# Patient Record
Sex: Female | Born: 1960 | Race: White | Hispanic: No | Marital: Married | Smoking: Never smoker
Health system: Southern US, Community
[De-identification: ages and names within clinical notes are randomized; demographics above are authoritative.]

## PROBLEM LIST (undated history)

## (undated) DIAGNOSIS — G2581 Restless legs syndrome: Secondary | ICD-10-CM

## (undated) DIAGNOSIS — Z87442 Personal history of urinary calculi: Secondary | ICD-10-CM

## (undated) DIAGNOSIS — B019 Varicella without complication: Secondary | ICD-10-CM

## (undated) DIAGNOSIS — N189 Chronic kidney disease, unspecified: Secondary | ICD-10-CM

## (undated) DIAGNOSIS — M549 Dorsalgia, unspecified: Secondary | ICD-10-CM

## (undated) DIAGNOSIS — K219 Gastro-esophageal reflux disease without esophagitis: Secondary | ICD-10-CM

## (undated) DIAGNOSIS — F419 Anxiety disorder, unspecified: Secondary | ICD-10-CM

## (undated) DIAGNOSIS — I10 Essential (primary) hypertension: Secondary | ICD-10-CM

## (undated) DIAGNOSIS — G8929 Other chronic pain: Secondary | ICD-10-CM

## (undated) DIAGNOSIS — E669 Obesity, unspecified: Secondary | ICD-10-CM

## (undated) DIAGNOSIS — F32A Depression, unspecified: Secondary | ICD-10-CM

## (undated) DIAGNOSIS — E785 Hyperlipidemia, unspecified: Secondary | ICD-10-CM

## (undated) DIAGNOSIS — F329 Major depressive disorder, single episode, unspecified: Secondary | ICD-10-CM

## (undated) HISTORY — DX: Chronic kidney disease, unspecified: N18.9

## (undated) HISTORY — DX: Hyperlipidemia, unspecified: E78.5

## (undated) HISTORY — PX: BACK SURGERY: SHX140

## (undated) HISTORY — PX: ABDOMINAL HYSTERECTOMY: SHX81

## (undated) HISTORY — PX: CHOLECYSTECTOMY: SHX55

---

## 2005-01-26 ENCOUNTER — Ambulatory Visit: Payer: Self-pay | Admitting: Unknown Physician Specialty

## 2005-04-20 ENCOUNTER — Ambulatory Visit: Payer: Self-pay | Admitting: Chiropractic Medicine

## 2005-10-17 ENCOUNTER — Inpatient Hospital Stay (HOSPITAL_COMMUNITY): Admission: RE | Admit: 2005-10-17 | Discharge: 2005-10-20 | Payer: Self-pay | Admitting: Neurosurgery

## 2007-06-11 ENCOUNTER — Ambulatory Visit: Payer: Self-pay | Admitting: Internal Medicine

## 2008-01-30 ENCOUNTER — Ambulatory Visit: Payer: Self-pay

## 2008-05-17 ENCOUNTER — Ambulatory Visit: Payer: Self-pay | Admitting: Gastroenterology

## 2008-05-28 ENCOUNTER — Emergency Department: Payer: Self-pay | Admitting: Emergency Medicine

## 2008-05-28 ENCOUNTER — Other Ambulatory Visit: Payer: Self-pay

## 2008-06-10 ENCOUNTER — Ambulatory Visit: Payer: Self-pay | Admitting: Urology

## 2008-06-16 ENCOUNTER — Ambulatory Visit: Payer: Self-pay | Admitting: Urology

## 2009-01-11 ENCOUNTER — Ambulatory Visit: Payer: Self-pay | Admitting: Internal Medicine

## 2009-01-26 ENCOUNTER — Emergency Department: Payer: Self-pay | Admitting: Emergency Medicine

## 2009-08-15 ENCOUNTER — Ambulatory Visit: Payer: Self-pay | Admitting: Chiropractic Medicine

## 2010-03-14 ENCOUNTER — Encounter: Admission: RE | Admit: 2010-03-14 | Discharge: 2010-03-14 | Payer: Self-pay | Admitting: Neurosurgery

## 2010-09-07 ENCOUNTER — Ambulatory Visit: Payer: Self-pay | Admitting: Orthopedic Surgery

## 2011-01-07 ENCOUNTER — Encounter: Payer: Self-pay | Admitting: Neurosurgery

## 2011-05-04 NOTE — Op Note (Signed)
NAMEMARIDEL, PIXLER              ACCOUNT NO.:  [id number]   MEDICAL RECORD NO.:  [id number]          PATIENT TYPE:  INP   LOCATION:  2899                         FACILITY:  [facility]   PHYSICIAN:  Kathaleen Maser. Pool, M.D.    DATE OF BIRTH:  [birth year]   DATE OF PROCEDURE:  10/17/2005  DATE OF DISCHARGE:                                 OPERATIVE REPORT   PREOPERATIVE DIAGNOSES:  L4-5 and L5-S1 degenerative disk disease with  associated stenosis.   POSTOPERATIVE DIAGNOSES:  L4-5 and L5-S1 degenerative disk disease with  associated stenosis.   OPERATION PERFORMED:  L4-5 and L5-S1 decompressive laminectomies with  foraminotomies, more than would be required for simple interbody fusion  alone.  L4-5 and L5-S1 posterior lumbar interbody fusion utilizing Telemon  cages and local autografting.  L4 through S1 posterolateral arthrodesis  utilizing segmental pedicle screw fixation and local autografting.   SURGEON:  Kathaleen Maser. Pool, M.D.   ASSISTANT:  Donalee Citrin, M.D.   ANESTHESIA:  General endotracheal.   INDICATIONS FOR PROCEDURE:  The patient is a 50 year old female with a  history of back and bilateral lower extremity pain, left greater than right  failing conservative management.  Work-up demonstrates evidence of marked  disk degeneration and stenosis at L4-5 and L5-S1.  The patient has failed  conservative management.  She decided to proceed with L4-5 decompression and  fusion and instrumentation in hopes of improving her symptoms.   DESCRIPTION OF PROCEDURE:  The patient was taken to the operating room and  placed on the table in the supine position.  After adequate level of  anesthesia was achieved, the patient was positioned prone onto a Wilson  frame and appropriately padded.  The patient's lumbar region was prepped and  draped sterilely.  A 10 blade was used to make a linear skin incision  overlying the L3, 4, 5, and S1 levels.  This was carried down sharply in the  midline.  A  subperiosteal dissection was then performed exposing the lamina  and facet joints of L3, L4, L5 and S1 as well as the transverse processes of  L4, L5 and the sacral ala bilaterally.  Deep self-retaining retractor was  placed.  Intraoperative fluoroscopy was used and the levels were confirmed.  Decompressive laminectomy was then performed at L4, L5 and S1 using Leksell  rongeurs, Kerrison rongeurs and a high speed drill to completely remove the  lamina of L4, completely remove the lamina of L5 and remove the superior  aspect of the lamina at S1.  All bone was cleaned and used in layer  autografting, complete inferior facetectomies at L4 and L5 were performed  bilaterally.  Complete superior facetectomies of L5 and S1 were performed  bilaterally.  Once again, all bone was cleaned and used in layer autograft.  The ligamentum flavum was then elevated and resected in piecemeal fashion  using Kerrison rongeurs.  The underlying thecal sac and exiting L4, L5 and  S1 nerve roots were identified.  Wide decompressive foraminotomy was then  performed along the course of the exiting nerve roots bilaterally.  The  epidural venous  plexus was coagulated and cut.  Starting first at the  patient's right side, the thecal sac and nerve roots were mobilized and  retracted towards the midline.  Disk space was then incised with a 15 blade  in rectangular fashion.  A wide disk space cleanout was then achieved using  pituitary rongeurs, upward angled pituitary rongeurs and Epstein curettes.  The procedure was then repeated on the contralateral side and then repeated  bilaterally at L5-S1.  Returning back to L4-5, the disk space was distracted  to 8 mm and an 8 mm distractor was left in the patient's right side.  The  thecal sac and nerve roots were protected on the left side.  The disk space  was then reamed with an 8 mm tangent box cutter and then cut with an 8 mm  tangent chisel.  Soft tissues were removed from  the interspace.  An 8 x 26  mm Telemon cage packed with morselized autograft mixed with demineralized  bone cortex was then packed into the interspace and recessed approximately 1  mm from the posterior cortical margin.  Distractor was removed from the  patient's right side.  Thecal sac and nerve roots were protected on the  right side.  Disk space once again reamed and then cut with 8 mm  instruments.  Soft tissue was removed from the interspace.  The disk space  was further curettaged.  Morcellized autograft was packed into the  interspace.  A second 8 x 26 mm Telemon cage was then impacted into place,  recessed approximately 1 mm from the posterior cortical margin.  The  procedure was then repeated bilaterally at L5-S1 once again using interbody  autografting and bilateral Telemon cages 8 x 26 mm.  The pedicles at L4, L5  and S1 were then identified using superficial landmarks and intraoperative  fluoroscopy.  Superficial bone overlying the pedicles then remove using high  speed drill.  Each pedicle was then probed using pedicle awl.  Each pedicle  awl tract was then probed and found to be solidly within bone.  Each pedicle  awl tract was then tapped with a 5.35 mm screw tap.  Each screw tap hole was  probed and found to be solidly within bone.  5.75 x 40 mm Spiral 90D screws  placed bilaterally at L4.  5.75 x 35 mm screws placed bilaterally at L5 and  6.75 x 35 mm screws placed bilaterally at S1.  The transverse processes and  sacral ala were then decorticated using a high speed drill.  Morcellized  autograft was packed posterolaterally for later fusion.  Segments of  titanium rod were then contoured and placed through the screw heads at L4,  L5 and S1.  Locking caps then placed through the screw heads.  Locking caps  were then engaged with the construct under compression.  A transverse  connector was placed.  Final images were taken revealing good position of the bone grafts and  hardware and with proper alignment of the spine.  A  medium Hemovac drain was left in the epidural space.  Gelfoam was also  placed for hemostasis.  The wound was then closed in layers with Vicryl  sutures.  Steri-Strips and sterile dressing were applied.  There were no  apparent complications.  The patient tolerated the procedure well and  returned to the recovery room postoperatively.           ______________________________  Kathaleen Maser Pool, M.D.     HAP/MEDQ  D:  10/17/2005  T:  10/18/2005  Job:  [id number]

## 2011-05-04 NOTE — Discharge Summary (Signed)
NAMESERAYA, JOBST              ACCOUNT NO.:  [id number]   MEDICAL RECORD NO.:  [id number]          PATIENT TYPE:  INP   LOCATION:  3008                         FACILITY:  [facility]   PHYSICIAN:  Kathaleen Maser. Pool, M.D.    DATE OF BIRTH:  1961/10/28   DATE OF ADMISSION:  10/17/2005  DATE OF DISCHARGE:  10/20/2005                                 DISCHARGE SUMMARY   FINAL DIAGNOSIS:  Lumbar vertebrae-4/5, lumbar verterbrae-5/sacral vertebrae-  1 degenerative disease with stenosis.   HISTORY OF PRESENT ILLNESS:  Ms. Peltzer is a 50 year old female with  history of severe back and bilateral lower extremity pain. Workup has  demonstrated evidence of marked disc degeneration with stenosis at L4-5 and  L5-S1. She presents now for two-level decompression and fusion  instrumentation.   HOSPITAL COURSE:  The patient was taken to the operating room were  uncomplicated L4-5, L5-S1 decompression and fusion with instrumentation was  performed. Postoperatively, the patient did relatively well. She had some  residual paresthesias and numbness involving her distal lower extremities.  She had some residual left lower extremity weakness involving her anterior  tibialis which essentially was stable from preoperative state. This  gradually improved. At the time of discharge, the patient is ambulating  without difficulty. She is tolerating regular diet. Her would was healing  well. Pain level was improved from preoperative state. Overall she seems to  be doing well.   CONDITION ON DISCHARGE:  Improved.   DISPOSITION:  The patient will follow-up in my office in one week.           ______________________________  Kathaleen Maser. Pool, M.D.     HAP/MEDQ  D:  12/04/2005  T:  12/05/2005  Job:  [id number]

## 2011-09-10 ENCOUNTER — Emergency Department: Payer: Self-pay | Admitting: *Deleted

## 2012-03-04 ENCOUNTER — Ambulatory Visit: Payer: Self-pay | Admitting: Gastroenterology

## 2013-06-26 ENCOUNTER — Emergency Department (HOSPITAL_COMMUNITY)
Admission: EM | Admit: 2013-06-26 | Discharge: 2013-06-27 | Disposition: A | Discharge status: Home / Self Care | Payer: Federal, State, Local not specified - PPO | Attending: Emergency Medicine | Admitting: Emergency Medicine

## 2013-06-26 ENCOUNTER — Encounter (HOSPITAL_COMMUNITY): Payer: Self-pay | Admitting: Family Medicine

## 2013-06-26 ENCOUNTER — Emergency Department (HOSPITAL_COMMUNITY): Payer: Federal, State, Local not specified - PPO

## 2013-06-26 DIAGNOSIS — M549 Dorsalgia, unspecified: Secondary | ICD-10-CM | POA: Insufficient documentation

## 2013-06-26 DIAGNOSIS — R110 Nausea: Secondary | ICD-10-CM | POA: Insufficient documentation

## 2013-06-26 DIAGNOSIS — R300 Dysuria: Secondary | ICD-10-CM | POA: Insufficient documentation

## 2013-06-26 DIAGNOSIS — N949 Unspecified condition associated with female genital organs and menstrual cycle: Secondary | ICD-10-CM | POA: Insufficient documentation

## 2013-06-26 DIAGNOSIS — Z79899 Other long term (current) drug therapy: Secondary | ICD-10-CM | POA: Insufficient documentation

## 2013-06-26 DIAGNOSIS — N200 Calculus of kidney: Secondary | ICD-10-CM

## 2013-06-26 DIAGNOSIS — G8929 Other chronic pain: Secondary | ICD-10-CM | POA: Insufficient documentation

## 2013-06-26 DIAGNOSIS — I10 Essential (primary) hypertension: Secondary | ICD-10-CM | POA: Insufficient documentation

## 2013-06-26 DIAGNOSIS — R319 Hematuria, unspecified: Secondary | ICD-10-CM | POA: Insufficient documentation

## 2013-06-26 DIAGNOSIS — R6883 Chills (without fever): Secondary | ICD-10-CM | POA: Insufficient documentation

## 2013-06-26 DIAGNOSIS — R197 Diarrhea, unspecified: Secondary | ICD-10-CM | POA: Insufficient documentation

## 2013-06-26 HISTORY — DX: Essential (primary) hypertension: I10

## 2013-06-26 LAB — URINALYSIS, ROUTINE W REFLEX MICROSCOPIC
Bilirubin Urine: NEGATIVE
Glucose, UA: NEGATIVE mg/dL
Leukocytes, UA: NEGATIVE
Nitrite: NEGATIVE
Protein, ur: NEGATIVE mg/dL
Specific Gravity, Urine: 1.027 (ref 1.005–1.030)
pH: 6 (ref 5.0–8.0)

## 2013-06-26 LAB — CBC WITH DIFFERENTIAL/PLATELET
Basophils Absolute: 0 10*3/uL (ref 0.0–0.1)
Basophils Relative: 0 % (ref 0–1)
Eosinophils Absolute: 0.6 10*3/uL (ref 0.0–0.7)
HCT: 37 % (ref 36.0–46.0)
Hemoglobin: 12.5 g/dL (ref 12.0–15.0)
Lymphocytes Relative: 32 % (ref 12–46)
MCH: 29.6 pg (ref 26.0–34.0)
MCHC: 33.8 g/dL (ref 30.0–36.0)
MCV: 87.5 fL (ref 78.0–100.0)
Monocytes Absolute: 0.6 10*3/uL (ref 0.1–1.0)
Monocytes Relative: 6 % (ref 3–12)
Neutro Abs: 5.3 10*3/uL (ref 1.7–7.7)
Neutrophils Relative %: 56 % (ref 43–77)
Platelets: 307 10*3/uL (ref 150–400)
RBC: 4.23 MIL/uL (ref 3.87–5.11)
RDW: 13.5 % (ref 11.5–15.5)
WBC: 9.5 10*3/uL (ref 4.0–10.5)

## 2013-06-26 LAB — COMPREHENSIVE METABOLIC PANEL
ALT: 29 U/L (ref 0–35)
AST: 20 U/L (ref 0–37)
Albumin: 3.6 g/dL (ref 3.5–5.2)
Alkaline Phosphatase: 65 U/L (ref 39–117)
BUN: 19 mg/dL (ref 6–23)
Chloride: 102 mEq/L (ref 96–112)
Creatinine, Ser: 0.82 mg/dL (ref 0.50–1.10)
Glucose, Bld: 104 mg/dL — ABNORMAL HIGH (ref 70–99)
Potassium: 3.5 mEq/L (ref 3.5–5.1)
Sodium: 139 mEq/L (ref 135–145)
Total Bilirubin: 0.8 mg/dL (ref 0.3–1.2)
Total Protein: 6.8 g/dL (ref 6.0–8.3)

## 2013-06-26 LAB — URINE MICROSCOPIC-ADD ON

## 2013-06-26 MED ORDER — HYDROMORPHONE HCL PF 1 MG/ML IJ SOLN
1.0000 mg | Freq: Once | INTRAMUSCULAR | Status: AC
  Administered 2013-06-26: 1 mg via INTRAMUSCULAR
  Filled 2013-06-26: qty 1

## 2013-06-26 MED ORDER — TAMSULOSIN HCL 0.4 MG PO CAPS: 0.4000 mg | ORAL_CAPSULE | Freq: Every day | ORAL | Status: DC

## 2013-06-26 MED ORDER — HYDROCODONE-ACETAMINOPHEN 5-325 MG PO TABS: 1.0000 | ORAL_TABLET | ORAL | Status: DC | PRN

## 2013-06-26 MED ORDER — ONDANSETRON HCL 4 MG PO TABS: 4.0000 mg | ORAL_TABLET | Freq: Four times a day (QID) | ORAL | Status: DC

## 2013-06-26 NOTE — ED Notes (Signed)
Patient states that she has had abdominal pain x 1 week; pain worse the past 3 days. States pain radiates into pelvic area. Report nausea and pain with urination. Denies vaginal discharge.

## 2013-06-26 NOTE — ED Provider Notes (Signed)
History    CSN: [id number] Arrival date & time 06/26/13  Brittany Cain  First MD Initiated Contact with Patient 06/26/13 2209     Chief Complaint  Patient presents with  . Abdominal Pain   (Consider location/radiation/quality/duration/timing/severity/associated sxs/prior Treatment) HPI  Patient is a 52 year old female past medical history significant for hypertension, chronic back pain presenting to the emergency department for worsening right-sided flank pain with radiation into right pelvic region. Patient describes her pain as waxing and waning dull pain. She rates her pain 6/10. She is having associated nausea, diarrhea, hematuria, and dysuria but denies any fevers, chills, chest pain, shortness of breath. Patient has history of kidney stones.   Past Medical History  Diagnosis Date  . Hypertension    Past Surgical History  Procedure Laterality Date  . Abdominal hysterectomy    . Cholecystectomy    . Back surgery     No family history on file. History  Substance Use Topics  . Smoking status: Never Smoker   . Smokeless tobacco: Not on file  . Alcohol Use: No   OB History   Grav Para Term Preterm Abortions TAB SAB Ect Mult Living                 Review of Systems  Constitutional: Positive for chills.  HENT: Negative.   Eyes: Negative.   Respiratory: Negative for shortness of breath.   Cardiovascular: Negative for chest pain.  Gastrointestinal: Positive for nausea, abdominal pain and diarrhea. Negative for vomiting.  Genitourinary: Positive for dysuria, hematuria, flank pain and pelvic pain. Negative for decreased urine volume, vaginal bleeding, vaginal discharge and vaginal pain.  Musculoskeletal: Negative.   Skin: Negative.   Neurological: Negative.     Allergies  Review of patient's allergies indicates no known allergies.  Home Medications   Current Outpatient Rx  Name  Route  Sig  Dispense  Refill  . aspirin-acetaminophen-caffeine (EXCEDRIN MIGRAINE) 250-250-65  MG per tablet   Oral   Take 1 tablet by mouth every 6 (six) hours as needed for pain. Pamprin         . HYDROcodone-acetaminophen (NORCO/VICODIN) 5-325 MG per tablet   Oral   Take 0.5 tablets by mouth 2 (two) times daily as needed for pain.         Marland Kitchen ibuprofen (ADVIL,MOTRIN) 200 MG tablet   Oral   Take 400 mg by mouth every 6 (six) hours as needed for pain.         Marland Kitchen lisinopril (PRINIVIL,ZESTRIL) 20 MG tablet   Oral   Take 20 mg by mouth every morning.         . naproxen sodium (ANAPROX) 220 MG tablet   Oral   Take 220 mg by mouth 2 (two) times daily as needed (pain).         Marland Kitchen PARoxetine (PAXIL) 20 MG tablet   Oral   Take 30 mg by mouth every evening.         . RABEprazole (ACIPHEX) 20 MG tablet   Oral   Take 20 mg by mouth every morning.         Marland Kitchen HYDROcodone-acetaminophen (NORCO/VICODIN) 5-325 MG per tablet   Oral   Take 1 tablet by mouth every 4 (four) hours as needed for pain.   12 tablet   0   . ondansetron (ZOFRAN) 4 MG tablet   Oral   Take 1 tablet (4 mg total) by mouth every 6 (six) hours.   12 tablet   0   .  tamsulosin (FLOMAX) 0.4 MG CAPS   Oral   Take 1 capsule (0.4 mg total) by mouth daily.   10 capsule   0    BP 116/64  Pulse 60  Temp(Src) 98.7 F (37.1 C) (Oral)  Resp 18  Ht 5\' 2"  (1.575 m)  Wt 225 lb (102.059 kg)  BMI 41.14 kg/m2  SpO2 99% Physical Exam  Constitutional: She is oriented to person, place, and time. She appears well-developed and well-nourished. No distress.  HENT:  Head: Normocephalic and atraumatic.  Mouth/Throat: Oropharynx is clear and moist.  Eyes: Conjunctivae and EOM are normal.  Neck: Normal range of motion. Neck supple.  Cardiovascular: Normal rate, regular rhythm and normal heart sounds.   Pulmonary/Chest: Effort normal and breath sounds normal. No respiratory distress.  Abdominal: Soft. Bowel sounds are normal. There is no tenderness. There is no rigidity, no rebound, no guarding and no CVA  tenderness.  Neurological: She is alert and oriented to person, place, and time.  Skin: Skin is warm and dry. She is not diaphoretic.  Psychiatric: She has a normal mood and affect.    ED Course  Procedures (including critical care time) Labs Reviewed  URINALYSIS, ROUTINE W REFLEX MICROSCOPIC - Abnormal; Notable for the following:    Hgb urine dipstick LARGE (*)    All other components within normal limits  URINE MICROSCOPIC-ADD ON - Abnormal; Notable for the following:    Squamous Epithelial / LPF FEW (*)    All other components within normal limits  CBC WITH DIFFERENTIAL - Abnormal; Notable for the following:    Eosinophils Relative 6 (*)    All other components within normal limits  COMPREHENSIVE METABOLIC PANEL - Abnormal; Notable for the following:    Glucose, Bld 104 (*)    GFR calc non Af Amer 81 (*)    All other components within normal limits   Ct Abdomen Pelvis Wo Contrast  06/26/2013   *RADIOLOGY REPORT*  Clinical Data: Right sided flank pain.  History of stones.  CT ABDOMEN AND PELVIS WITHOUT CONTRAST  Technique:  Multidetector CT imaging of the abdomen and pelvis was performed following the standard protocol without intravenous contrast.  Comparison: None.  Findings: Minimal dependent changes in the lung bases.  There are punctate sized intrarenal stones in the lower poles of both kidneys, too on the left and one on the right.  There is no pyelocaliectasis or ureterectasis.  There is a 5 mm stone in the distal right ureter just above the ureterovesicle junction.  No left ureteral or bladder stones demonstrated.  No bladder wall thickening.  The surgical absence of the gallbladder.  The unenhanced appearance of the liver, spleen, pancreas, adrenal glands, abdominal aorta, inferior vena cava, and retroperitoneal lymph nodes is unremarkable.  The stomach and small bowel are decompressed.  Stool filled colon without distension.  Minimal umbilical hernia containing fat.  Prominent  visceral adipose tissues.  No free air or free fluid in the abdomen.  Pelvis:  A the uterus appears to be surgically absent.  No abnormal adnexal masses.  Surgical clips in the low pelvis consistent with postoperative change, likely hernia repair.  The appendix is normal.  There are scattered diverticula in the sigmoid colon without evidence of diverticulitis.  No free or loculated pelvic fluid collections.  No significant pelvic lymphadenopathy. Postoperative changes with posterior and intervertebral fusion from L4 to the sacrum.  Mild degenerative changes in the lumbar spine.  IMPRESSION: 5 mm stone in the distal right ureter  without significant obstruction proximally.  Tiny nonobstructing intrarenal stones bilaterally.   Original Report Authenticated By: Burman Nieves, M.D.   1. Kidney stone     MDM  Abdomen S/NT/ND w/ good bowel sounds. Pt has been diagnosed with a Kidney Stone via CT. There is no evidence of significant hydronephrosis, serum creatine WNL, vitals sign stable and the pt does not have irratractable vomiting. Pt will be dc home with pain medications & has been advised to follow up with PCP. Patient d/w with Dr. Jeraldine Loots, agrees with plan. Patient is stable at time of discharge     Jeannetta Ellis, PA-C 06/27/13 0116

## 2013-06-27 NOTE — ED Provider Notes (Signed)
  Medical screening examination/treatment/procedure(s) were performed by non-physician practitioner and as supervising physician I was immediately available for consultation/collaboration.  I interpreted the CT scan with the mid-level provider.  We discussed the patient's case at length.  Gerhard Munch, MD 06/27/13 [phone]

## 2013-07-14 ENCOUNTER — Emergency Department: Payer: Self-pay | Admitting: Emergency Medicine

## 2013-07-14 LAB — CBC
HCT: 35.7 % (ref 35.0–47.0)
MCV: 89 fL (ref 80–100)
RBC: 4.02 10*6/uL (ref 3.80–5.20)
RDW: 12.8 % (ref 11.5–14.5)
WBC: 10.3 10*3/uL (ref 3.6–11.0)

## 2013-07-14 LAB — BASIC METABOLIC PANEL
Anion Gap: 4 — ABNORMAL LOW (ref 7–16)
BUN: 17 mg/dL (ref 7–18)
Calcium, Total: 8.7 mg/dL (ref 8.5–10.1)
Chloride: 106 mmol/L (ref 98–107)
Co2: 29 mmol/L (ref 21–32)
Creatinine: 1.11 mg/dL (ref 0.60–1.30)
EGFR (African American): >60
EGFR (Non-African Amer.): 57 — ABNORMAL LOW
Osmolality: 279 (ref 275–301)
Potassium: 4.1 mmol/L (ref 3.5–5.1)
Sodium: 139 mmol/L (ref 136–145)

## 2013-07-14 LAB — URINALYSIS, COMPLETE
Ketone: NEGATIVE
Leukocyte Esterase: NEGATIVE
Nitrite: NEGATIVE
Ph: 6 (ref 4.5–8.0)
Protein: NEGATIVE
RBC,UR: 5 /HPF (ref 0–5)
WBC UR: NONE SEEN /HPF (ref 0–5)

## 2013-07-16 ENCOUNTER — Ambulatory Visit: Payer: Self-pay | Admitting: Urology

## 2013-10-19 ENCOUNTER — Other Ambulatory Visit: Payer: Self-pay | Admitting: Orthopedic Surgery

## 2013-10-19 DIAGNOSIS — M25512 Pain in left shoulder: Secondary | ICD-10-CM

## 2013-10-29 ENCOUNTER — Other Ambulatory Visit: Payer: Self-pay | Admitting: Urology

## 2013-10-29 ENCOUNTER — Observation Stay: Payer: Self-pay | Admitting: Family Medicine

## 2013-10-29 LAB — BASIC METABOLIC PANEL
Anion Gap: 2 — ABNORMAL LOW (ref 7–16)
Co2: 30 mmol/L (ref 21–32)
Creatinine: 1 mg/dL (ref 0.60–1.30)
EGFR (African American): >60
Glucose: 90 mg/dL (ref 65–99)

## 2013-10-29 LAB — CBC
HGB: 12.4 g/dL (ref 12.0–16.0)
MCH: 30.1 pg (ref 26.0–34.0)
MCV: 89 fL (ref 80–100)
Platelet: 331 10*3/uL (ref 150–440)
RBC: 4.11 10*6/uL (ref 3.80–5.20)
WBC: 8.1 10*3/uL (ref 3.6–11.0)

## 2013-10-29 LAB — TROPONIN I
Troponin-I: <0.02 ng/mL
Troponin-I: <0.02 ng/mL

## 2013-10-29 LAB — CK TOTAL AND CKMB (NOT AT ARMC)
CK, Total: 52 U/L (ref 21–215)
CK-MB: 0.9 ng/mL (ref 0.5–3.6)

## 2013-11-02 ENCOUNTER — Ambulatory Visit
Admission: RE | Admit: 2013-11-02 | Discharge: 2013-11-02 | Disposition: A | Discharge status: Home / Self Care | Payer: Federal, State, Local not specified - PPO | Source: Ambulatory Visit | Attending: Orthopedic Surgery | Admitting: Orthopedic Surgery

## 2013-11-02 DIAGNOSIS — M25512 Pain in left shoulder: Secondary | ICD-10-CM

## 2013-11-02 MED ORDER — IOHEXOL 180 MG/ML  SOLN
13.0000 mL | Freq: Once | INTRAMUSCULAR | Status: AC | PRN
  Administered 2013-11-02: 13 mL via INTRA_ARTICULAR

## 2013-11-05 ENCOUNTER — Ambulatory Visit: Payer: Self-pay | Admitting: Urology

## 2014-03-24 ENCOUNTER — Encounter (HOSPITAL_COMMUNITY): Payer: Self-pay | Admitting: Emergency Medicine

## 2014-03-24 DIAGNOSIS — Z79899 Other long term (current) drug therapy: Secondary | ICD-10-CM | POA: Insufficient documentation

## 2014-03-24 DIAGNOSIS — F3289 Other specified depressive episodes: Secondary | ICD-10-CM | POA: Insufficient documentation

## 2014-03-24 DIAGNOSIS — F329 Major depressive disorder, single episode, unspecified: Secondary | ICD-10-CM | POA: Insufficient documentation

## 2014-03-24 DIAGNOSIS — I10 Essential (primary) hypertension: Secondary | ICD-10-CM | POA: Insufficient documentation

## 2014-03-24 DIAGNOSIS — Z791 Long term (current) use of non-steroidal anti-inflammatories (NSAID): Secondary | ICD-10-CM | POA: Insufficient documentation

## 2014-03-24 DIAGNOSIS — M549 Dorsalgia, unspecified: Secondary | ICD-10-CM | POA: Insufficient documentation

## 2014-03-24 DIAGNOSIS — G2581 Restless legs syndrome: Secondary | ICD-10-CM | POA: Insufficient documentation

## 2014-03-24 NOTE — ED Notes (Signed)
Pt states she has not been feeling well for last year, and only getting 2-21/2 hours of sleep. States she has been falling asleep while driving and at work. Suffers from depression. Has no current plan to hurt herself.

## 2014-03-25 ENCOUNTER — Encounter (HOSPITAL_COMMUNITY): Payer: Self-pay | Admitting: General Practice

## 2014-03-25 ENCOUNTER — Inpatient Hospital Stay (HOSPITAL_COMMUNITY)
Admission: AD | Admit: 2014-03-25 | Discharge: 2014-03-31 | DRG: 885 | Disposition: A | Discharge status: Home / Self Care | Payer: Federal, State, Local not specified - PPO | Source: Intra-hospital | Attending: Psychiatry | Admitting: Psychiatry

## 2014-03-25 ENCOUNTER — Emergency Department (HOSPITAL_COMMUNITY)
Admission: EM | Admit: 2014-03-25 | Discharge: 2014-03-25 | Disposition: A | Discharge status: Other Acute Inpatient Hospital | Payer: Federal, State, Local not specified - PPO | Attending: Emergency Medicine | Admitting: Emergency Medicine

## 2014-03-25 DIAGNOSIS — F32A Depression, unspecified: Secondary | ICD-10-CM

## 2014-03-25 DIAGNOSIS — F411 Generalized anxiety disorder: Secondary | ICD-10-CM | POA: Diagnosis present

## 2014-03-25 DIAGNOSIS — G2581 Restless legs syndrome: Secondary | ICD-10-CM | POA: Diagnosis present

## 2014-03-25 DIAGNOSIS — F329 Major depressive disorder, single episode, unspecified: Secondary | ICD-10-CM

## 2014-03-25 DIAGNOSIS — F332 Major depressive disorder, recurrent severe without psychotic features: Secondary | ICD-10-CM

## 2014-03-25 DIAGNOSIS — R45851 Suicidal ideations: Secondary | ICD-10-CM

## 2014-03-25 DIAGNOSIS — F4310 Post-traumatic stress disorder, unspecified: Secondary | ICD-10-CM | POA: Diagnosis present

## 2014-03-25 DIAGNOSIS — G4700 Insomnia, unspecified: Secondary | ICD-10-CM | POA: Diagnosis present

## 2014-03-25 DIAGNOSIS — F410 Panic disorder [episodic paroxysmal anxiety] without agoraphobia: Secondary | ICD-10-CM | POA: Diagnosis present

## 2014-03-25 DIAGNOSIS — I10 Essential (primary) hypertension: Secondary | ICD-10-CM | POA: Diagnosis present

## 2014-03-25 LAB — CBC WITH DIFFERENTIAL/PLATELET
Basophils Absolute: 0.1 10*3/uL (ref 0.0–0.1)
Basophils Relative: 1 % (ref 0–1)
Eosinophils Absolute: 0.6 10*3/uL (ref 0.0–0.7)
Eosinophils Relative: 8 % — ABNORMAL HIGH (ref 0–5)
HCT: 36.9 % (ref 36.0–46.0)
Hemoglobin: 12.5 g/dL (ref 12.0–15.0)
Lymphocytes Relative: 44 % (ref 12–46)
Lymphs Abs: 3.4 10*3/uL (ref 0.7–4.0)
MCH: 29.6 pg (ref 26.0–34.0)
MCHC: 33.9 g/dL (ref 30.0–36.0)
MCV: 87.4 fL (ref 78.0–100.0)
Monocytes Absolute: 0.5 10*3/uL (ref 0.1–1.0)
Monocytes Relative: 6 % (ref 3–12)
Neutro Abs: 3.3 10*3/uL (ref 1.7–7.7)
Neutrophils Relative %: 41 % — ABNORMAL LOW (ref 43–77)
Platelets: 309 10*3/uL (ref 150–400)
RBC: 4.22 MIL/uL (ref 3.87–5.11)
RDW: 13 % (ref 11.5–15.5)
WBC: 7.9 10*3/uL (ref 4.0–10.5)

## 2014-03-25 LAB — I-STAT CHEM 8, ED
BUN: 16 mg/dL (ref 6–23)
Calcium, Ion: 1.21 mmol/L (ref 1.12–1.23)
Chloride: 101 mEq/L (ref 96–112)
Creatinine, Ser: 0.9 mg/dL (ref 0.50–1.10)
Glucose, Bld: 98 mg/dL (ref 70–99)
HCT: 38 % (ref 36.0–46.0)
Hemoglobin: 12.9 g/dL (ref 12.0–15.0)
Potassium: 3.7 mEq/L (ref 3.7–5.3)
Sodium: 140 mEq/L (ref 137–147)
TCO2: 29 mmol/L (ref 0–100)

## 2014-03-25 MED ORDER — MAGNESIUM HYDROXIDE 400 MG/5ML PO SUSP: 30.0000 mL | Freq: Every day | ORAL | Status: DC | PRN

## 2014-03-25 MED ORDER — HYDROXYZINE HCL 25 MG PO TABS
25.0000 mg | ORAL_TABLET | Freq: Four times a day (QID) | ORAL | Status: DC | PRN
Start: 2014-03-25 — End: 2014-03-31
  Administered 2014-03-25 – 2014-03-28 (×2): 25 mg via ORAL
  Filled 2014-03-25 (×2): qty 1

## 2014-03-25 MED ORDER — CITALOPRAM HYDROBROMIDE 20 MG PO TABS
20.0000 mg | ORAL_TABLET | Freq: Every day | ORAL | Status: DC
  Administered 2014-03-25 – 2014-03-30 (×6): 20 mg via ORAL
  Filled 2014-03-25 (×8): qty 1

## 2014-03-25 MED ORDER — TRAZODONE HCL 100 MG PO TABS
100.0000 mg | ORAL_TABLET | Freq: Every evening | ORAL | Status: DC | PRN
  Administered 2014-03-25 – 2014-03-30 (×6): 100 mg via ORAL
  Filled 2014-03-25: qty 3
  Filled 2014-03-25 (×6): qty 1

## 2014-03-25 MED ORDER — LORAZEPAM 1 MG PO TABS
1.0000 mg | ORAL_TABLET | Freq: Once | ORAL | Status: AC
  Administered 2014-03-25: 1 mg via ORAL
  Filled 2014-03-25: qty 1

## 2014-03-25 MED ORDER — METHOCARBAMOL 750 MG PO TABS
750.0000 mg | ORAL_TABLET | Freq: Three times a day (TID) | ORAL | Status: DC | PRN
  Administered 2014-03-26 – 2014-03-31 (×7): 750 mg via ORAL
  Filled 2014-03-25 (×7): qty 1

## 2014-03-25 MED ORDER — ROPINIROLE HCL 0.25 MG PO TABS
0.2500 mg | ORAL_TABLET | Freq: Every day | ORAL | Status: DC
  Administered 2014-03-25 – 2014-03-30 (×6): 0.25 mg via ORAL
  Filled 2014-03-25 (×7): qty 1
  Filled 2014-03-25: qty 3
  Filled 2014-03-25: qty 1

## 2014-03-25 MED ORDER — IBUPROFEN 600 MG PO TABS
600.0000 mg | ORAL_TABLET | Freq: Four times a day (QID) | ORAL | Status: DC | PRN
  Administered 2014-03-25 – 2014-03-30 (×11): 600 mg via ORAL
  Filled 2014-03-25 (×11): qty 1

## 2014-03-25 MED ORDER — ACETAMINOPHEN 325 MG PO TABS
650.0000 mg | ORAL_TABLET | Freq: Four times a day (QID) | ORAL | Status: DC | PRN
  Administered 2014-03-28: 650 mg via ORAL
  Filled 2014-03-25: qty 2

## 2014-03-25 MED ORDER — ALUM & MAG HYDROXIDE-SIMETH 200-200-20 MG/5ML PO SUSP: 30.0000 mL | ORAL | Status: DC | PRN

## 2014-03-25 MED ORDER — PANTOPRAZOLE SODIUM 40 MG PO TBEC
40.0000 mg | DELAYED_RELEASE_TABLET | Freq: Every day | ORAL | Status: DC
  Administered 2014-03-26 – 2014-03-31 (×6): 40 mg via ORAL
  Filled 2014-03-25 (×8): qty 1

## 2014-03-25 MED ORDER — LISINOPRIL 20 MG PO TABS
20.0000 mg | ORAL_TABLET | Freq: Every morning | ORAL | Status: DC
Start: 2014-03-26 — End: 2014-03-31
  Administered 2014-03-26 – 2014-03-31 (×5): 20 mg via ORAL
  Filled 2014-03-25 (×8): qty 1

## 2014-03-25 NOTE — Tx Team (Signed)
Initial Interdisciplinary Treatment Plan  PATIENT STRENGTHS: (choose at least two) Active sense of humor General fund of knowledge  PATIENT STRESSORS: Health problems Marital or family conflict   PROBLEM LIST: Problem List/Patient Goals Date to be addressed Date deferred Reason deferred Estimated date of resolution  Depression 03/25/2014           Risk for suicide 03/25/2014           Insomnia 03/25/2014                              DISCHARGE CRITERIA:  Ability to meet basic life and health needs Improved stabilization in mood, thinking, and/or behavior  PRELIMINARY DISCHARGE PLAN: Attend PHP/IOP Outpatient therapy  PATIENT/FAMIILY INVOLVEMENT: This treatment plan has been presented to and reviewed with the patient, Brittany Cain.  The patient and family have been given the opportunity to ask questions and make suggestions.  Terin Dierolf E Ennio Houp 03/25/2014, 4:03 PM
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
## 2014-03-25 NOTE — ED Provider Notes (Signed)
CSN: [id number]     Arrival date & time 03/24/14  1852 History   First MD Initiated Contact with Patient 03/25/14 0133     Chief Complaint  Patient presents with  . Insomnia     (Consider location/radiation/quality/duration/timing/severity/associated sxs/prior Treatment) HPI Comments: Since changing medications from Wellbutrin to Paxil has had insomnia and restless leg syndrome, has only been getting 2-3 hours of interupted sleep nightly and it is now affecting her work abilities.  Has tried melatonin,    The history is provided by the patient.    Past Medical History  Diagnosis Date  . Hypertension    Past Surgical History  Procedure Laterality Date  . Abdominal hysterectomy    . Cholecystectomy    . Back surgery     History reviewed. No pertinent family history. History  Substance Use Topics  . Smoking status: Never Smoker   . Smokeless tobacco: Never Used  . Alcohol Use: No   OB History   Grav Para Term Preterm Abortions TAB SAB Ect Mult Living                 Review of Systems  Constitutional: Negative for fever.  Respiratory: Negative for cough.   Genitourinary: Negative for dysuria.  Musculoskeletal: Positive for back pain.  Skin: Negative for rash.  Neurological: Negative for dizziness, weakness, numbness and headaches.  Psychiatric/Behavioral: Negative for suicidal ideas.  All other systems reviewed and are negative.     Allergies  Ceftin  Home Medications   Current Outpatient Rx  Name  Route  Sig  Dispense  Refill  . celecoxib (CELEBREX) 200 MG capsule   Oral   Take 1 capsule (200 mg total) by mouth 2 (two) times daily.   60 capsule   0   . citalopram (CELEXA) 10 MG tablet   Oral   Take 3 tablets (30 mg total) by mouth daily.   90 tablet   0   . lisinopril (PRINIVIL,ZESTRIL) 20 MG tablet   Oral   Take 1 tablet (20 mg total) by mouth every morning.         . RABEprazole (ACIPHEX) 20 MG tablet   Oral   Take 1 tablet (20 mg total) by  mouth every morning.         Marland Kitchen. rOPINIRole (REQUIP) 0.25 MG tablet   Oral   Take 1 tablet (0.25 mg total) by mouth at bedtime.   30 tablet   0   . traZODone (DESYREL) 100 MG tablet   Oral   Take 1 tablet (100 mg total) by mouth at bedtime as needed for sleep.   30 tablet   0    BP 121/56  Pulse 76  Temp(Src) 97.6 F (36.4 C) (Oral)  Resp 16  Ht 5\' 1"  (1.549 m)  Wt 227 lb 8 oz (103.193 kg)  BMI 43.01 kg/m2  SpO2 99% Physical Exam  Nursing note and vitals reviewed. Constitutional: She is oriented to person, place, and time. She appears well-developed and well-nourished.  HENT:  Head: Normocephalic.  Eyes: Pupils are equal, round, and reactive to light.  Neck: Normal range of motion.  Cardiovascular: Normal rate.   Musculoskeletal: She exhibits no edema and no tenderness.  Neurological: She is alert and oriented to person, place, and time.  Skin: Skin is warm.  Psychiatric: Her behavior is normal. Judgment and thought content normal. Cognition and memory are normal. She exhibits a depressed mood. She expresses no suicidal ideation. She expresses no suicidal plans.  ED Course  Procedures (including critical care time) Labs Review Labs Reviewed  CBC WITH DIFFERENTIAL - Abnormal; Notable for the following:    Neutrophils Relative % 41 (*)    Eosinophils Relative 8 (*)    All other components within normal limits  I-STAT CHEM 8, ED   Imaging Review No results found.   EKG Interpretation None      MDM   Final diagnoses:  Depression        Arman Filter, NP 03/31/14 2009

## 2014-03-25 NOTE — ED Notes (Signed)
TTS set up at bedside. 

## 2014-03-25 NOTE — ED Notes (Signed)
Communication with [facility] for TTS eval. To see patient shortly.

## 2014-03-25 NOTE — Progress Notes (Signed)
The patient attended karaoke group this evening and displayed appropriate behavior. She sang one song and had a snack.

## 2014-03-25 NOTE — Progress Notes (Signed)
Recreation Therapy Notes  Animal-Assisted Activity/Therapy (AAA/T) Program Checklist/Progress Notes Patient Eligibility Criteria Checklist & Daily Group note for Rec Tx Intervention  Date: 04.09.2015 Time: 2:45pm Location: [address] Hall Dayroom    AAA/T Program Assumption of Risk Form signed by Patient/ or Parent Legal Guardian yes  Patient is free of allergies or sever asthma yes  Patient reports no fear of animals yes  Patient reports no history of cruelty to animals yes   Patient understands his/her participation is voluntary yes  Patient washes hands before animal contact yes  Patient washes hands after animal contact yes  Behavioral Response: Appropriate   Education: Hand Washing, Appropriate Animal Interaction   Education Outcome: Acknowledges understanding   Clinical Observations/Feedback: Patient interacted appropriately with peers and dog team during session.    Jaynie Hitch L Rembert Browe, LRT/CTRS  Kaelan Amble L Devontay Celaya 03/25/2014 4:05 PM 

## 2014-03-25 NOTE — ED Notes (Signed)
Patient explains that she is here for persistent insomnia, fatigue, and restless legs lasting approximately a year. Had anti-depressant medication changed at that time from Wellbutrin to Paxil. Goes on to explain that PCP would not prescribe medications to help her with sleep and recommended that she get some exercise to solve the problems she is experiencing. Reported symptoms are common side effects of Paxil.

## 2014-03-25 NOTE — Progress Notes (Signed)
Patient ID: Brittany PeerDonna Cain, female   DOB: 07-May-1961, 53 y.o.   MRN: [id number]  Brittany PeerDonna Oh is a 53 year old [facility]. Patient has been experiencing several days of insomnia patient states is related to her restless leg syndrome. Patient also states her chronic lower back pain is a contributor. Patient denies SI/HI and A/V hallucinations at the moment but reports that if she does have SI she can contract for safety. Patient oriented to the unit and verbalized understanding of the admission process. Q15 minute safety checks were initiated and are being maintained. No distress noted upon admission.

## 2014-03-25 NOTE — ED Notes (Signed)
Case management Doris at bedside

## 2014-03-25 NOTE — ED Provider Notes (Signed)
10:15- she continues to await a PTS consultation. She is here for depressive symptoms, and insomnia that is exacerbated when she was placed on Paxil, by her PCP. He, states that she was able to sleep, some "this morning." There has been no reported suicidal or homicidal ideation. The patient appears stable for discharge with outpatient management for her depressive symptoms. We'll seek discharged following her consultation with TTS.   12:40- TTS, [facility]  Flint MelterElliott L Sion Reinders, MD 03/25/14 1243

## 2014-03-26 DIAGNOSIS — F411 Generalized anxiety disorder: Secondary | ICD-10-CM

## 2014-03-26 DIAGNOSIS — R45851 Suicidal ideations: Secondary | ICD-10-CM

## 2014-03-26 DIAGNOSIS — F332 Major depressive disorder, recurrent severe without psychotic features: Principal | ICD-10-CM

## 2014-03-26 MED ORDER — PAROXETINE HCL 10 MG PO TABS
10.0000 mg | ORAL_TABLET | Freq: Every day | ORAL | Status: DC
  Administered 2014-03-26 – 2014-03-30 (×5): 10 mg via ORAL
  Filled 2014-03-26 (×6): qty 1

## 2014-03-26 MED ORDER — TRAZODONE HCL 50 MG PO TABS
50.0000 mg | ORAL_TABLET | Freq: Once | ORAL | Status: AC
  Administered 2014-03-26: 50 mg via ORAL
  Filled 2014-03-26 (×2): qty 1
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
## 2014-03-26 NOTE — BHH Suicide Risk Assessment (Signed)
Suicide Risk Assessment  Admission Assessment     Nursing information obtained from:  Patient Demographic factors:  Caucasian Current Mental Status:  NA Loss Factors:  Decline in physical health Historical Factors:  Family history of mental illness or substance abuse Risk Reduction Factors:  Living with another person, especially a relative Total Time spent with patient: 45 minutes  CLINICAL FACTORS:   Severe Anxiety and/or Agitation Panic Attacks Depression:   Anhedonia Hopelessness Impulsivity Insomnia Recent sense of peace/wellbeing Severe Chronic Pain Previous Psychiatric Diagnoses and Treatments Medical Diagnoses and Treatments/Surgeries  COGNITIVE FEATURES THAT CONTRIBUTE TO RISK:  Closed-mindedness Loss of executive function Polarized thinking    SUICIDE RISK:   Moderate:  Frequent suicidal ideation with limited intensity, and duration, some specificity in terms of plans, no associated intent, good self-control, limited dysphoria/symptomatology, some risk factors present, and identifiable protective factors, including available and accessible social support.  PLAN OF CARE: Admit for depression, anxiety and suicidal thoughts. She needs crisis stabilization, safety monitoring and medication management.   I certify that inpatient services furnished can reasonably be expected to improve the patient's condition.  Randal BubaJanardhaha R Melayah Skorupski 03/26/2014, 10:57 AM

## 2014-03-26 NOTE — Progress Notes (Signed)
D) Pt has been attending the groups and interacting with her peers. Rates her depression and hopelessness both at a 5 and denies SI and HI. States taht she is really having trouble with sleeping at home and feels totally overwhelmed by this. Affect is flat and mood depressed. A) Given support and reassurance. Encouraged to let us know how she is sleep so that we can work on her sleep cycle while in the hospital. R) Denies SI and HI.

## 2014-03-26 NOTE — [facility] Group Notes (Signed)
[facility]   03/26/2014 12:50 PM    Participation Quality:  Appropraite  Mood/Affect:  Appropriate  Depression Rating:  5  Anxiety Rating:  9  Thoughts of Suicide:  No  Will you contract for safety?   NA  Current AVH:  No  Plan for Discharge/Comments:  Patient attended discharge planning group and actively participated in group.  She advised of not having outpatient services and will need to be assited with a referral.  CSW provided all participants with daily workbook.   Transportation Means: Patient has transportation.   Supports:  Patient has a support system.   Brittany Cain, Brittany Cain
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
## 2014-03-26 NOTE — [facility] Group Notes (Signed)
[facility] LCSW Group Therapy  Feelings Around Relapse 1:15 -2:30        03/26/2014  3:53 PM   Type of Therapy:  Group Therapy  Participation Level:  Appropriate  Participation Quality:  Appropriate  Affect:  Appropriate  Cognitive:  Attentive Appropriate  Insight:  Developing/Improving  Engagement in Therapy: Developing/Improving  Modes of Intervention:  Discussion Exploration Problem-Solving Supportive  Summary of Progress/Problems:  The topic for today was feelings around relapse.    Patient processed feelings toward relapse and was able to relate to peers. She share relapse for her is thinking she does not matter to anyone which leads to her becoming very depressed.  Patient identified coping skills that can be used to prevent a relapse.   [name], Marli Diego Hairston 03/26/2014 3:53 PM

## 2014-03-26 NOTE — Progress Notes (Signed)
Pt asking for additional sleep meds at around 0100. States additional trazadone might help. Also complaining of back pain of a 6/10 and states she normally takes flexeril. Consulted with Annie MainF Hobson NP who ordered a one time trazadone 50mg  which was given. Pt also given robaxin for the back pain per admit orders. Encouraged pt to speak with MD regarding sleep meds when she is seen tomorrow. On reassess, pt was asleep. She remains safe resting in bed. Levell JulyMarian Eakes Zachery ConchFriedman

## 2014-03-27 MED ORDER — LOPERAMIDE HCL 2 MG PO CAPS: 2.0000 mg | ORAL_CAPSULE | ORAL | Status: DC | PRN

## 2014-03-27 MED ORDER — ONDANSETRON 4 MG PO TBDP
4.0000 mg | ORAL_TABLET | Freq: Four times a day (QID) | ORAL | Status: DC | PRN
  Administered 2014-03-27: 4 mg via ORAL
  Filled 2014-03-27: qty 1

## 2014-03-27 MED ORDER — CLONIDINE HCL 0.1 MG PO TABS
0.1000 mg | ORAL_TABLET | Freq: Four times a day (QID) | ORAL | Status: AC
  Administered 2014-03-27 – 2014-03-29 (×7): 0.1 mg via ORAL
  Filled 2014-03-27 (×11): qty 1

## 2014-03-27 MED ORDER — CLONIDINE HCL 0.1 MG PO TABS
0.1000 mg | ORAL_TABLET | Freq: Every day | ORAL | Status: DC
  Filled 2014-03-27: qty 1

## 2014-03-27 MED ORDER — CLONIDINE HCL 0.1 MG PO TABS
0.1000 mg | ORAL_TABLET | ORAL | Status: DC
  Administered 2014-03-30: 0.1 mg via ORAL
  Filled 2014-03-27 (×4): qty 1

## 2014-03-27 MED ORDER — NAPROXEN 500 MG PO TABS: 500.0000 mg | ORAL_TABLET | Freq: Two times a day (BID) | ORAL | Status: DC | PRN

## 2014-03-27 MED ORDER — METHOCARBAMOL 500 MG PO TABS: 500.0000 mg | ORAL_TABLET | Freq: Three times a day (TID) | ORAL | Status: DC | PRN

## 2014-03-27 MED ORDER — DICYCLOMINE HCL 20 MG PO TABS: 20.0000 mg | ORAL_TABLET | Freq: Four times a day (QID) | ORAL | Status: DC | PRN

## 2014-03-27 MED ORDER — HYDROXYZINE HCL 25 MG PO TABS: 25.0000 mg | ORAL_TABLET | Freq: Four times a day (QID) | ORAL | Status: DC | PRN

## 2014-03-27 NOTE — Progress Notes (Signed)
Patient ID: Brittany Cain, female   DOB: 1961/07/22, 53 y.o.   MRN: [id number] D)  Has been pleasant this evening, and has been out and about on the hall, interacting appropriately with staff and select peers.  Attended group and participated, supportive of others.  Stated was tired and hasn't been sleeping, was given hs meds, came back later for robaxin as her legs continued to spasm. Denied thoughts of self harm. A)  Wlll continue to monitor q 15 minutes for safety, continue POC R)  Safety maintained.

## 2014-03-27 NOTE — Progress Notes (Signed)
.  Psychoeducational Group Note    Date: 03/27/2014 Time:  0930   Goal Setting Purpose of Group: To be able to set a goal that is measurable and that can be accomplished in one day Participation Level:  Active  Participation Quality:  Appropriate  Affect:  Appropriate  Cognitive:  Appropriate  Insight:  Engaged and Improving  Engagement in Group:  Engaged  Additional Comments:  Pt attended the group and partisipated  Naseem Varden A  

## 2014-03-27 NOTE — Progress Notes (Signed)
Adult Psychoeducational Group Note  Date:  03/27/2014 Time:  1:23 AM  Group Topic/Focus:  Wrap-Up Group:   The focus of this group is to help patients review their daily goal of treatment and discuss progress on daily workbooks.  Participation Level:  Active  Participation Quality:  Appropriate  Affect:  Appropriate  Cognitive:  Alert  Insight: Appropriate  Engagement in Group:  Engaged  Modes of Intervention:  Discussion  Additional Comments:  Pt stated that her day has been up and down. Her goal for tomorrow is to continue learning not to live in the past.  Brittany Cain 03/27/2014, 1:23 AM

## 2014-03-27 NOTE — Progress Notes (Signed)
Patient ID: Leslye PeerDonna Arenz, female   DOB: Apr 16, 1961, 53 y.o.   MRN: [id number] D)   Has been supportive and pleasant to others on the hall this evening, interacting appropriately with staff and peers.  Did c/o headache was pale, diaphoretic,feeling anxious, nausea, was given ginger ale and motrin, and NP was made aware.  Order was given to start clonidine protocol d/t pt having been on hydrocodone prior to admission.  Pt stated had been taking 5/325 usually twice or so a day for her chronic back pain.  Headache and anxiety improving. A)  Will continue to monitor q 15 minutes, continue POC R)  Safety maintained.
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
## 2014-03-27 NOTE — [facility] Group Notes (Signed)
[facility] LCSW Group Therapy  03/27/2014 4:06 PM  Type of Therapy:  Group Therapy  Participation Level:  Active  Participation Quality:  Appropriate, Sharing and Supportive  Affect:  Appropriate  Cognitive:  Alert and Oriented  Insight:  Developing/Improving, Engaged and Supportive  Engagement in Therapy:  Developing/Improving, Engaged and Supportive  Modes of Intervention:  Discussion, Education, Exploration, Rapport Building and Support  Summary of Progress/Problems: Pt was engaged and was able to identify a positive social support and coping skill.  Pt was respectful of other group members and was engaged in discussion.  Brittany SpatesRachel Anne Jiles Cain 03/27/2014, 4:06 PM
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
## 2014-03-27 NOTE — Progress Notes (Signed)
Psychoeducational Group Note  Date: 03/27/2014 Time:  1015  Group Topic/Focus:  Identifying Needs:   The focus of this group is to help patients identify their personal needs that have been historically problematic and identify healthy behaviors to address their needs.  Participation Level:  Active  Participation Quality:  Appropriate  Affect:  Appropriate  Cognitive:  Oriented  Insight:  Improving  Engagement in Group:  Engaged  Additional Comments:  Pt attended the group and participated in the discussion  Vickie Ponds A  

## 2014-03-28 DIAGNOSIS — F4310 Post-traumatic stress disorder, unspecified: Secondary | ICD-10-CM

## 2014-03-28 NOTE — Progress Notes (Signed)
Psychoeducational Group Note  Date:  03/28/2014 Time:  1015  Group Topic/Focus:  Making Healthy Choices:   The focus of this group is to help patients identify negative/unhealthy choices they were using prior to admission and identify positive/healthier coping strategies to replace them upon discharge.  Participation Level:  Active  Participation Quality:  Appropriate  Affect:  Flat  Cognitive:  Oriented  Insight:  Improving  Engagement in Group:  Engaged  Additional Comments:  Added a lot to the conversation and listened to others when they shared  Lillyonna Armstead A 03/28/2014  

## 2014-03-28 NOTE — Progress Notes (Signed)
Psychoeducational Group Note  Date: 03/28/2014 Time: 0930  Group Topic/Focus:  Making Healthy Choices:   The focus of this group is to help patients identify negative/unhealthy choices they were using prior to admission and identify positive/healthier coping strategies to replace them upon discharge.  Participation Level:  Active  Participation Quality:  Appropriate  Affect:  Appropriate  Cognitive:  Appropriate  Insight:  Engaged  Engagement in Group:  Engaged  Additional Comments:    03/28/2014,7:51 PM Joie BimlerPatricia Lynn Amiere Cawley

## 2014-03-28 NOTE — [facility] Group Notes (Signed)
[facility] LCSW Group Therapy No    03/28/2014 1:15PM  Type of Therapy and Topic:  Group Therapy: Stages of Change and Self-Sabotaging, Enabling Behaviors   Participation Level:  Active   Mood: appropriate  Description of Group:     Learn how to identify obstacles, self-sabotaging and enabling behaviors, what are they, why do we do them and what needs do these behaviors meet? Discuss unhealthy relationships and how to have positive healthy boundaries with those that sabotage and enable. Explore aspects of self-sabotage and enabling in yourself and how to limit these self-destructive behaviors in everyday life.  Therapeutic Goals: 1. Patient will identify one obstacle that relates to self-sabotage and enabling behaviors 2. Patient will identify one personal self-sabotaging or enabling behavior they did prior to admission 3. Patient will demonstrate ability to communicate their needs through discussion and/or role plays. 4. Patient will identify where they are in Stages of Change Diagram   Summary of Patient Progress: The main focus of today's process group was to have patient identify with what "self-sabotage" means to them and use Motivational Interviewing to discuss what benefits, negative or positive, were involved in a self-identified self-sabotaging behavior. We then talked about reasons the patient may want to change the behavior and any current desire to change. Patients then had opportunity to identify where they are on the Stages of Change Cycle diagram discussed in group. Brittany Cain shared her hope of "finding a meaningful productive life" yet shared she is in the comtemplation stage and not yet ready for change. This may be why her goal is vague.    Therapeutic Modalities:   Cognitive Behavioral Therapy Person-Centered Therapy Motivational Interviewing   Brittany Bernatherine C Merrie Epler, LCSW

## 2014-03-28 NOTE — Progress Notes (Signed)
D) Pt has attended the groups and interacts with her peers. Mood and affect are appropriate. Pt rates her depression at a 5 and her hopelessness at a 4. Denies SI and HI. Participated in group and shared with others that she had talked with her husband and shared with him her thoughts and feelings. Pt then stated that prior to talking with him she really didn't have hope but now feels that she will be able to work things out with her husband. A) Given support reassurance and praise along with encouragement. Provided Pt with a 1:1 and will provide Pt with some material on communication. R) Denies SI and HI.

## 2014-03-28 NOTE — Progress Notes (Signed)
[facility] Progress Note  03/28/2014 2:12 PM Brittany Cain  MRN:  [id number] Subjective:  Brittany Cain is an 53 y.o. Married white female, [employer] rural [place] and lives there with her husband and older daughter (28) admitted [facility] and [facility] with increased symptoms of depression, anxiety and suicidal ideations. Today she is feeling a little better, but she gets anxious and depressed during group sessions.She has learned that she has very very low self esteem, and my mom is controlling me even at [age]. Patient  has been compliant with her medication and inpatient psychiatric program including milieu therapy and group therapy. Patient has a disturbance of sleep, due to restless leg syndrome but she notes improved sleep with medication. Patient has rates depression 6/10 and anxiety 4/10. Denies SI/HI/AVH at this time. Upon discharge she is interested in a communication course to help her communicate and express herself.  Diagnosis:   DSM5: Schizophrenia Disorders:   Obsessive-Compulsive Disorders:   Trauma-Stressor Disorders:  Posttraumatic Stress Disorder (309.81) Substance/Addictive Disorders:   Depressive Disorders:  Major Depressive Disorder - Severe (296.23) Total Time spent with patient: 30 minutes  Axis I: Generalized Anxiety Disorder, Major Depression, Recurrent severe and Post Traumatic Stress Disorder Axis II: Deferred Axis IV: housing problems, other psychosocial or environmental problems, problems with access to health care services and problems with primary support group Axis V: 51-60 moderate symptoms  ADL's:  Intact  Sleep: Fair  Appetite:  Good  Suicidal Ideation:  Plan:  Denies Intent:  Denies Means:  Denies Homicidal Ideation:  Plan:  Denies Intent:  Denies Means:  Denies AEB (as evidenced by):  Psychiatric Specialty Exam: Physical Exam  Constitutional: She is oriented to person, place, and time. She appears well-developed.  Eyes:  Pupils are equal, round, and reactive to light.  Neck: Normal range of motion.  GI: Soft. Bowel sounds are normal.  Musculoskeletal: Normal range of motion.  Neurological: She is alert and oriented to person, place, and time.  Skin: Skin is warm and dry.  Psychiatric: She has a normal mood and affect. Her behavior is normal.    Review of Systems  Psychiatric/Behavioral: Positive for depression and suicidal ideas. The patient is nervous/anxious.     Blood pressure 101/71, pulse 96, temperature 97.7 F (36.5 C), temperature source Oral, resp. rate 16, height 5' 4.25" (1.632 m), weight 102.967 kg (227 lb).Body mass index is 38.66 kg/(m^2).  General Appearance: Casual, Fairly Groomed and Neat  Eye Contact::  Fair  Speech:  Clear and Coherent and Normal Rate  Volume:  Decreased  Mood:  Anxious, Depressed, Hopeless and Worthless  Affect:  Depressed and Flat  Thought Process:  Circumstantial and Intact  Orientation:  Full (Time, Place, and Person)  Thought Content:  WDL  Suicidal Thoughts:  No  Homicidal Thoughts:  No  Memory:  Immediate;   Fair Recent;   Fair Remote;   Fair  Judgement:  Intact  Insight:  Fair and Lacking  Psychomotor Activity:  Restlessness  Concentration:  Fair  Recall:  Good  Fund of Knowledge:Fair  Language: Good  Akathisia:  No  Handed:  Right  AIMS (if indicated):     Assets:  Communication Skills Desire for Improvement Financial Resources/Insurance Housing Talents/Skills  Sleep:  Number of Hours: 4.5   Musculoskeletal: Strength & Muscle Tone: within normal limits Gait & Station: normal Patient leans: N/A  Current Medications: Current Facility-Administered Medications  Medication Dose Route Frequency Provider Last Rate Last Dose  . acetaminophen (  TYLENOL) tablet 650 mg  650 mg Oral Q6H PRN Beau Fanny, FNP   650 mg at 03/28/14 0011  . alum & mag hydroxide-simeth (MAALOX/MYLANTA) 200-200-20 MG/5ML suspension 30 mL  30 mL Oral Q4H PRN Beau Fanny, FNP      . citalopram (CELEXA) tablet 20 mg  20 mg Oral Daily Beau Fanny, FNP   20 mg at 03/28/14 0900  . cloNIDine (CATAPRES) tablet 0.1 mg  0.1 mg Oral QID Court Joy, PA-C   0.1 mg at 03/27/14 2143   Followed by  . [START ON 03/30/2014] cloNIDine (CATAPRES) tablet 0.1 mg  0.1 mg Oral BH-qamhs Court Joy, PA-C       Followed by  . [START ON 04/02/2014] cloNIDine (CATAPRES) tablet 0.1 mg  0.1 mg Oral QAC breakfast Court Joy, PA-C      . dicyclomine (BENTYL) tablet 20 mg  20 mg Oral Q6H PRN Court Joy, PA-C      . hydrOXYzine (ATARAX/VISTARIL) tablet 25 mg  25 mg Oral Q6H PRN Beau Fanny, FNP   25 mg at 03/28/14 0010  . ibuprofen (ADVIL,MOTRIN) tablet 600 mg  600 mg Oral Q6H PRN Beau Fanny, FNP   600 mg at 03/28/14 0906  . lisinopril (PRINIVIL,ZESTRIL) tablet 20 mg  20 mg Oral q morning - 10a Beau Fanny, FNP   20 mg at 03/27/14 0942  . loperamide (IMODIUM) capsule 2-4 mg  2-4 mg Oral PRN Court Joy, PA-C      . magnesium hydroxide (MILK OF MAGNESIA) suspension 30 mL  30 mL Oral Daily PRN Beau Fanny, FNP      . methocarbamol (ROBAXIN) tablet 750 mg  750 mg Oral Q8H PRN Beau Fanny, FNP   750 mg at 03/28/14 0010  . ondansetron (ZOFRAN-ODT) disintegrating tablet 4 mg  4 mg Oral Q6H PRN Court Joy, PA-C   4 mg at 03/27/14 2129  . pantoprazole (PROTONIX) EC tablet 40 mg  40 mg Oral Daily Beau Fanny, FNP   40 mg at 03/28/14 0901  . PARoxetine (PAXIL) tablet 10 mg  10 mg Oral Daily Nehemiah Settle, MD   10 mg at 03/28/14 0901  . rOPINIRole (REQUIP) tablet 0.25 mg  0.25 mg Oral QHS Beau Fanny, FNP   0.25 mg at 03/27/14 2106  . traZODone (DESYREL) tablet 100 mg  100 mg Oral QHS PRN Beau Fanny, FNP   100 mg at 03/27/14 2106    Lab Results: No results found for this or any previous visit (from the past 48 hour(s)).  Physical Findings: AIMS: Facial and Oral Movements Muscles of Facial Expression: None, normal Lips and  Perioral Area: None, normal Jaw: None, normal Tongue: None, normal,Extremity Movements Upper (arms, wrists, hands, fingers): None, normal Lower (legs, knees, ankles, toes): None, normal, Trunk Movements Neck, shoulders, hips: None, normal, Overall Severity Severity of abnormal movements (highest score from questions above): None, normal Incapacitation due to abnormal movements: None, normal Patient's awareness of abnormal movements (rate only patient's report): No Awareness, Dental Status Current problems with teeth and/or dentures?: No Does patient usually wear dentures?: No  CIWA:  CIWA-Ar Total: 3 COWS:     Treatment Plan Summary: Daily contact with patient to assess and evaluate symptoms and progress in treatment Medication management  Plan:Plan: Review of chart, vital signs, medications, and notes.  1-Admit for crisis management and stabilization. Estimated length of stay 5-7 days past his current  stay of 1  2-Individual and group therapy encouraged  3-Medication management for depression and anxiety to reduce current symptoms to base line and improve the patient's overall level of functioning: Medications reviewed with the patient.  4-Coping skills for depression, substance abuse, anger issues, and anxiety developing--  5-Continue crisis stabilization and management  6-Address health issues--monitoring vital signs, stable  7-Treatment plan in progress to prevent relapse of depression, angry outbursts, and anxiety  8-Psychosocial education regarding relapse prevention and self-care  9-Health care follow up as needed for any health concerns  10-Call for consult with hospitalist for additional specialty patient services as needed.   Medical Decision Making Problem Points:  Established problem, worsening (2), Review of last therapy session (1) and Review of psycho-social stressors (1) Data Points:  Review or order clinical lab tests (1) Review or order medicine tests (1) Review  of medication regiment & side effects (2) Review of new medications or change in dosage (2)  I certify that inpatient services furnished can reasonably be expected to improve the patient's condition.   Juel Burrow Starkes FNP-BC 03/28/2014, 2:12 PM

## 2014-03-29 MED ORDER — CELECOXIB 100 MG PO CAPS
200.0000 mg | ORAL_CAPSULE | Freq: Two times a day (BID) | ORAL | Status: DC
  Administered 2014-03-29 – 2014-03-31 (×4): 200 mg via ORAL
  Filled 2014-03-29: qty 2
  Filled 2014-03-29 (×2): qty 12
  Filled 2014-03-29 (×6): qty 2

## 2014-03-29 NOTE — Progress Notes (Signed)
Patient ID: Brittany PeerDonna Cain, female   DOB: [birth year], 53 y.o.   MRN: [id number] D)  Has been out on the hall this evening, attended group, stated feeling a little better today, headaches aren't as bad, anxiety also a little better.   States still having some pain and stiffness that isn't covered by the motrin, especially in the mornings.  Attended group this evening,  Working on communication issues with her husband.  Has been pleasant, and interacting appropriately with staff and peers, especially supportive of her roommate.  Denies thoughts of self harm. A)  Will continue to monitor q 15 mnutes for  Safety, continue POC R)  Safety maintained.

## 2014-03-29 NOTE — [facility] Group Notes (Signed)
[facility]   03/29/2014 4:18 PM    Participation Quality:  Appropraite  Mood/Affect:  Appropriate  Depression Rating:  5  Anxiety Rating:  4  Thoughts of Suicide:  No  Will you contract for safety?   NA  Current AVH:  No  Plan for Discharge/Comments:  Patient attended discharge planning group and actively participated in group.  She is interested in follow up with [facility] in [place]. CSW provided all participants with daily workbook.   Transportation Means: Patient has transportation.   Supports:  Patient has a support system.   Monicia Tse, Joesph JulyQuylle Hairston

## 2014-03-29 NOTE — Progress Notes (Signed)
D) Pt attending the groups and interacting with her peers. Affect is flat and mood depressed. Continues to work on her issues at home concerning her relationship with her family. "Still not sleep as well as I could be". A) Given support, reassurance and praise. Encouragement given. R) Denies SI and HI. Depression remains.

## 2014-03-29 NOTE — [facility] Group Notes (Signed)
[facility] LCSW Group Therapy          Overcoming Obstacles       1:15 -2:30        03/29/2014       Type of Therapy:  Group Therapy  Participation Level:  Appropriate  Participation Quality:  Appropriate  Affect:  Appropriate, Alert  Cognitive:  Attentive Appropriate  Insight: Developing/Improving Engaged  Engagement in Therapy: Developing/Imprvoing Engaged  Modes of Intervention:  Discussion Exploration  Education Rapport BuildingProblem-Solving Support  Summary of Progress/Problems:  The main focus of today's group was overcoming obstacles. Patient listened attentively but did not participate in discussion.   [name], Everard Interrante Hairston 03/29/2014

## 2014-03-29 NOTE — Progress Notes (Signed)
Patient ID: Brittany Cain, female   DOB: 1961-08-21, 53 y.o.   MRN: [id number] [facility] MD Progress Note  03/29/2014 2:15 PM Brittany Cain  MRN:  [id number] Subjective:  Met with patient to discuss her progress in treatment. She notes that she is better in that now there is "hope" and that she has "learned new coping skills in the groups here." She feels that she can use her new skills to help her manage the stress at work and at home when she is ready to discharge from [facility]. She notes poor sleep continues to be an issue and her back pain also continues to be an issue.  She denies SI/HI or AVH, but rates her depression as a 6/10 and her anxiety as a 6/10. She does endorse feeling hopeless and helpless. Her back pain is at 3/10. Diagnosis:   DSM5: Schizophrenia Disorders:   Obsessive-Compulsive Disorders:   Trauma-Stressor Disorders:  Posttraumatic Stress Disorder (309.81) Substance/Addictive Disorders:   Depressive Disorders:  Major Depressive Disorder - Severe (296.23) Total Time spent with patient: 30 minutes  Axis I: Generalized Anxiety Disorder, Major Depression, Recurrent severe and Post Traumatic Stress Disorder Axis II: Deferred Axis IV: housing problems, other psychosocial or environmental problems, problems with access to health care services and problems with primary support group Axis V: 51-60 moderate symptoms  ADL's:  Intact  Sleep: Fair  Appetite:  Good  Suicidal Ideation:  Plan:  Denies Intent:  Denies Means:  Denies Homicidal Ideation:  Plan:  Denies Intent:  Denies Means:  Denies AEB (as evidenced by):  Psychiatric Specialty Exam: Physical Exam  Constitutional: She is oriented to person, place, and time. She appears well-developed.  Eyes: Pupils are equal, round, and reactive to light.  Neck: Normal range of motion.  GI: Soft. Bowel sounds are normal.  Musculoskeletal: Normal range of motion.  Neurological: She is alert and oriented to person, place, and time.   Skin: Skin is warm and dry.  Psychiatric: She has a normal mood and affect. Her behavior is normal.    Review of Systems  Psychiatric/Behavioral: Positive for depression and suicidal ideas. The patient is nervous/anxious.     Blood pressure 111/65, pulse 91, temperature 97.6 F (36.4 C), temperature source Oral, resp. rate 18, height 5' 4.25" (1.632 m), weight 102.967 kg (227 lb).Body mass index is 38.66 kg/(m^2).  General Appearance: Casual, Fairly Groomed and Neat  Eye Contact::  Fair  Speech:  Clear and Coherent and Normal Rate  Volume:  Decreased  Mood:  Anxious, Depressed, Hopeless and Worthless  Affect:  Depressed and Flat  Thought Process:  Circumstantial and Intact  Orientation:  Full (Time, Place, and Person)  Thought Content:  WDL  Suicidal Thoughts:  No  Homicidal Thoughts:  No  Memory:  Immediate;   Fair Recent;   Fair Remote;   Fair  Judgement:  Intact  Insight:  Fair and Lacking  Psychomotor Activity:  Restlessness  Concentration:  Fair  Recall:  Good  Fund of Knowledge:Fair  Language: Good  Akathisia:  No  Handed:  Right  AIMS (if indicated):     Assets:  Communication Skills Desire for Improvement Financial Resources/Insurance Housing Talents/Skills  Sleep:  Number of Hours: 4.5   Musculoskeletal: Strength & Muscle Tone: within normal limits Gait & Station: normal Patient leans: N/A  Current Medications: Current Facility-Administered Medications  Medication Dose Route Frequency Provider Last Rate Last Dose  . acetaminophen (TYLENOL) tablet 650 mg  650 mg Oral Q6H PRN Benjamine Mola, FNP  650 mg at 03/28/14 0011  . alum & mag hydroxide-simeth (MAALOX/MYLANTA) 200-200-20 MG/5ML suspension 30 mL  30 mL Oral Q4H PRN Benjamine Mola, FNP      . celecoxib (CELEBREX) capsule 200 mg  200 mg Oral BID Nena Polio, PA-C      . citalopram (CELEXA) tablet 20 mg  20 mg Oral Daily Benjamine Mola, FNP   20 mg at 03/29/14 0804  . cloNIDine (CATAPRES) tablet 0.1  mg  0.1 mg Oral QID Dara Hoyer, PA-C   0.1 mg at 03/29/14 1322   Followed by  . [START ON 03/30/2014] cloNIDine (CATAPRES) tablet 0.1 mg  0.1 mg Oral BH-qamhs Dara Hoyer, PA-C       Followed by  . [START ON 04/02/2014] cloNIDine (CATAPRES) tablet 0.1 mg  0.1 mg Oral QAC breakfast Dara Hoyer, PA-C      . dicyclomine (BENTYL) tablet 20 mg  20 mg Oral Q6H PRN Dara Hoyer, PA-C      . hydrOXYzine (ATARAX/VISTARIL) tablet 25 mg  25 mg Oral Q6H PRN Benjamine Mola, FNP   25 mg at 03/28/14 0010  . ibuprofen (ADVIL,MOTRIN) tablet 600 mg  600 mg Oral Q6H PRN Benjamine Mola, FNP   600 mg at 03/28/14 2130  . lisinopril (PRINIVIL,ZESTRIL) tablet 20 mg  20 mg Oral q morning - 10a Benjamine Mola, FNP   20 mg at 03/29/14 0805  . loperamide (IMODIUM) capsule 2-4 mg  2-4 mg Oral PRN Dara Hoyer, PA-C      . magnesium hydroxide (MILK OF MAGNESIA) suspension 30 mL  30 mL Oral Daily PRN Benjamine Mola, FNP      . methocarbamol (ROBAXIN) tablet 750 mg  750 mg Oral Q8H PRN Benjamine Mola, FNP   750 mg at 03/28/14 2128  . ondansetron (ZOFRAN-ODT) disintegrating tablet 4 mg  4 mg Oral Q6H PRN Dara Hoyer, PA-C   4 mg at 03/27/14 2129  . pantoprazole (PROTONIX) EC tablet 40 mg  40 mg Oral Daily Benjamine Mola, FNP   40 mg at 03/29/14 0804  . PARoxetine (PAXIL) tablet 10 mg  10 mg Oral Daily Durward Parcel, MD   10 mg at 03/29/14 0804  . rOPINIRole (REQUIP) tablet 0.25 mg  0.25 mg Oral QHS Benjamine Mola, FNP   0.25 mg at 03/28/14 2127  . traZODone (DESYREL) tablet 100 mg  100 mg Oral QHS PRN Benjamine Mola, FNP   100 mg at 03/28/14 2131    Lab Results: No results found for this or any previous visit (from the past 48 hour(s)).  Physical Findings: AIMS: Facial and Oral Movements Muscles of Facial Expression: None, normal Lips and Perioral Area: None, normal Jaw: None, normal Tongue: None, normal,Extremity Movements Upper (arms, wrists, hands, fingers): None, normal Lower (legs,  knees, ankles, toes): None, normal, Trunk Movements Neck, shoulders, hips: None, normal, Overall Severity Severity of abnormal movements (highest score from questions above): None, normal Incapacitation due to abnormal movements: None, normal Patient's awareness of abnormal movements (rate only patient's report): No Awareness, Dental Status Current problems with teeth and/or dentures?: No Does patient usually wear dentures?: No  CIWA:  CIWA-Ar Total: 0 COWS:     Treatment Plan Summary: Daily contact with patient to assess and evaluate symptoms and progress in treatment Medication management  Plan:Plan: Review of chart, vital signs, medications, and notes.  1. Patient is making progress toward goals and d/c. 2. Will add  Celebrex 270m po BID for joint pain. 3. Will continue current plan of care as written with the above change. 4. Dispo is in progress with plans to d/c home in 2 days.  Medical Decision Making Problem Points:  Established problem, worsening (2), Review of last therapy session (1) and Review of psycho-social stressors (1) Data Points:  Review or order clinical lab tests (1) Review or order medicine tests (1) Review of medication regiment & side effects (2) Review of new medications or change in dosage (2)  I certify that inpatient services furnished can reasonably be expected to improve the patient's condition.   NMarlane Hatcher Mashburn RPAC 2:19 PM 03/29/2014  Reviewed the information documented and agree with the treatment plan.  JParke SimmersJonnalagadda 03/30/2014 12:43 PM

## 2014-03-29 NOTE — Progress Notes (Signed)
Adult Psychoeducational Group Note  Date:  03/29/2014 Time:  10:26 PM  Group Topic/Focus:  Goals Group:   The focus of this group is to help patients establish daily goals to achieve during treatment and discuss how the patient can incorporate goal setting into their daily lives to aide in recovery.  Participation Level:  Active  Participation Quality:  Appropriate  Affect:  Appropriate  Cognitive:  Appropriate  Insight: Appropriate  Engagement in Group:  Engaged  Modes of Intervention:  Discussion  Additional Comments:  Pt stated that the day was sad at first because the other patient left but was excited because she gets to leave later on this week.  Aldona LentoMalcolm R Latayvia Mandujano 03/29/2014, 10:26 PM

## 2014-03-29 NOTE — Progress Notes (Signed)
D: pt stated she feels blah, feels numb inside, referred to self as being a wet noodle, no feelings at all. Pt stated she has insomnia and hasn't been able to sleep for awhile due to stress from work. denies si/hi/avh. denies pain. A: support and encouragement given. q 15 min safety checks. scheduled medications given.  R: pt remains safe on unit. No further signs of distress or complaints at this time

## 2014-03-30 MED ORDER — CITALOPRAM HYDROBROMIDE 10 MG PO TABS
30.0000 mg | ORAL_TABLET | Freq: Every day | ORAL | Status: DC
  Administered 2014-03-31: 30 mg via ORAL
  Filled 2014-03-30 (×2): qty 3
  Filled 2014-03-30: qty 9

## 2014-03-30 NOTE — Progress Notes (Signed)
Recreation Therapy Notes  Animal-Assisted Activity/Therapy (AAA/T) Program Checklist/Progress Notes Patient Eligibility Criteria Checklist & Daily Group note for Rec Tx Intervention  Date: 04.14.2015 Time: 2:45am Location: [address] Dayroom    AAA/T Program Assumption of Risk Form signed by Patient/ or Parent Legal Guardian yes  Patient is free of allergies or sever asthma yes  Patient reports no fear of animals yes  Patient reports no history of cruelty to animals yes   Patient understands his/her participation is voluntary yes  Patient washes hands before animal contact yes  Patient washes hands after animal contact yes  Behavioral Response: Engaged, Appropriate   Education: Hand Washing, Appropriate Animal Interaction   Education Outcome: Acknowledges understanding  Clinical Observations/Feedback: Patient interacted appropriately with therapy dog team.    Kordelia Severin L Nakaila Freeze, LRT/CTRS  Micholas Drumwright L Davette Nugent 03/30/2014 4:52 PM 

## 2014-03-30 NOTE — [facility] Suicide Risk Assessment (Signed)
[facility] INPATIENT:  Family/Significant Other Suicide Prevention Education  Suicide Prevention Education:  Education Completed; Brittany Cain, Husband [phone];  has been identified by the patient as the family member/significant other with whom the patient will be residing, and identified as the person(s) who will aid the patient in the event of a mental health crisis (suicidal ideations/suicide attempt).  With written consent from the patient, the family member/significant other has been provided the following suicide prevention education, prior to the and/or following the discharge of the patient.  The suicide prevention education provided includes the following:  Suicide risk factors  Suicide prevention and interventions  National Suicide Hotline telephone number  [facility] assessment telephone number  [facility] Emergency Assistance 911  [facility] and/or Residential Mobile Crisis Unit telephone number  Request made of family/significant other to:  Remove weapons (e.g., guns, rifles, knives), all items previously/currently identified as safety concern.  Husband advised patient does not have access to weapons.    Remove drugs/medications (over-the-counter, prescriptions, illicit drugs), all items previously/currently identified as a safety concern.  The family member/significant other verbalizes understanding of the suicide prevention education information provided.  The family member/significant other agrees to remove the items of safety concern listed above.  Margree Gimbel Hairston Paislie Tessler 03/30/2014, 9:18 AM

## 2014-03-30 NOTE — [facility] Group Notes (Signed)
[facility] LCSW Group Therapy      Feelings About Diagnosis 1:15 - 2:30 PM         03/30/2014  3:56 PM    Type of Therapy:  Group Therapy  Participation Level:  Active  Participation Quality:  Appropriate  Affect:  Appropriate  Cognitive:  Alert and Appropriate  Insight:  Developing/Improving and Engaged  Engagement in Therapy:  Developing/Improving and Engaged  Modes of Intervention:  Discussion, Education, Exploration, Problem-Solving, Rapport Building, Support  Summary of Progress/Problems:  Patient actively participated in group. Patient discussed past and present diagnosis and the effects it has had on  life.  Patient talked about family and society being judgmental and the stigma associated with having a mental health diagnosis.She shared she is embarrassed about her diagnosis and does not know how to get her mother and husband to accept her problems.  Patient encouraged to accept she can not change other people's thoughts but she has to be able to accept herself.  [name], Shaunee Mulkern Hairston 03/30/2014  3:56 PM
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
## 2014-03-31 MED ORDER — TRAZODONE HCL 100 MG PO TABS: 100.0000 mg | ORAL_TABLET | Freq: Every evening | ORAL | Status: DC | PRN

## 2014-03-31 MED ORDER — CELECOXIB 200 MG PO CAPS: 200.0000 mg | ORAL_CAPSULE | Freq: Two times a day (BID) | ORAL | Status: DC

## 2014-03-31 MED ORDER — LISINOPRIL 20 MG PO TABS: 20.0000 mg | ORAL_TABLET | Freq: Every morning | ORAL | Status: DC

## 2014-03-31 MED ORDER — ROPINIROLE HCL 0.25 MG PO TABS
0.2500 mg | ORAL_TABLET | Freq: Every day | ORAL | Status: DC
Start: 2014-03-31 — End: 2020-10-13

## 2014-03-31 MED ORDER — CITALOPRAM HYDROBROMIDE 10 MG PO TABS: 30.0000 mg | ORAL_TABLET | Freq: Every day | ORAL | Status: DC

## 2014-03-31 MED ORDER — RABEPRAZOLE SODIUM 20 MG PO TBEC
20.0000 mg | DELAYED_RELEASE_TABLET | Freq: Every morning | ORAL | Status: DC
Start: 2014-03-31 — End: 2019-05-26

## 2014-03-31 NOTE — Progress Notes (Signed)
D: Patient denies SI/HI or AVH.  Pt. Is flat and blunted.  She is calm and cooperative but forwards little.  Pt. States she will be going home today.     A: Patient given emotional support from RN. Patient encouraged to come to staff with concerns and/or questions. Patient's medication routine continued. Patient's orders and plan of care reviewed.   R: Patient remains appropriate and cooperative. Will continue to monitor patient q15 minutes for safety.

## 2014-03-31 NOTE — Progress Notes (Signed)
Adult Psychoeducational Group Note  Date:  03/30/2014 Time:  11:40 PM  Group Topic/Focus:  Wrap-Up Group:   The focus of this group is to help patients review their daily goal of treatment and discuss progress on daily workbooks.  Participation Level:  Active  Participation Quality:  Appropriate  Affect:  Appropriate  Cognitive:  Appropriate  Insight: Appropriate  Engagement in Group:  Engaged  Modes of Intervention:  Discussion  Additional Comments:  Pt was present for wrap up group. She sated that she learned that you can't change other people. She said that she got to see her husband and sister, both of whom are learning to be supportive in the ways that she needs. She was open and cooperative in group.   [name] 03/30/2014, 11:40 PM
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
## 2014-03-31 NOTE — Progress Notes (Signed)
[facility] :  Will you be returning to the same living situation after discharge: Yes,  Patient is returning to her home. At discharge, do you have transportation home?:Yes,  Patient to arrange transportation home. Do you have the ability to pay for your medications:Yes,  Patient is able to obtain medications.  Release of information consent forms completed and in the chart;  Patient's signature needed at discharge.  Patient to Follow up at: Follow-up Information   [facility] On 04/01/2014. (Please go to [facility]'s walk in clinic on Thursday, April 01, 2014 or any weekday between [place]8AM -5PM)    Contact information:   [address] [place], [place]  [id number]  [phone]      Patient denies SI/HI:   Patient no longer endorsing SI/HI or other thoughts of self harm.     Safety Planning and Suicide Prevention discussed:.Reviewed with all patients during discharge planning group'  Brittany Cain Brittany Cain Brittany Cain 03/31/2014, 4:11 PM

## 2014-03-31 NOTE — Progress Notes (Signed)
Patient ID: Brittany PeerDonna Korte, female   DOB: [birth year], 53 y.o.   MRN: [id number]  D: Writer asked the circumstances surrounding pt's adm. Pt stated, "my sister wanted me to come. I didn't know if people would think I was weak or ashamed." However, pt wasn't sure if she should believe her sister because according to pt her sister also has mental illness. Pt stated she wasn't sure if she actually needed the help or not, but is now glad that she came.   A:  Support and encouragement was offered. 15 min checks continued for safety.  R: Pt remains safe.

## 2014-03-31 NOTE — ED Provider Notes (Signed)
Medical screening examination/treatment/procedure(s) were performed by non-physician practitioner and as supervising physician I was immediately available for consultation/collaboration.    Sunnie NielsenBrian Kajuana Shareef, MD 03/31/14 2217

## 2014-03-31 NOTE — Progress Notes (Signed)
Adult Psychoeducational Group Note  Date:  03/31/2014 Time:  10:00am  Group Topic/Focus:  Personal Choices and Values:   The focus of this group is to help patients assess and explore the importance of values in their lives, how their values affect their decisions, how they express their values and what opposes their expression.  Participation Level:  Active  Participation Quality:  Appropriate and Attentive  Affect:  Appropriate  Cognitive:  Alert and Appropriate  Insight: Appropriate  Engagement in Group:  Engaged  Modes of Intervention:  Discussion and Education  Additional Comments: Pt attended and participated in group. Discussion was on personal development. Pt was asked what did personal development to you? Pt stated it means  Taking better care of self,shower everyday, eating right and no negative behavior.   Pryor Curiaanya D Garner 03/31/2014, 2:00 PM

## 2014-03-31 NOTE — Discharge Summary (Signed)
Physician Discharge Summary Note  Patient:  Brittany Cain is an 53 y.o., female MRN:  [id number] DOB:  04-21-1961 Patient phone:  [phone] (home)  Patient address:   [address] [place] [address],  Total Time spent with patient: 30 minutes  Date of Admission:  03/25/2014 Date of Discharge: 03/31/2014  Reason for Admission:  MDD with suicidal ideation  Discharge Diagnoses: Active Problems:   GAD (generalized anxiety disorder)   Psychiatric Specialty Exam:  See D/C SRA Physical Exam  ROS  Blood pressure 123/81, pulse 82, temperature 98 F (36.7 C), temperature source Oral, resp. rate 20, height 5' 4.25" (1.632 m), weight 102.967 kg (227 lb).Body mass index is 38.66 kg/(m^2).  General Appearance:   Eye Contact::    Speech:    Volume:    Mood:    Affect:    Thought Process:    Orientation:    Thought Content:    Suicidal Thoughts:    Homicidal Thoughts:    Memory:    Judgement:    Insight:    Psychomotor Activity:    Concentration:    Recall:    Fund of Knowledge:  Language:   Akathisia:    Handed:    AIMS (if indicated):     Assets:    Sleep:  Number of Hours: 5.75    Past Psychiatric History: Diagnosis:  Hospitalizations:  Outpatient Care:  Substance Abuse Care:  Self-Mutilation:  Suicidal Attempts:  Violent Behaviors:   Musculoskeletal: Strength & Muscle Tone:  Gait & Station:  Patient leans:   DSM5:  Schizophrenia Disorders:   Obsessive-Compulsive Disorders:   Trauma-Stressor Disorders:   Substance/Addictive Disorders:   Depressive Disorders:    Axis Diagnosis:  Discharge Diagnoses:  AXIS I: Generalized Anxiety Disorder and Major Depression, Recurrent severe  AXIS II: Deferred  AXIS III:  Past Medical History   Diagnosis  Date   .  Hypertension     AXIS IV: other psychosocial or environmental problems, problems related to social environment and problems with primary support group  AXIS V: 61-70 mild symptoms  Level of  Care:  OP  Hospital Course:        Brittany PeerDonna Cain is an 53 y.o. 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          Upon admission to the adult unit [name] was evaluated by a psychiatrist and her symptoms were documented. She endorsed depression, anhedonia, insomnia, psychomotor agitation and retardation, fatigue,intrusive thoughts, and difficulty concentrating.         Medication management was initiated with Celexa, and her home medications she was also given Clonidine to help with the withdrawal from Norco and flexeril.           [name] was encouraged to participate in unit programming and did so. She got along well with her peers and the staff. Her behavior was appropriate and she did not require a 1:1, seclusion or restraint.         By the day of discharge [name] was in much improved condition and ready to return home.  She denied any SI/HI or AVH. She was motivated to follow up as noted below and agreed to take her medications as written. Consults:  None  Significant Diagnostic Studies:  None  Discharge Vitals:   Blood pressure 123/81, pulse 82, temperature 98 F (36.7  C), temperature source Oral, resp. rate 20, height 5' 4.25" (1.632 m), weight 102.967 kg (227 lb). Body mass index is 38.66 kg/(m^2). Lab Results:   No results found for this or any previous visit (from the past 72 hour(s)).  Physical Findings: AIMS: Facial and Oral Movements Muscles of Facial Expression: None, normal Lips and Perioral Area: None, normal Jaw: None, normal Tongue: None, normal,Extremity Movements Upper (arms, wrists, hands, fingers): None, normal Lower (legs, knees, ankles, toes): None, normal, Trunk Movements Neck, shoulders, hips: None, normal, Overall Severity Severity of abnormal movements (highest score from  questions above): None, normal Incapacitation due to abnormal movements: None, normal Patient's awareness of abnormal movements (rate only patient's report): No Awareness, Dental Status Current problems with teeth and/or dentures?: No Does patient usually wear dentures?: No  CIWA:  CIWA-Ar Total: 0 COWS:     Psychiatric Specialty Exam: See Psychiatric Specialty Exam and Suicide Risk Assessment completed by Attending Physician prior to discharge.  Discharge destination:  Home  Is patient on multiple antipsychotic therapies at discharge:  No   Has Patient had three or more failed trials of antipsychotic monotherapy by history:  No  Recommended Plan for Multiple Antipsychotic Therapies: NA  Discharge Orders   Future Orders Complete By Expires   Diet - low sodium heart healthy  As directed    Discharge instructions  As directed    Increase activity slowly  As directed        Medication List    STOP taking these medications       cyclobenzaprine 10 MG tablet  Commonly known as:  FLEXERIL     HYDROcodone-acetaminophen 5-325 MG per tablet  Commonly known as:  NORCO/VICODIN     PARoxetine 40 MG tablet  Commonly known as:  PAXIL      TAKE these medications     Indication   celecoxib 200 MG capsule  Commonly known as:  CELEBREX  Take 1 capsule (200 mg total) by mouth 2 (two) times daily.   Indication:  Joint Damage causing Pain and Loss of Function     citalopram 10 MG tablet  Commonly known as:  CELEXA  Take 3 tablets (30 mg total) by mouth daily.   Indication:  Depression, Social Anxiety Disorder     lisinopril 20 MG tablet  Commonly known as:  PRINIVIL,ZESTRIL  Take 1 tablet (20 mg total) by mouth every morning.   Indication:  High Blood Pressure     RABEprazole 20 MG tablet  Commonly known as:  ACIPHEX  Take 1 tablet (20 mg total) by mouth every morning.   Indication:  Gastroesophageal Reflux Disease     rOPINIRole 0.25 MG tablet  Commonly known as:  REQUIP   Take 1 tablet (0.25 mg total) by mouth at bedtime.   Indication:  Restless Leg Syndrome     traZODone 100 MG tablet  Commonly known as:  DESYREL  Take 1 tablet (100 mg total) by mouth at bedtime as needed for sleep.   Indication:  Trouble Sleeping         Follow-up recommendations:   Activities: Resume activity as tolerated. Diet: Heart healthy low sodium diet Tests: Follow up testing will be determined by your out patient provider. Comments:   Total Discharge Time:  Greater than 30 minutes.  Signed: Verne Spurreil Mashburn 03/31/2014, 12:30 PM  Patient was seen face-to-face for psychiatric evaluation, suicide risk assessment, case discussed with the treatment team and a physician extender. Made appropriate disposition plan. Reviewed the information  documented and agree with the treatment plan.  Randal Buba Empress Newmann 04/16/2014 8:07 PM

## 2014-03-31 NOTE — Progress Notes (Signed)
Pt. Discharged per MD orders;  PT. Currently denies any HI/SI or AVH.  Pt. Was given education regarding follow up appointments and medications by RN.  Pt. Denies any questions or concerns about the medications.  Pt. Was escorted to the search room to retrieve her belongings by RN before being discharged to the hospital lobby.  
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
## 2014-03-31 NOTE — [facility] Group Notes (Signed)
[facility] LCS[facility] Discharge Planning Group Note   03/31/2014 9:53 AM    Participation Quality:  Appropraite  Mood/Affect:  Appropriate  Depression Rating:  6  Anxiety Rating:  9  Thoughts of Suicide:  No  Will you contract for safety?   NA  Current AVH:  No  Plan for Discharge/Comments:  Patient attended discharge planning group and actively participated in group.  Follow up with [facility] in [place].  CSW provided all participants with daily workbook.   Transportation Means: Patient has transportation.   Supports:  Patient has a support system.   Laneya Gasaway, Joesph JulyQuylle Hairston

## 2014-04-05 NOTE — Progress Notes (Signed)
Patient Discharge Instructions:  After Visit Summary (AVS):   Faxed to:  04/05/14 Discharge Summary Note:   Faxed to:  04/05/14 Psychiatric Admission Assessment Note:   Faxed to:  04/05/14 Suicide Risk Assessment - Discharge Assessment:   Faxed to:  04/05/14 Faxed/Sent to the Next Level Care provider:  04/05/14 Faxed to [facility] @ [phone]  Jerelene ReddenSheena E [place], 04/05/2014, 3:36 PM

## 2014-04-08 ENCOUNTER — Ambulatory Visit (HOSPITAL_COMMUNITY)
Admission: AD | Admit: 2014-04-08 | Discharge: 2014-04-08 | Disposition: A | Discharge status: Home / Self Care | Payer: Federal, State, Local not specified - PPO | Source: Home / Self Care | Attending: Psychiatry | Admitting: Psychiatry

## 2014-04-08 ENCOUNTER — Emergency Department (HOSPITAL_COMMUNITY)
Admission: EM | Admit: 2014-04-08 | Discharge: 2014-04-08 | Disposition: A | Discharge status: Other Acute Inpatient Hospital | Payer: Federal, State, Local not specified - PPO | Attending: Emergency Medicine | Admitting: Emergency Medicine

## 2014-04-08 ENCOUNTER — Encounter (HOSPITAL_COMMUNITY): Payer: Self-pay | Admitting: *Deleted

## 2014-04-08 ENCOUNTER — Encounter (HOSPITAL_COMMUNITY): Payer: Self-pay | Admitting: Emergency Medicine

## 2014-04-08 ENCOUNTER — Inpatient Hospital Stay (HOSPITAL_COMMUNITY)
Admission: EM | Admit: 2014-04-08 | Discharge: 2014-04-16 | DRG: 885 | Disposition: A | Discharge status: Home / Self Care | Payer: Federal, State, Local not specified - PPO | Source: Intra-hospital | Attending: Psychiatry | Admitting: Psychiatry

## 2014-04-08 DIAGNOSIS — K219 Gastro-esophageal reflux disease without esophagitis: Secondary | ICD-10-CM | POA: Diagnosis present

## 2014-04-08 DIAGNOSIS — Z79899 Other long term (current) drug therapy: Secondary | ICD-10-CM | POA: Insufficient documentation

## 2014-04-08 DIAGNOSIS — F32A Depression, unspecified: Secondary | ICD-10-CM

## 2014-04-08 DIAGNOSIS — K029 Dental caries, unspecified: Secondary | ICD-10-CM | POA: Diagnosis present

## 2014-04-08 DIAGNOSIS — Z6838 Body mass index (BMI) 38.0-38.9, adult: Secondary | ICD-10-CM

## 2014-04-08 DIAGNOSIS — F410 Panic disorder [episodic paroxysmal anxiety] without agoraphobia: Secondary | ICD-10-CM | POA: Diagnosis present

## 2014-04-08 DIAGNOSIS — F332 Major depressive disorder, recurrent severe without psychotic features: Principal | ICD-10-CM | POA: Diagnosis present

## 2014-04-08 DIAGNOSIS — R45851 Suicidal ideations: Secondary | ICD-10-CM

## 2014-04-08 DIAGNOSIS — F4310 Post-traumatic stress disorder, unspecified: Secondary | ICD-10-CM | POA: Diagnosis present

## 2014-04-08 DIAGNOSIS — E669 Obesity, unspecified: Secondary | ICD-10-CM | POA: Diagnosis present

## 2014-04-08 DIAGNOSIS — G589 Mononeuropathy, unspecified: Secondary | ICD-10-CM | POA: Diagnosis present

## 2014-04-08 DIAGNOSIS — F329 Major depressive disorder, single episode, unspecified: Secondary | ICD-10-CM | POA: Insufficient documentation

## 2014-04-08 DIAGNOSIS — F419 Anxiety disorder, unspecified: Secondary | ICD-10-CM

## 2014-04-08 DIAGNOSIS — F411 Generalized anxiety disorder: Secondary | ICD-10-CM | POA: Insufficient documentation

## 2014-04-08 DIAGNOSIS — G4700 Insomnia, unspecified: Secondary | ICD-10-CM | POA: Diagnosis present

## 2014-04-08 DIAGNOSIS — I10 Essential (primary) hypertension: Secondary | ICD-10-CM | POA: Diagnosis present

## 2014-04-08 DIAGNOSIS — F3289 Other specified depressive episodes: Secondary | ICD-10-CM | POA: Insufficient documentation

## 2014-04-08 LAB — COMPREHENSIVE METABOLIC PANEL
ALBUMIN: 3.6 g/dL (ref 3.5–5.2)
ALT: 23 U/L (ref 0–35)
AST: 16 U/L (ref 0–37)
Alkaline Phosphatase: 81 U/L (ref 39–117)
BUN: 13 mg/dL (ref 6–23)
CALCIUM: 9.4 mg/dL (ref 8.4–10.5)
CO2: 29 meq/L (ref 19–32)
CREATININE: 0.83 mg/dL (ref 0.50–1.10)
Chloride: 103 mEq/L (ref 96–112)
GFR calc Af Amer: >90 mL/min (ref >90)
GFR, EST NON AFRICAN AMERICAN: 80 mL/min — AB (ref >90)
Glucose, Bld: 101 mg/dL — ABNORMAL HIGH (ref 70–99)
Potassium: 3.9 mEq/L (ref 3.7–5.3)
SODIUM: 142 meq/L (ref 137–147)
Total Bilirubin: 0.3 mg/dL (ref 0.3–1.2)
Total Protein: 6.7 g/dL (ref 6.0–8.3)

## 2014-04-08 LAB — RAPID URINE DRUG SCREEN, HOSP PERFORMED
Amphetamines: NOT DETECTED
BARBITURATES: NOT DETECTED
BENZODIAZEPINES: NOT DETECTED
Cocaine: NOT DETECTED
Opiates: NOT DETECTED
Tetrahydrocannabinol: NOT DETECTED

## 2014-04-08 LAB — CBC
HCT: 34.8 % — ABNORMAL LOW (ref 36.0–46.0)
Hemoglobin: 11.8 g/dL — ABNORMAL LOW (ref 12.0–15.0)
MCH: 29.3 pg (ref 26.0–34.0)
MCHC: 33.9 g/dL (ref 30.0–36.0)
MCV: 86.4 fL (ref 78.0–100.0)
PLATELETS: 310 10*3/uL (ref 150–400)
RBC: 4.03 MIL/uL (ref 3.87–5.11)
RDW: 12.9 % (ref 11.5–15.5)
WBC: 8.9 10*3/uL (ref 4.0–10.5)

## 2014-04-08 LAB — ETHANOL: Alcohol, Ethyl (B): <11 mg/dL (ref 0–11)

## 2014-04-08 MED ORDER — LISINOPRIL 20 MG PO TABS: 20.0000 mg | ORAL_TABLET | Freq: Every morning | ORAL | Status: DC

## 2014-04-08 MED ORDER — ROPINIROLE HCL 0.25 MG PO TABS
0.2500 mg | ORAL_TABLET | Freq: Every day | ORAL | Status: DC
  Administered 2014-04-08: 0.25 mg via ORAL
  Filled 2014-04-08: qty 1

## 2014-04-08 MED ORDER — TRAZODONE HCL 100 MG PO TABS
100.0000 mg | ORAL_TABLET | Freq: Every evening | ORAL | Status: DC | PRN
  Administered 2014-04-09 (×2): 100 mg via ORAL
  Filled 2014-04-08 (×2): qty 1

## 2014-04-08 MED ORDER — ROPINIROLE HCL 0.25 MG PO TABS
0.2500 mg | ORAL_TABLET | Freq: Every day | ORAL | Status: DC
  Administered 2014-04-09 – 2014-04-15 (×7): 0.25 mg via ORAL
  Filled 2014-04-08 (×12): qty 1

## 2014-04-08 MED ORDER — PANTOPRAZOLE SODIUM 40 MG PO TBEC
40.0000 mg | DELAYED_RELEASE_TABLET | Freq: Every day | ORAL | Status: DC
  Administered 2014-04-09 – 2014-04-16 (×8): 40 mg via ORAL
  Filled 2014-04-08 (×10): qty 1

## 2014-04-08 MED ORDER — CITALOPRAM HYDROBROMIDE 20 MG PO TABS
30.0000 mg | ORAL_TABLET | Freq: Every day | ORAL | Status: DC
  Administered 2014-04-09 – 2014-04-16 (×8): 30 mg via ORAL
  Filled 2014-04-08 (×10): qty 1

## 2014-04-08 MED ORDER — LISINOPRIL 20 MG PO TABS
20.0000 mg | ORAL_TABLET | Freq: Every morning | ORAL | Status: DC
  Administered 2014-04-09 – 2014-04-16 (×7): 20 mg via ORAL
  Filled 2014-04-08 (×11): qty 1

## 2014-04-08 MED ORDER — IBUPROFEN 200 MG PO TABS
400.0000 mg | ORAL_TABLET | Freq: Once | ORAL | Status: AC
  Administered 2014-04-08: 400 mg via ORAL
  Filled 2014-04-08: qty 2

## 2014-04-08 MED ORDER — CITALOPRAM HYDROBROMIDE 20 MG PO TABS
30.0000 mg | ORAL_TABLET | Freq: Every day | ORAL | Status: DC
  Administered 2014-04-08: 30 mg via ORAL
  Filled 2014-04-08: qty 1

## 2014-04-08 MED ORDER — PANTOPRAZOLE SODIUM 40 MG PO TBEC
40.0000 mg | DELAYED_RELEASE_TABLET | Freq: Every day | ORAL | Status: DC
  Administered 2014-04-08: 40 mg via ORAL
  Filled 2014-04-08: qty 1

## 2014-04-08 MED ORDER — TRAZODONE HCL 100 MG PO TABS: 100.0000 mg | ORAL_TABLET | Freq: Every evening | ORAL | Status: DC | PRN
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
## 2014-04-08 NOTE — ED Provider Notes (Signed)
CSN: [id number]     Arrival date & time 04/08/14  1914 History   First MD Initiated Contact with Patient 04/08/14 1937     Chief Complaint  Patient presents with  . Medical Clearance     (Consider location/radiation/quality/duration/timing/severity/associated sxs/prior Treatment) The history is provided by the patient.  pt with hx anxiety, panic attacks, depression, c/o worsening depression in the past week since being discharged from behavioral health. States was feeling a bit better at d/c, but now feeling much worse. Feels anxious, panicky, and very depressed. Denies thoughts of harm to self or others. States compliant w her normal meds. Is eating and drining normally, normal appetite. States up at night w racing thoughts. States physical health at baseline, no new c/o or recent illness.       Past Medical History  Diagnosis Date  . Hypertension    Past Surgical History  Procedure Laterality Date  . Abdominal hysterectomy    . Cholecystectomy    . Back surgery     No family history on file. History  Substance Use Topics  . Smoking status: Never Smoker   . Smokeless tobacco: Never Used  . Alcohol Use: No   OB History   Grav Para Term Preterm Abortions TAB SAB Ect Mult Living                 Review of Systems  Constitutional: Negative for fever and chills.  HENT: Negative for sore throat.   Eyes: Negative for redness.  Respiratory: Negative for shortness of breath.   Cardiovascular: Negative for chest pain.  Gastrointestinal: Negative for vomiting and abdominal pain.  Genitourinary: Negative for flank pain.  Musculoskeletal: Negative for back pain and neck pain.  Skin: Negative for rash.  Neurological: Negative for headaches.  Hematological: Does not bruise/bleed easily.  Psychiatric/Behavioral: Positive for dysphoric mood. The patient is nervous/anxious.       Allergies  Ceftin  Home Medications   Prior to Admission medications   Medication Sig Start  Date End Date Taking? Authorizing Provider  celecoxib (CELEBREX) 200 MG capsule Take 1 capsule (200 mg total) by mouth 2 (two) times daily. 03/31/14   Verne SpurrNeil Mashburn, PA-C  citalopram (CELEXA) 10 MG tablet Take 3 tablets (30 mg total) by mouth daily. 03/31/14   Verne SpurrNeil Mashburn, PA-C  lisinopril (PRINIVIL,ZESTRIL) 20 MG tablet Take 1 tablet (20 mg total) by mouth every morning. 03/31/14   Verne SpurrNeil Mashburn, PA-C  RABEprazole (ACIPHEX) 20 MG tablet Take 1 tablet (20 mg total) by mouth every morning. 03/31/14   Verne SpurrNeil Mashburn, PA-C  rOPINIRole (REQUIP) 0.25 MG tablet Take 1 tablet (0.25 mg total) by mouth at bedtime. 03/31/14   Verne SpurrNeil Mashburn, PA-C  traZODone (DESYREL) 100 MG tablet Take 1 tablet (100 mg total) by mouth at bedtime as needed for sleep. 03/31/14   Verne SpurrNeil Mashburn, PA-C   BP 148/94  Pulse 73  Temp(Src) 98 F (36.7 C) (Oral)  Resp 18  SpO2 99% Physical Exam  Nursing note and vitals reviewed. Constitutional: She appears well-developed and well-nourished. No distress.  HENT:  Mouth/Throat: Oropharynx is clear and moist.  Eyes: Conjunctivae are normal. Pupils are equal, round, and reactive to light. No scleral icterus.  Neck: Neck supple. No tracheal deviation present. No thyromegaly present.  Cardiovascular: Normal rate, regular rhythm, normal heart sounds and intact distal pulses.   Pulmonary/Chest: Effort normal and breath sounds normal. No respiratory distress.  Abdominal: Soft. Normal appearance and bowel sounds are normal. She exhibits no distension. There  is no tenderness.  Musculoskeletal: She exhibits no edema and no tenderness.  Neurological: She is alert.  Skin: Skin is warm and dry. No rash noted. She is not diaphoretic.  Psychiatric:  Anxious. Denies SI.     ED Course  Procedures (including critical care time)  Results for orders placed during the hospital encounter of 04/08/14  CBC      Result Value Ref Range   WBC 8.9  4.0 - 10.5 K/uL   RBC 4.03  3.87 - 5.11 MIL/uL    Hemoglobin 11.8 (*) 12.0 - 15.0 g/dL   HCT 16.134.8 (*) 09.636.0 - 04.546.0 %   MCV 86.4  78.0 - 100.0 fL   MCH 29.3  26.0 - 34.0 pg   MCHC 33.9  30.0 - 36.0 g/dL   RDW 40.912.9  81.111.5 - 91.415.5 %   Platelets 310  150 - 400 K/uL  COMPREHENSIVE METABOLIC PANEL      Result Value Ref Range   Sodium 142  137 - 147 mEq/L   Potassium 3.9  3.7 - 5.3 mEq/L   Chloride 103  96 - 112 mEq/L   CO2 29  19 - 32 mEq/L   Glucose, Bld 101 (*) 70 - 99 mg/dL   BUN 13  6 - 23 mg/dL   Creatinine, Ser 7.820.83  0.50 - 1.10 mg/dL   Calcium 9.4  8.4 - 95.610.5 mg/dL   Total Protein 6.7  6.0 - 8.3 g/dL   Albumin 3.6  3.5 - 5.2 g/dL   AST 16  0 - 37 U/L   ALT 23  0 - 35 U/L   Alkaline Phosphatase 81  39 - 117 U/L   Total Bilirubin 0.3  0.3 - 1.2 mg/dL   GFR calc non Af Amer 80 (*) >90 mL/min   GFR calc Af Amer >90  >90 mL/min  URINE RAPID DRUG SCREEN (HOSP PERFORMED)      Result Value Ref Range   Opiates NONE DETECTED  NONE DETECTED   Cocaine NONE DETECTED  NONE DETECTED   Benzodiazepines NONE DETECTED  NONE DETECTED   Amphetamines NONE DETECTED  NONE DETECTED   Tetrahydrocannabinol NONE DETECTED  NONE DETECTED   Barbiturates NONE DETECTED  NONE DETECTED  ETHANOL      Result Value Ref Range   Alcohol, Ethyl (B) <11  0 - 11 mg/dL       MDM  Labs.  Reviewed nursing notes and prior charts for additional history.   Psych team consulted.  Psych team/[facility] indicates pt accepted back to their observational area.    Suzi RootsKevin E Delonna Ney, MD 04/08/14 2153

## 2014-04-08 NOTE — Discharge Instructions (Signed)
[facility]

## 2014-04-08 NOTE — BH Assessment (Signed)
[facility] by Renata Capriceonrad, NP pending medical clearance.

## 2014-04-08 NOTE — ED Notes (Signed)
[facility] which [facility] once cleared. Pt reports that she is having anxiety, headache, and feels nauseated. Pt reports feeling hopeless. The patient states "I just don't want to be here anymore," however when asked about SI, the patient states "I don't think I could go through with it." Pt denies having a plan for SI or having HI. Pt denies using alcohol or any recreational drugs as well as having auditory or visual hallucinations.

## 2014-04-09 DIAGNOSIS — F332 Major depressive disorder, recurrent severe without psychotic features: Secondary | ICD-10-CM | POA: Diagnosis present

## 2014-04-09 MED ORDER — IBUPROFEN 600 MG PO TABS
600.0000 mg | ORAL_TABLET | Freq: Four times a day (QID) | ORAL | Status: DC | PRN
  Administered 2014-04-09 – 2014-04-16 (×18): 600 mg via ORAL
  Filled 2014-04-09 (×18): qty 1

## 2014-04-09 MED ORDER — BENZOCAINE 10 % MT GEL
Freq: Four times a day (QID) | OROMUCOSAL | Status: DC | PRN
  Administered 2014-04-09 (×2): 1 via OROMUCOSAL
  Administered 2014-04-10: 18:00:00 via OROMUCOSAL
  Filled 2014-04-09: qty 9.4

## 2014-04-09 NOTE — Progress Notes (Signed)
Admission Note:  Pt admitted from the OBS unit D: Pt appeared depressed  With  a flat affect.  Pt passive SI-contracts, denies HI/ AVH at this time. Pt stated she was not able to make an [facility], but [facility] was [facility], last time.   A: Pt admitted to unit per protocol, skin assessment and search done before going to obs unit.  Pt  educated on therapeutic milieu rules. Pt was introduced to milieu by nursing staff.    R: Pt was receptive to education about the milieu .  15 min safety checks started. Clinical research associatewriter offered support

## 2014-04-09 NOTE — Progress Notes (Signed)
This is a 53 years old Caucasian female admitted to the unit for depression. Patient reported that she was discharged from the unit on the 15th of April 2015. She said she has not been feeling well since she returned home, she had insomnia, crying spells, panic attacks and other symptoms of depression. 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 She also said she has been having suicide thoughts although no plan. Although she denied any during the assessment. Denied hallucinations and denied substance abuse. She also endorsed poor concentrations.Skin assessment, speech and thought process were all within normal limit. Writer encouraged and supported patient. Q 15 minute check initiated. Offered patient meal and beverage.

## 2014-04-09 NOTE — Progress Notes (Signed)
Patient ID: Brittany Cain, female   DOB: 1961-01-17, 53 y.o.   MRN: [id number] Pt depressed and tearful. Reports being upset because was unable to visit longer with sister and husband. Therapeutic emotional support provided. Pt was allowed to discuss her stressful work relationships. Pt denies SI/HI, -A/V hall. Contracts for safety. Will continue to monitor for evaluation and stabilization.

## 2014-04-09 NOTE — Progress Notes (Addendum)
[facility] INPATIENT:  Family/Significant Other Suicide Prevention Education  Suicide Prevention Education:  Education Completed; 04/08/14,  French Ana(Tracy "sister") has been identified by the patient as the family member/significant other with whom the patient will be residing, and identified as the person(s) who will aid the patient in the event of a mental health crisis (suicidal ideations/suicide attempt).  With written consent from the patient, the family member/significant other has been provided the following suicide prevention education, prior to the and/or following the discharge of the patient.  The suicide prevention education provided includes the following:  Suicide risk factors  Suicide prevention and interventions  National Suicide Hotline telephone number  [facility] assessment telephone number  [facility] Emergency Assistance 911  [facility] and/or Residential Mobile Crisis Unit telephone number  Celene KrasSandra G Robinson 04/09/2014, 1:53 AM

## 2014-04-09 NOTE — Tx Team (Signed)
Initial Interdisciplinary Treatment Plan  PATIENT STRENGTHS: (choose at least two) General fund of knowledge  PATIENT STRESSORS: Medication change or noncompliance   PROBLEM LIST: Problem List/Patient Goals Date to be addressed Date deferred Reason deferred Estimated date of resolution  Risk for Suicide      Depression                                                 DISCHARGE CRITERIA:  Improved stabilization in mood, thinking, and/or behavior  PRELIMINARY DISCHARGE PLAN: Attend aftercare/continuing care group Attend PHP/IOP  PATIENT/FAMIILY INVOLVEMENT: This treatment plan has been presented to and reviewed with the patient, Brittany Cain.  The patient and family have been given the opportunity to ask questions and make suggestions.  Brittany Cain 04/09/2014, 9:54 PM

## 2014-04-09 NOTE — Progress Notes (Signed)
D: Patient denies SI/HI and auditory and visual hallucinations. States she has a lot of job related anxiety. Some tearfulness.Patient has a depressed mood and affect. Complaining of toothache and of pain in head and neck.  A: Patient given emotional support from RN. Patient given medications per MD orders. . Patient encouraged to come to staff with any questions or concerns.  R: Patient remains cooperative and appropriate. Will continue to monitor patient for safety.
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
## 2014-04-09 NOTE — Progress Notes (Signed)
Pt discharged to [facility]. No complaints voiced. Verbally contracts for safety.

## 2014-04-09 NOTE — Progress Notes (Signed)
Adult Psychoeducational Group Note  Date:  04/09/2014 Time:  10:43 PM  Group Topic/Focus:  Wrap-Up Group:   The focus of this group is to help patients review their daily goal of treatment and discuss progress on daily workbooks.  Participation Level:  Did Not Attend  Participation Quality:  NA  Affect:  NA  Cognitive:  NA  Insight: None  Engagement in Group:  NA  Modes of Intervention:  Education, Socialization and Support  Additional Comments:  Pt did not attend group because, pt had not yet been admitted.  Brittany Cain 04/09/2014, 10:43 PM

## 2014-04-10 DIAGNOSIS — R45851 Suicidal ideations: Secondary | ICD-10-CM

## 2014-04-10 MED ORDER — GABAPENTIN 100 MG PO CAPS
ORAL_CAPSULE | ORAL | Status: AC
  Filled 2014-04-10: qty 1

## 2014-04-10 MED ORDER — TRAZODONE HCL 150 MG PO TABS
150.0000 mg | ORAL_TABLET | Freq: Every evening | ORAL | Status: DC | PRN
  Administered 2014-04-10 – 2014-04-14 (×5): 150 mg via ORAL
  Filled 2014-04-10 (×5): qty 1
  Filled 2014-04-10: qty 3
  Filled 2014-04-10: qty 1

## 2014-04-10 MED ORDER — GABAPENTIN 100 MG PO CAPS
100.0000 mg | ORAL_CAPSULE | Freq: Three times a day (TID) | ORAL | Status: DC
  Administered 2014-04-10 – 2014-04-16 (×18): 100 mg via ORAL
  Filled 2014-04-10 (×5): qty 1
  Filled 2014-04-10: qty 9
  Filled 2014-04-10 (×12): qty 1
  Filled 2014-04-10: qty 9
  Filled 2014-04-10 (×3): qty 1
  Filled 2014-04-10: qty 9
  Filled 2014-04-10 (×2): qty 1
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
## 2014-04-10 NOTE — Progress Notes (Signed)
Adult Psychoeducational Group Note  Date:  04/10/2014 Time:  0900  Group Topic/Focus:  Conflict Resolution:   The focus of this group is to discuss the conflict resolution process and how it may be used upon discharge.  Participation Level:  Did Not Attend despite staff encouragement    Brittany Cain 04/10/2014, 1:30 PM

## 2014-04-10 NOTE — [facility] Suicide Risk Assessment (Signed)
Suicide Risk Assessment  Admission Assessment     Nursing information obtained from:  Patient Demographic factors:  Caucasian;Access to firearms Current Mental Status:  NA Loss Factors:  NA Historical Factors:  Family history of mental illness or substance abuse Risk Reduction Factors:  Employed;Living with another person, especially a relative Total Time spent with patient: 1 hour  CLINICAL FACTORS:   Depression:   Anhedonia Hopelessness Dysthymia Unstable or Poor Therapeutic Relationship Previous Psychiatric Diagnoses and Treatments  Psychiatric Specialty Exam:     Blood pressure 143/99, pulse 66, temperature 97.5 F (36.4 C), temperature source Oral, resp. rate 18, height 5' 4.25" (1.632 m), weight 102.967 kg (227 lb).Body mass index is 38.66 kg/(m^2).  General Appearance: Casual  Eye Contact::  Poor  Speech:  Slow  Volume:  Decreased  Mood:  Depressed and Dysphoric  Affect:  Congruent  Thought Process:  Linear  Orientation:  Full (Time, Place, and Person)  Thought Content:  Rumination  Suicidal Thoughts:  Yes.  without intent/plan  Homicidal Thoughts:  No  Memory:  Recent;   Fair  Judgement:  Impaired  Insight:  Shallow  Psychomotor Activity:  Decreased  Concentration:  Fair  Recall:  [employer]  Fund of Knowledge:Fair  Language: Fair  Akathisia:  Negative  Handed:  Right  AIMS (if indicated):     Assets:  Desire for Improvement Intimacy  Sleep:  Number of Hours: 5.75   Musculoskeletal: Strength & Muscle Tone: within normal limits Gait & Station: normal Patient leans: Front  COGNITIVE FEATURES THAT CONTRIBUTE TO RISK:  Closed-mindedness Polarized thinking    SUICIDE RISK:   Moderate:  Frequent suicidal ideation with limited intensity, and duration, some specificity in terms of plans, no associated intent, good self-control, limited dysphoria/symptomatology, some risk factors present, and identifiable protective factors, including available and accessible  social support.  PLAN OF CARE:  I certify that inpatient services furnished can reasonably be expected to improve the patient's condition.  Thresa RossNadeem Fronia Depass MD 04/10/2014, 10:07 AM

## 2014-04-10 NOTE — Progress Notes (Signed)
Patient ID: Brittany Cain, female   DOB: 1961/06/27, 53 y.o.   MRN: [id number] D: Pt is awake and active on the unit this AM. Pt denies A/V hallucinations but endorses passive SI. Pt is able to contract for safety. Pt rates their depression at 8 and hopelessness at 8. Pt's mood is depressed and her affect is sad. Pt writes she plans to eat better and would like medication for tooth and sciatic pain. Pt states that she did not sleep well last night, and was feeling drowsy/lightheaded after breakfast this AM. Pt v/s are WNL.    A: Staff escorted pt to her bed by wheelchair. Pt slept for several hours and felt much better afterwards. Encouraged pt to express needs with staff and administered medication per MD orders. Writer also encouraged pt to participate in groups.  R: Pt mood and affect are improved this afternoon likely due to increased rest. Writer will continue to monitor. 15 minute checks are ongoing for safety.

## 2014-04-10 NOTE — Progress Notes (Signed)
[facility] Group Notes:  (Nursing/MHT/Case Management/Adjunct)  Date:  04/10/2014  Time:  11:54 PM  Type of Therapy:  Group Therapy  Participation Level:  Minimal  Participation Quality:  Appropriate  Affect:  Appropriate  Cognitive:  Appropriate  Insight:  Appropriate  Engagement in Group:  Engaged  Modes of Intervention:  Limit-setting, Socialization and Support  Summary of Progress/Problems: Pt. Attended group and was engaged in group discussion.  Sondra ComeRyan J Alvia Jablonski 04/10/2014, 11:54 PM

## 2014-04-10 NOTE — [facility] Group Notes (Signed)
[facility] LCSW Group Therapy Note  04/10/2014 / 1:15 PM  Type of Therapy and Topic:  Group Therapy: Avoiding Self-Sabotaging and Enabling Behaviors  Participation Level:  Active   Mood: depressed  Description of Group:     Learn how to identify obstacles, self-sabotaging and enabling behaviors, what are they, why do we do them and what needs do these behaviors meet? Discuss unhealthy relationships and how to have positive healthy boundaries with those that sabotage and enable. Explore aspects of self-sabotage and enabling in yourself and how to limit these self-destructive behaviors in everyday life.  Therapeutic Goals: 1. Patient will identify one obstacle that relates to self-sabotage and enabling behaviors 2. Patient will identify one personal self-sabotaging or enabling behavior they did prior to admission 3. Patient able to establish a plan to change the above identified behavior they did prior to admission:  4. Patient will demonstrate ability to communicate their needs through discussion and/or role plays.   Summary of Patient Progress: The main focus of today's process group was to explain to the adolescent what "self-sabotage" means and use Motivational Interviewing to discuss what benefits, negative or positive, were involved in a self-identified self-sabotaging behavior. We then talked about stages of change flow chart and each patient identified where they were along with their self sabotaging behavior if any.  Patient shared that she is looking forwards to returning to work as she feels that will be a sign she is doing better. [name] shared that she feels she is in action stage yet evidence doesn't support that as she was readmitted due to self harm thoughts. Patient reports anger at her sister/sister in law yet later states she did the right thing. Patient processed what doing the "right thing" for herself would feel like.   Therapeutic Modalities:   Cognitive Behavioral  Therapy Person-Centered Therapy Motivational Interviewing   Carney Bernatherine C Harrill, LCSW
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
## 2014-04-11 NOTE — Progress Notes (Signed)
D: Pt denies SI/HI/AVH. Pt is pleasant and cooperative. Pt feeling better than she did yesterday and is hopeful that the Neurotin helps her so she can get back to exercising.   A: Pt was offered support and encouragement. Pt was given scheduled medications. Pt was encourage to attend groups. Q 15 minute checks were done for safety.   R:Pt attends groups and interacts well with peers and staff. Pt is taking medication. Pt has no complaints at this time.Pt receptive to treatment and safety maintained on unit.

## 2014-04-11 NOTE — [facility] Group Notes (Signed)
[facility] Group Notes:  (Nursing/MHT/Case Management/Adjunct)  Date:  04/11/2014  Time:  1040  Type of Therapy:  Psychoeducational Skills--Healthy Support Systems  Participation Level:  Active  Participation Quality:  Appropriate  Affect:  Anxious and Depressed  Cognitive:  Appropriate  Insight:  Improving  Engagement in Group:  Engaged and Limited  Modes of Intervention:  Discussion, Education and Exploration  Summary of Progress/Problems:The focus of this group was designed to help the patient identify triggers, unhealthy supports to replace with healthy support systems through recovery with mental illness.   Brittany Cain 04/11/2014, 12:35 PM

## 2014-04-11 NOTE — Progress Notes (Signed)
Adult Psychoeducational Group Note  Date:  04/11/2014 Time:  9:41 PM  Group Topic/Focus:  Wrap-Up Group:   The focus of this group is to help patients review their daily goal of treatment and discuss progress on daily workbooks.  Participation Level:  Active  Participation Quality:  Appropriate  Affect:  Appropriate  Cognitive:  Appropriate  Insight: Appropriate  Engagement in Group:  Engaged  Modes of Intervention:  Support  Additional Comments:  Pt stated that positive thing that happened today was that she was able to sleep well last night which is something she has not been able to do in a year and that her husband came to visit her and that he is trying to be more supportive and work on their relationship  Jessamyn Watterson 04/11/2014, 9:41 PM

## 2014-04-11 NOTE — Progress Notes (Signed)
D: Pt denies SI/HI/AVH. Pt is pleasant and cooperative. Pt optimistic and happy that she "got something for this siatica pain". Pt said she felt good that her meds are being adjusted.   A: Pt was offered support and encouragement. Pt was given scheduled medications. Pt was encourage to attend groups. Q 15 minute checks were done for safety.   R:Pt attends groups and interacts well with peers and staff. Pt is taking medication. Pt receptive to treatment and safety maintained on unit.

## 2014-04-11 NOTE — Progress Notes (Signed)
Patient ID: Brittany Cain, female   DOB: Aug 31, 1961, 53 y.o.   MRN: [id number] [facility]  04/11/2014 7:36 PM Brittany Cain  MRN:  [id number]  Subjective:  Pt seen and chart reviewed. Pt denies SI, HI, and AVH, contracts for safety. Pt affirms participation in group therapy and is learning coping strategies from this participation. Pt reports good sleep and good appetite as well and is satisfied with current medication management.     Diagnosis:   DSM5: Schizophrenia Disorders:   Obsessive-Compulsive Disorders:   Trauma-Stressor Disorders:  Posttraumatic Stress Disorder (309.81) Substance/Addictive Disorders:   Depressive Disorders:  Major Depressive Disorder - Severe (296.23) Total Time spent with patient: 30 minutes  Axis I: Generalized Anxiety Disorder, Major Depression, Recurrent severe and Post Traumatic Stress Disorder Axis II: Deferred Axis IV: housing problems, other psychosocial or environmental problems, problems with access to health care services and problems with primary support group Axis V: 51-60 moderate symptoms  ADL's:  Intact  Sleep: Fair  Appetite:  Good  Suicidal Ideation:  Plan:  Denies Intent:  Denies Means:  Denies Homicidal Ideation:  Plan:  Denies Intent:  Denies Means:  Denies AEB (as evidenced by):  Psychiatric Specialty Exam: Physical Exam  Constitutional: She is oriented to person, place, and time. She appears well-developed.  Eyes: Pupils are equal, round, and reactive to light.  Neck: Normal range of motion.  GI: Soft. Bowel sounds are normal.  Musculoskeletal: Normal range of motion.  Neurological: She is alert and oriented to person, place, and time.  Skin: Skin is warm and dry.  Psychiatric: She has a normal mood and affect. Her behavior is normal.    Review of Systems  Psychiatric/Behavioral: Positive for depression and suicidal ideas. The patient is nervous/anxious.     Blood pressure 112/80, pulse 91, temperature 97.8  F (36.6 C), temperature source Oral, resp. rate 18, height 5' 4.25" (1.632 m), weight 102.967 kg (227 lb).Body mass index is 38.66 kg/(m^2).  General Appearance: Casual, Fairly Groomed and Neat  Eye Contact::  Fair  Speech:  Clear and Coherent and Normal Rate  Volume:  Decreased  Mood:  Anxious and Depressed  Affect:  Depressed and Flat  Thought Process:  Circumstantial and Intact  Orientation:  Full (Time, Place, and Person)  Thought Content:  WDL  Suicidal Thoughts:  No  Homicidal Thoughts:  No  Memory:  Immediate;   Fair Recent;   Fair Remote;   Fair  Judgement:  Intact  Insight:  Fair and Lacking  Psychomotor Activity:  Restlessness  Concentration:  Fair  Recall:  Good  Fund of Knowledge:Fair  Language: Good  Akathisia:  No  Handed:  Right  AIMS (if indicated):     Assets:  Communication Skills Desire for Improvement Financial Resources/Insurance Housing Talents/Skills  Sleep:  Number of Hours: 6.25   Musculoskeletal: Strength & Muscle Tone: within normal limits Gait & Station: normal Patient leans: N/A  Current Medications: Current Facility-Administered Medications  Medication Dose Route Frequency Provider Last Rate Last Dose  . benzocaine (ORAJEL) 10 % mucosal gel   Mouth/Throat QID PRN Court Joyharles E Kober, PA-C      . citalopram (CELEXA) tablet 30 mg  30 mg Oral Daily Kristeen MansFran E Hobson, NP   30 mg at 04/11/14 0815  . gabapentin (NEURONTIN) capsule 100 mg  100 mg Oral TID Beau FannyJohn C Withrow, FNP   100 mg at 04/11/14 1731  . ibuprofen (ADVIL,MOTRIN) tablet 600 mg  600 mg Oral Q6H PRN Janetta Horaharles E  Eloisa NorthernKober, PA-C   600 mg at 04/11/14 1535  . lisinopril (PRINIVIL,ZESTRIL) tablet 20 mg  20 mg Oral q morning - 10a Kristeen MansFran E Hobson, NP   20 mg at 04/11/14 0815  . pantoprazole (PROTONIX) EC tablet 40 mg  40 mg Oral Daily Kristeen MansFran E Hobson, NP   40 mg at 04/11/14 0815  . rOPINIRole (REQUIP) tablet 0.25 mg  0.25 mg Oral QHS Kristeen MansFran E Hobson, NP   0.25 mg at 04/10/14 2210  . traZODone (DESYREL)  tablet 150 mg  150 mg Oral QHS PRN Beau FannyJohn C Withrow, FNP   150 mg at 04/10/14 2209    Lab Results: No results found for this or any previous visit (from the past 48 hour(s)).  Physical Findings: AIMS: Facial and Oral Movements Muscles of Facial Expression: None, normal Lips and Perioral Area: None, normal Jaw: None, normal Tongue: None, normal,Extremity Movements Upper (arms, wrists, hands, fingers): None, normal Lower (legs, knees, ankles, toes): None, normal, Trunk Movements Neck, shoulders, hips: None, normal, Overall Severity Severity of abnormal movements (highest score from questions above): None, normal Incapacitation due to abnormal movements: None, normal Patient's awareness of abnormal movements (rate only patient's report): No Awareness, Dental Status Current problems with teeth and/or dentures?: Yes (complaint of tooth pain) Does patient usually wear dentures?: No  CIWA:    COWS:     Treatment Plan Summary: Daily contact with patient to assess and evaluate symptoms and progress in treatment Medication management  Plan Review of chart, vital signs, medications, and notes.  1-Individual and group therapy  2-Medication management for depression and anxiety: Medications reviewed with the patient and she stated no untoward effects, unchanged. 3-Coping skills for depression, anxiety  4-Continue crisis stabilization and management  5-Address health issues--monitoring vital signs, stable  6-Treatment plan in progress to prevent relapse of depression and anxiety  Medical Decision Making Problem Points:  Established problem, stable/improving (1), Review of last therapy session (1) and Review of psycho-social stressors (1) Data Points:  Review or order clinical lab tests (1) Review or order medicine tests (1) Review of medication regiment & side effects (2) Review of new medications or change in dosage (2)  I certify that inpatient services furnished can reasonably be  expected to improve the patient's condition.    Beau FannyJohn C Withrow, FNP-BC 04/11/2014 7:36 PM I agreed with findings and treatment plan of this patient

## 2014-04-11 NOTE — [facility] Group Notes (Signed)
[facility] Group Notes:  (Nursing/MHT/Case Management/Adjunct)  Date:  04/11/2014  Time:  1030  Type of Therapy:  Psychoeducational Skills--Self Inventory Review with RN  Participation Level:  Minimal  Participation Quality:  Appropriate  Affect:  Anxious and Depressed  Cognitive:  Appropriate  Insight:  Improving  Engagement in Group:  Engaged, Lacking and Limited  Modes of Intervention:  Discussion, Education and Exploration  Summary of Progress/Problems:  Brittany LimesLinsey Unique Cain 04/11/2014, 12:34 PM

## 2014-04-11 NOTE — [facility] Group Notes (Signed)
[facility] LCSW Group Therapy Note   04/11/2014 1:15 PM   Type of Therapy and Topic: Group Therapy: Feelings Around Returning Home & Establishing a Supportive Framework and Activity to Identify signs of Improvement or Decompensation    Participation Level: Engaged  Mood: depressed  Description of Group:  Patients first processed thoughts and feelings about up coming discharge. These included fears of upcoming changes, lack of change, new living environments, judgements and expectations from others and overall stigma of MH issues. We then discussed what is a supportive framework? What does it look like feel like and how do I discern it from and unhealthy non-supportive network? Learn how to cope when supports are not helpful and don't support you. Discuss what to do when your family/friends are not supportive.   Therapeutic Goals Addressed in Processing Group:  1. Patient will identify one healthy supportive network that they can use at discharge. 2. Patient will identify one factor of a supportive framework and how to tell it from an unhealthy network. 3. Patient able to identify one coping skill to use when they do not have positive supports from others. 4. Patient will demonstrate ability to communicate their needs through discussion and/or role plays.  Summary of Patient Progress:  Summary of Progress/Problems: The main focus of today's process group was to identify the patient's current support system and decide on other supports that can be put in place. An emphasis was placed on using counselor, doctor, therapy groups, 12-step groups, and problem-specific support groups to expand supports. There was also an extensive discussion about what constitutes a healthy support versus an unhealthy support.  The patient expressed understanding of the concepts presented, and agreed that while supports are important for some she and her sister support one another without need of anyone's help. Pt later  expressed concern of physician's comments that "sister is not your best support." Patient admits that they rehash previous trauma and re experience emotions without exploring other options. Patient agreeable to following up with therapist upon discharge and exploring other support groups.    Carney Bernatherine C Harrill, LCSW

## 2014-04-12 MED ORDER — ARIPIPRAZOLE 5 MG PO TABS
5.0000 mg | ORAL_TABLET | Freq: Every day | ORAL | Status: DC
  Administered 2014-04-12 – 2014-04-16 (×5): 5 mg via ORAL
  Filled 2014-04-12 (×4): qty 1
  Filled 2014-04-12: qty 3
  Filled 2014-04-12 (×3): qty 1

## 2014-04-12 NOTE — Progress Notes (Signed)
Patient ID: Brittany Cain, female   DOB: 05/27/1961, 53 y.o.   MRN: [id number] [facility] MD Progress Note  04/12/2014 5:07 PM Brittany Cain  MRN:  [id number]  Subjective: Met with Naira today and found that she felt that she left the hospital too early last time. She discusses being in a rage over other people's behavior that she is exposed to. She denies SI/HI but states she just wanted to Rage at people in [place]. She is interested in starting Abilify as it has been on TV and seems to work.  Diagnosis:   DSM5: Schizophrenia Disorders:   Obsessive-Compulsive Disorders:   Trauma-Stressor Disorders:  Posttraumatic Stress Disorder (309.81) Substance/Addictive Disorders:   Depressive Disorders:  Major Depressive Disorder - Severe (296.23) Total Time spent with patient: 30 minutes  Axis I: Generalized Anxiety Disorder, Major Depression, Recurrent severe and Post Traumatic Stress Disorder Axis II: Deferred Axis IV: housing problems, other psychosocial or environmental problems, problems with access to health care services and problems with primary support group Axis V: 51-60 moderate symptoms  ADL's:  Intact  Sleep: Fair  Appetite:  Good  Suicidal Ideation:  Plan:  Denies Intent:  Denies Means:  Denies Homicidal Ideation:  Plan:  Denies Intent:  Denies Means:  Denies AEB (as evidenced by):  Psychiatric Specialty Exam: Physical Exam  Constitutional: She is oriented to person, place, and time. She appears well-developed.  Eyes: Pupils are equal, round, and reactive to light.  Neck: Normal range of motion.  GI: Soft. Bowel sounds are normal.  Musculoskeletal: Normal range of motion.  Neurological: She is alert and oriented to person, place, and time.  Skin: Skin is warm and dry.  Psychiatric: She has a normal mood and affect. Her behavior is normal.    Review of Systems  Psychiatric/Behavioral: Positive for depression and suicidal ideas. The patient is nervous/anxious.      Blood pressure 124/84, pulse 71, temperature 97.4 F (36.3 C), temperature source Oral, resp. rate 18, height 5' 4.25" (1.632 m), weight 102.967 kg (227 lb).Body mass index is 38.66 kg/(m^2).  General Appearance: Casual, Fairly Groomed and Neat  Eye Contact::  Fair  Speech:  Clear and Coherent and Normal Rate  Volume:  Decreased  Mood:  Anxious and Depressed  Affect:  Depressed and Flat  Thought Process:  Circumstantial and Intact  Orientation:  Full (Time, Place, and Person)  Thought Content:  WDL  Suicidal Thoughts:  No  Homicidal Thoughts:  No  Memory:  Immediate;   Fair Recent;   Fair Remote;   Fair  Judgement:  Intact  Insight:  Fair and Lacking  Psychomotor Activity:  Restlessness  Concentration:  Fair  Recall:  Good  Fund of Knowledge:Fair  Language: Good  Akathisia:  No  Handed:  Right  AIMS (if indicated):     Assets:  Communication Skills Desire for Improvement Financial Resources/Insurance Housing Talents/Skills  Sleep:  Number of Hours: 5.75   Musculoskeletal: Strength & Muscle Tone: within normal limits Gait & Station: normal Patient leans: N/A  Current Medications: Current Facility-Administered Medications  Medication Dose Route Frequency Provider Last Rate Last Dose  . benzocaine (ORAJEL) 10 % mucosal gel   Mouth/Throat QID PRN Dara Hoyer, PA-C      . citalopram (CELEXA) tablet 30 mg  30 mg Oral Daily Lurena Nida, NP   30 mg at 04/12/14 0741  . gabapentin (NEURONTIN) capsule 100 mg  100 mg Oral TID Benjamine Mola, FNP   100 mg at 04/12/14 1628  .  ibuprofen (ADVIL,MOTRIN) tablet 600 mg  600 mg Oral Q6H PRN Dara Hoyer, PA-C   600 mg at 04/12/14 7680  . lisinopril (PRINIVIL,ZESTRIL) tablet 20 mg  20 mg Oral q morning - 10a Lurena Nida, NP   20 mg at 04/12/14 1128  . pantoprazole (PROTONIX) EC tablet 40 mg  40 mg Oral Daily Lurena Nida, NP   40 mg at 04/12/14 0742  . rOPINIRole (REQUIP) tablet 0.25 mg  0.25 mg Oral QHS Lurena Nida, NP    0.25 mg at 04/11/14 2147  . traZODone (DESYREL) tablet 150 mg  150 mg Oral QHS PRN Benjamine Mola, FNP   150 mg at 04/11/14 2242    Lab Results: No results found for this or any previous visit (from the past 48 hour(s)).  Physical Findings: AIMS: Facial and Oral Movements Muscles of Facial Expression: None, normal Lips and Perioral Area: None, normal Jaw: None, normal Tongue: None, normal,Extremity Movements Upper (arms, wrists, hands, fingers): None, normal Lower (legs, knees, ankles, toes): None, normal, Trunk Movements Neck, shoulders, hips: None, normal, Overall Severity Severity of abnormal movements (highest score from questions above): None, normal Incapacitation due to abnormal movements: None, normal Patient's awareness of abnormal movements (rate only patient's report): No Awareness, Dental Status Current problems with teeth and/or dentures?: No Does patient usually wear dentures?: No  CIWA:    COWS:  COWS Total Score: 1  Treatment Plan Summary: Daily contact with patient to assess and evaluate symptoms and progress in treatment Medication management  Plan NEW: 1. Will start Abilify 51m po qd for adjunctive therapy for MDD. 2. Will continue remainder of treatment plan as written at this time. 3. Disposition in progress.   Review of chart, vital signs, medications, and notes.  1-Individual and group therapy  2-Medication management for depression and anxiety: Medications reviewed with the patient and she stated no untoward effects, unchanged. 3-Coping skills for depression, anxiety  4-Continue crisis stabilization and management  5-Address health issues--monitoring vital signs, stable  6-Treatment plan in progress to prevent relapse of depression and anxiety  Medical Decision Making Problem Points:  Established problem, stable/improving (1), Review of last therapy session (1) and Review of psycho-social stressors (1) Data Points:  Review or order clinical lab  tests (1) Review or order medicine tests (1) Review of medication regiment & side effects (2) Review of new medications or change in dosage (2)  I certify that inpatient services furnished can reasonably be expected to improve the patient's condition.  NMarlane Hatcher Mashburn RPAC 5:11 PM 04/12/2014  Agree with assessment and plan IGeralyn FlashA. LSabra Heck M.D.

## 2014-04-12 NOTE — Tx Team (Signed)
Interdisciplinary Treatment Plan Update   Date Reviewed:  04/12/2014  Time Reviewed:  11:00 AM  Progress in Treatment:   Attending groups: Yes Participating in groups: Yes Taking medication as prescribed: Yes  Tolerating medication: Yes Family/Significant other contact made:  No, but will ask patient for consent for collateral contact Patient understands diagnosis: Yes  Discussing patient identified problems/goals with staff: Yes Medical problems stabilized or resolved: Yes Denies suicidal/homicidal ideation: Yes Patient has not harmed self or others: Yes  For review of initial/current patient goals, please see plan of care.  Estimated Length of Stay:  2-3 days  Reasons for Continued Hospitalization:  Anxiety Depression Medication stabilization Suicidal ideation  New Problems/Goals identified:    Discharge Plan or Barriers:   [facility]  Additional Comments:  Attendees:  Patient:  04/12/2014 11:00 AM   Signature:  04/12/2014 11:00 AM  Signature:  Verne SpurrNeil Mashburn, PA 04/12/2014 11:00 AM  Signature:  Liborio NixonPatrice White, RN 04/12/2014 11:00 AM  Signature:Beverly Terrilee CroakKnight, RN 04/12/2014 11:00 AM  Signature:  Neill Loftarol Davis RN 04/12/2014 11:00 AM  Signature:  Juline PatchQuylle Manraj Yeo, LCSW 04/12/2014 11:00 AM  Signature:  Reyes Ivanhelsea Horton, LCSW 04/12/2014 11:00 AM  Signature:  Leisa LenzValerie Enoch, Care Coordinator [facility] 04/12/2014 11:00 AM  Signature:  Stephan MinisterMorgan Armstrong, PA Student 04/12/2014 11:00 AM  Signature: 04/12/2014  11:00 AM  Signature:   Onnie BoerJennifer Clark, RN URCM 04/12/2014  11:00 AM  Signature: 04/12/2014  11:00 AM    Scribe for Treatment Team:   Juline PatchQuylle Harrie Cazarez,  04/12/2014 11:00 AM

## 2014-04-12 NOTE — [facility] Group Notes (Signed)
[facility]   04/12/2014 9:55 AM    Participation Quality:  Appropraite  Mood/Affect:  Appropriate  Depression Rating:  8  Anxiety Rating:  9  Thoughts of Suicide:  No  Will you contract for safety?   NA  Current AVH:  No  Plan for Discharge/Comments:  Patient attended discharge planning group and actively participated in group.  She will follow up with [facility] for outpatient services.CSW provided all participants with daily workbook.   Transportation Means: Patient has transportation.   Supports:  Patient has a support system.   Aldrich Lloyd, Joesph JulyQuylle Hairston

## 2014-04-12 NOTE — [facility] Group Notes (Addendum)
[facility] LCSW Group Therapy          Overcoming Obstacles       1:15 -2:30        04/12/2014   3:38 PM     Type of Therapy:  Group Therapy  Participation Level:  Appropriate  Participation Quality:  Appropriate  Affect:  Appropriate, Alert  Cognitive:  Attentive Appropriate  Insight: Developing/Improving Engaged  Engagement in Therapy: Developing/Imprvoing Engaged  Modes of Intervention:  Discussion Exploration  Education Rapport BuildingProblem-Solving Support  Summary of Progress/Problems:  The main focus of today's group was overcoming obstacles.  Patient shared the obstacle she needs to overcome is her job.  Patient advised she believes the job to be too strenuous for her.  Patient was encouraged to make a decision to find another job as she stated they need the income to meet the mortgage or to make a decision to make the job work in spite of the difficulties.  Patient able to identify appropriate coping skills.   [name], Chevy Sweigert Hairston 04/12/2014   3:38 PM

## 2014-04-12 NOTE — Progress Notes (Signed)
D:  Patient's self inventory sheet, patient has fair sleep, good appetite, normal energy level, good attention span.  Rated depression 4, hopeless 2, anxiety 8.  Denied withdrawals.  Has experienced lightheadedness, pain, headache in past 24 hours.  Pain goal #4, worst pain #8.  Plans to shower, eat better, exercise.  Does not want to take protonix, takes prilosec 40 mg bid at home.  No assistance needed with meds after discharge.  Stated she needs stronger pain medication. A:  Medications administered per MD orders.  Emotional support and encouragement given patient. R:  Denied SI and HI.  Denied A/V hallucinations.  Will continue to monitor patient for safety with 15 minute checks.  Safety maintained.

## 2014-04-12 NOTE — Progress Notes (Signed)
Adult Psychoeducational Group Note  Date:  04/12/2014 Time:  9:27 PM  Group Topic/Focus:  Wrap-Up Group:   The focus of this group is to help patients review their daily goal of treatment and discuss progress on daily workbooks.  Participation Level:  Active  Participation Quality:  Appropriate  Affect:  Appropriate  Cognitive:  Appropriate  Insight: Appropriate  Engagement in Group:  Engaged  Modes of Intervention:  Support  Additional Comments:  One positive thing that happened today was that her sister came to see her, who she also considers to be her support system   Brittany Cain 04/12/2014, 9:27 PM

## 2014-04-12 NOTE — Progress Notes (Signed)
Patient endorsed feeling better that she felt in the morning. She appeared to have some mood swings. She endorsed SI but contracted for safety. He sister visited and she seemed happy when her sister got here. Writer encouraged and supported patient. Q 15 minute check continues as ordered to maintain safety.

## 2014-04-13 NOTE — [facility] Suicide Risk Assessment (Signed)
[facility] INPATIENT:  Family/Significant Other Suicide Prevention Education  Suicide Prevention Education:  Education Completed; Brittany Cain, Husband [phone] has been identified by the patient as the family member/significant other with whom the patient will be residing, and identified as the person(s) who will aid the patient in the event of a mental health crisis (suicidal ideations/suicide attempt).  With written consent from the patient, the family member/significant other has been provided the following suicide prevention education, prior to the and/or following the discharge of the patient.  The suicide prevention education provided includes the following:  Suicide risk factors  Suicide prevention and interventions  National Suicide Hotline telephone number  [facility] assessment telephone number  [facility] Assistance 911  [facility] and/or [facility]idential [facility] telephone number  Request made of family/significant other to:  Remove weapons (e.g., guns, rifles, knives), all items previously/currently identified as safety concern.  Husband advised guns are secured.  Remove drugs/medications (over-the-counter, prescriptions, illicit drugs), all items previously/currently identified as a safety concern.  The family member/significant other verbalizes understanding of the suicide prevention education information provided.  The family member/significant other agrees to remove the items of safety concern listed above.  Brittany Cain Brittany Cain 04/13/2014, 1:16 PM

## 2014-04-13 NOTE — Progress Notes (Signed)
Patient ID: Brittany Cain, female   DOB: [birth year], 53 y.o.   MRN: [id number] Brittany Cain  04/13/2014 11:24 AM Brittany Cain  MRN:  [id number]  Subjective: Met with the patient this morning to discuss her treatment and progress. She notes that she slept very well last night. Her appetite is normal. States her depression is a 5/10 which is down from an 8/10.  And anxiety is a 6/10, which is down from a 9/10.  She denies SI/HI/AVH. She smiles easily and makes good eye contact. She is worried about filling out the FMLA forms and getting them in to her employer.    Diagnosis:   DSM5: Schizophrenia Disorders:   Obsessive-Compulsive Disorders:   Trauma-Stressor Disorders:  Posttraumatic Stress Disorder (309.81) Substance/Addictive Disorders:   Depressive Disorders:  Major Depressive Disorder - Severe (296.23) Total Time spent with patient: 30 minutes  Axis I: Generalized Anxiety Disorder, Major Depression, Recurrent severe and Post Traumatic Stress Disorder Axis II: Deferred Axis IV: housing problems, other psychosocial or environmental problems, problems with access to health care services and problems with primary support group Axis V: 51-60 moderate symptoms  ADL's:  Intact  Sleep: Fair  Appetite:  Good  Suicidal Ideation:  Plan:  Denies Intent:  Denies Means:  Denies Homicidal Ideation:  Plan:  Denies Intent:  Denies Means:  Denies AEB (as evidenced by):  Psychiatric Specialty Exam: Physical Exam  Constitutional: She is oriented to person, place, and time. She appears well-developed.  Eyes: Pupils are equal, round, and reactive to light.  Neck: Normal range of motion.  GI: Soft. Bowel sounds are normal.  Musculoskeletal: Normal range of motion.  Neurological: She is alert and oriented to person, place, and time.  Skin: Skin is warm and dry.  Psychiatric: She has a normal mood and affect. Her behavior is normal.    Review of Systems  Psychiatric/Behavioral:  Positive for depression and suicidal ideas. The patient is nervous/anxious.     Blood pressure 113/80, pulse 79, temperature 97.7 F (36.5 C), temperature source Oral, resp. rate 18, height 5' 4.25" (1.632 m), weight 102.967 kg (227 lb).Body mass index is 38.66 kg/(m^2).  General Appearance: Casual, Fairly Groomed and Neat  Eye Contact::  Fair  Speech:  Clear and Coherent and Normal Rate  Volume:  Decreased  Mood:  Anxious and Depressed  Affect:  improving  Thought Process:  Circumstantial and Intact  Orientation:  Full (Time, Place, and Person)  Thought Content:  WDL  Suicidal Thoughts:  No  Homicidal Thoughts:  No  Memory:  Immediate;   Fair Recent;   Fair Remote;   Fair  Judgement:  Intact  Insight:  Fair and Lacking  Psychomotor Activity:  Restlessness  Concentration:  Fair  Recall:  Good  Fund of Knowledge:Fair  Language: Good  Akathisia:  No  Handed:  Right  AIMS (if indicated):     Assets:  Communication Skills Desire for Improvement Financial Resources/Insurance Housing Talents/Skills  Sleep:  Number of Hours: 6.25   Musculoskeletal: Strength & Muscle Tone: within normal limits Gait & Station: normal Patient leans: N/A  Current Medications: Current Facility-Administered Medications  Medication Dose Route Frequency Provider Last Rate Last Dose  . benzocaine (ORAJEL) 10 % mucosal gel   Mouth/Throat QID PRN Dara Hoyer, PA-C      . citalopram (CELEXA) tablet 30 mg  30 mg Oral Daily Lurena Nida, NP   30 mg at 04/12/14 0741  . gabapentin (NEURONTIN) capsule 100 mg  100  mg Oral TID Benjamine Mola, FNP   100 mg at 04/12/14 1628  . ibuprofen (ADVIL,MOTRIN) tablet 600 mg  600 mg Oral Q6H PRN Dara Hoyer, PA-C   600 mg at 04/12/14 2952  . lisinopril (PRINIVIL,ZESTRIL) tablet 20 mg  20 mg Oral q morning - 10a Lurena Nida, NP   20 mg at 04/12/14 1128  . pantoprazole (PROTONIX) EC tablet 40 mg  40 mg Oral Daily Lurena Nida, NP   40 mg at 04/12/14 0742  .  rOPINIRole (REQUIP) tablet 0.25 mg  0.25 mg Oral QHS Lurena Nida, NP   0.25 mg at 04/11/14 2147  . traZODone (DESYREL) tablet 150 mg  150 mg Oral QHS PRN Benjamine Mola, FNP   150 mg at 04/11/14 2242    Lab Results: No results found for this or any previous visit (from the past 48 hour(s)).  Physical Findings: AIMS: Facial and Oral Movements Muscles of Facial Expression: None, normal Lips and Perioral Area: None, normal Jaw: None, normal Tongue: None, normal,Extremity Movements Upper (arms, wrists, hands, fingers): None, normal Lower (legs, knees, ankles, toes): None, normal, Trunk Movements Neck, shoulders, hips: None, normal, Overall Severity Severity of abnormal movements (highest score from questions above): None, normal Incapacitation due to abnormal movements: None, normal Patient's awareness of abnormal movements (rate only patient's report): No Awareness, Dental Status Current problems with teeth and/or dentures?: No Does patient usually wear dentures?: No  CIWA:  CIWA-Ar Total: 2 COWS:  COWS Total Score: 1  Treatment Plan Summary: Daily contact with patient to assess and evaluate symptoms and progress in treatment Medication management  Plan NEW: 1. Continue Abilify 56m po qd for adjunctive therapy for MDD. 2. Will continue remainder of treatment plan as written at this time. 3. Patient will complete her portion of the FM[place] forms [place]  4.Disposition in progress.   Review of chart, vital signs, medications, and notes.  1-Individual and group therapy  2-Medication management for depression and anxiety: Medications reviewed with the patient and she stated no untoward effects, unchanged. 3-Coping skills for depression, anxiety  4-Continue crisis stabilization and management  5-Address health issues--monitoring vital signs, stable  6-Treatment plan in progress to prevent relapse of depression and anxiety  Medical Decision  Making Problem Points:  Established problem, stable/improving (1), Review of last therapy session (1) and Review of psycho-social stressors (1) Data Points:  Review or order clinical lab tests (1) Review or order medicine tests (1) Review of medication regiment & side effects (2) Review of new medications or change in dosage (2)  I certify that inpatient services furnished can reasonably be expected to improve the patient's condition.  NMarlane Hatcher Mashburn RPAC 11:24 AM 04/13/2014  Agree with assessment and plan IGeralyn FlashA. LSabra Heck M.D.

## 2014-04-13 NOTE — Progress Notes (Signed)
D:  Patient's self inventory sheet, patient has fair sleep, good appetite, low energy level, good attention span.  Rated depression #5, hopeless 3, anxiety #5.  Denied withdrawals.  Denied SI.  Has experienced headache in past 24 hours.  Worst pain 5, pain goal 3.  Plans to eat better, exercise, shower, think positive in future.  Plans to go home after discharge.  No problems taking medications after discharge. A:  Medications administered per MD orders.  Emotional support and encouragement given patient. R:  Denied SI and HI.  Denied A/V hallucinations.  Will continue to monitor patient for safety with 15 minute checks.  Safety maintained.

## 2014-04-13 NOTE — Progress Notes (Signed)
Recreation Therapy Notes  Animal-Assisted Activity/Therapy (AAA/T) Program Checklist/Progress Notes Patient Eligibility Criteria Checklist & Daily Group note for Rec Tx Intervention  Date: 04.28.2015 Time: 2:45pm Location: [address] Dayroom    AAA/T Program Assumption of Risk Form signed by Patient/ or Parent Legal Guardian yes  Patient is free of allergies or sever asthma yes  Patient reports no fear of animals yes  Patient reports no history of cruelty to animals yes   Patient understands his/her participation is voluntary yes  Behavioral Response: DID NOT ATTEND   Brittany Cain, LRT/CTRS  Brittany Cain 04/13/2014 4:41 PM 

## 2014-04-13 NOTE — Progress Notes (Signed)
The focus of this group is to educate the patient on the purpose and policies of crisis stabilization and provide a format to answer questions about their admission.  The group details unit policies and expectations of patients while admitted.  Patient attended 0900 nurse education orientation group this morning.  Patient listened attentively, alert, appropriate affect, appropriate insight and engagement.  Today patient will work on 3 goals for discharge.  

## 2014-04-13 NOTE — [facility] Group Notes (Signed)
[facility] LCSW Group Therapy  Emotional Regulation 1:15 - 2: 30 PM        04/13/2014     Type of Therapy:  Group Therapy  Participation Level:  Minimal  Participation Quality:  Appropriate  Affect: Depressed  Cognitive:Appropriate  Insight:  Developing/Improving  Engagement in Therapy:  Developing/Improving Modes of Intervention:  Discussion Exploration Problem-Solving Supportive  Summary of Progress/Problems:  Group topic was emotional regulations.  Patient listened and nodded in agreement but did not engage in the discussion. [name], Macdonald Rigor Hairston 04/13/2014

## 2014-04-14 DIAGNOSIS — F4310 Post-traumatic stress disorder, unspecified: Secondary | ICD-10-CM

## 2014-04-14 MED ORDER — CELECOXIB 200 MG PO CAPS
200.0000 mg | ORAL_CAPSULE | Freq: Every day | ORAL | Status: DC
  Administered 2014-04-14 – 2014-04-16 (×3): 200 mg via ORAL
  Filled 2014-04-14: qty 2
  Filled 2014-04-14 (×5): qty 1

## 2014-04-14 NOTE — Progress Notes (Signed)
Patient ID: Brittany Cain, female   DOB: 1961-03-16, 53 y.o.   MRN: [id number] [facility] MD Progress Note  04/14/2014 12:58 PM Brittany Cain  MRN:  [id number]  Subjective: Met with the patient who reports that she is doing better and that things are leveling out more. She rates her depression as a 4/10 which is down from an 8. Her anxiety is a 6/10 which is also down from a 9. She has completed her portion of the FMLA paper work and would like our help completing the remainder.  She denies SI/HI and her rage issues are decreasing. She is concerned about possibly having a side effect from the Abilify. Diagnosis:   DSM5: Schizophrenia Disorders:   Obsessive-Compulsive Disorders:   Trauma-Stressor Disorders:  Posttraumatic Stress Disorder (309.81) Substance/Addictive Disorders:   Depressive Disorders:  Major Depressive Disorder - Severe (296.23) Total Time spent with patient: 30 minutes  Axis I: Generalized Anxiety Disorder, Major Depression, Recurrent severe and Post Traumatic Stress Disorder Axis II: Deferred Axis IV: housing problems, other psychosocial or environmental problems, problems with access to health care services and problems with primary support group Axis V: 51-60 moderate symptoms  ADL's:  Intact  Sleep: Fair  Appetite:  Good  Suicidal Ideation:  Plan:  Denies Intent:  Denies Means:  Denies Homicidal Ideation:  Plan:  Denies Intent:  Denies Means:  Denies AEB (as evidenced by):  Psychiatric Specialty Exam: Physical Exam  Constitutional: She is oriented to person, place, and time. She appears well-developed.  Eyes: Pupils are equal, round, and reactive to light.  Neck: Normal range of motion.  GI: Soft. Bowel sounds are normal.  Musculoskeletal: Normal range of motion.  Neurological: She is alert and oriented to person, place, and time.  Skin: Skin is warm and dry.  Psychiatric: She has a normal mood and affect. Her behavior is normal.    Review of Systems   Psychiatric/Behavioral: Positive for depression and suicidal ideas. The patient is nervous/anxious.     Blood pressure 132/88, pulse 69, temperature 97.6 F (36.4 C), temperature source Oral, resp. rate 17, height 5' 4.25" (1.632 m), weight 102.967 kg (227 lb).Body mass index is 38.66 kg/(m^2).  General Appearance: Casual, Fairly Groomed and Neat  Eye Contact::  Fair  Speech:  Clear and Coherent and Normal Rate  Volume:  Decreased  Mood:  Anxious and Depressed  Affect:  improving  Thought Process:  Circumstantial and Intact  Orientation:  Full (Time, Place, and Person)  Thought Content:  WDL  Suicidal Thoughts:  No  Homicidal Thoughts:  No  Memory:  Immediate;   Fair Recent;   Fair Remote;   Fair  Judgement:  Intact  Insight:  Fair and Lacking  Psychomotor Activity:  Restlessness  Concentration:  Fair  Recall:  Good  Fund of Knowledge:Fair  Language: Good  Akathisia:  No  Handed:  Right  AIMS (if indicated):     Assets:  Communication Skills Desire for Improvement Financial Resources/Insurance Housing Talents/Skills  Sleep:  Number of Hours: 6.25   Musculoskeletal: Strength & Muscle Tone: within normal limits Gait & Station: normal Patient leans: N/A  Current Medications: Current Facility-Administered Medications  Medication Dose Route Frequency Provider Last Rate Last Dose  . benzocaine (ORAJEL) 10 % mucosal gel   Mouth/Throat QID PRN Dara Hoyer, PA-C      . citalopram (CELEXA) tablet 30 mg  30 mg Oral Daily Lurena Nida, NP   30 mg at 04/12/14 0741  . gabapentin (NEURONTIN) capsule 100  mg  100 mg Oral TID Benjamine Mola, FNP   100 mg at 04/12/14 1628  . ibuprofen (ADVIL,MOTRIN) tablet 600 mg  600 mg Oral Q6H PRN Dara Hoyer, PA-C   600 mg at 04/12/14 6568  . lisinopril (PRINIVIL,ZESTRIL) tablet 20 mg  20 mg Oral q morning - 10a Lurena Nida, NP   20 mg at 04/12/14 1128  . pantoprazole (PROTONIX) EC tablet 40 mg  40 mg Oral Daily Lurena Nida, NP   40 mg  at 04/12/14 0742  . rOPINIRole (REQUIP) tablet 0.25 mg  0.25 mg Oral QHS Lurena Nida, NP   0.25 mg at 04/11/14 2147  . traZODone (DESYREL) tablet 150 mg  150 mg Oral QHS PRN Benjamine Mola, FNP   150 mg at 04/11/14 2242    Lab Results: No results found for this or any previous visit (from the past 48 hour(s)).  Physical Findings: AIMS: Facial and Oral Movements Muscles of Facial Expression: None, normal Lips and Perioral Area: None, normal Jaw: None, normal Tongue: None, normal,Extremity Movements Upper (arms, wrists, hands, fingers): None, normal Lower (legs, knees, ankles, toes): None, normal, Trunk Movements Neck, shoulders, hips: None, normal, Overall Severity Severity of abnormal movements (highest score from questions above): None, normal Incapacitation due to abnormal movements: None, normal Patient's awareness of abnormal movements (rate only patient's report): No Awareness, Dental Status Current problems with teeth and/or dentures?: No Does patient usually wear dentures?: No  CIWA:  CIWA-Ar Total: 2 COWS:  COWS Total Score: 1  Treatment Plan Summary: Daily contact with patient to assess and evaluate symptoms and progress in treatment Medication management  Plan: 1. Reassured patient regarding side effects, risks, benefits and questions are answered regarding Abilify. 1. Continue Abilify 58m po qd for adjunctive therapy for MDD. 2. Will continue remainder of treatment plan as written at this time. 3. Will complete our portion of the FMLA paper work. 4.Disposition in progress.     Review of chart, vital signs, medications, and notes.  1-Individual and group therapy  2-Medication management for depression and anxiety: Medications reviewed with the patient and she stated no untoward effects, unchanged. 3-Coping skills for depression, anxiety  4-Continue crisis stabilization and management  5-Address health issues--monitoring vital signs, stable  6-Treatment plan in  progress to prevent relapse of depression and anxiety  Medical Decision Making Problem Points:  Established problem, stable/improving (1), Review of last therapy session (1) and Review of psycho-social stressors (1) Data Points:  Review or order clinical lab tests (1) Review or order medicine tests (1) Review of medication regiment & side effects (2) Review of new medications or change in dosage (2)  I certify that inpatient services furnished can reasonably be expected to improve the patient's condition.  NMarlane Hatcher Mashburn RBanner [facility]4/29/2015 3:25 PM  Agree with assessment and plan IGeralyn FlashA. LSabra Heck M.D.

## 2014-04-14 NOTE — Tx Team (Signed)
Interdisciplinary Treatment Plan Update   Date Reviewed:  04/14/2014  Time Reviewed:  8:40 AM  Progress in Treatment:   Attending groups: Yes Participating in groups: Yes Taking medication as prescribed: Yes  Tolerating medication: Yes Family/Significant other contact made:  Yes, collateral contact with husband. Patient understands diagnosis: Yes  Discussing patient identified problems/goals with staff: Yes Medical problems stabilized or resolved: Yes Denies suicidal/homicidal ideation: Yes Patient has not harmed self or others: Yes  For review of initial/current patient goals, please see plan of care.  Estimated Length of Stay: 2 days  Reasons for Continued Hospitalization:  Anxiety Depression Medication stabilization   New Problems/Goals identified:    Discharge Plan or Barriers:   [facility]  Additional Comments:  Attendees:  Patient:  04/14/2014 8:40 AM   Signature:  04/14/2014 8:40 AM  Signature:  Verne SpurrNeil Mashburn, PA 04/14/2014 8:40 AM  Signature:  Liborio NixonPatrice White, RN 04/14/2014 8:40 AM  Signature: Linton RumpAmanda Lawson, RN  04/14/2014 8:40 AM  Signature:  Harold Barbanonecia Byrd,  RN 04/14/2014 8:40 AM  Signature:  Juline PatchQuylle Ambri Miltner, LCSW 04/14/2014 8:40 AM  Signature:  Reyes Ivanhelsea Horton, LCSW 04/14/2014 8:40 AM  Signature:  Leisa LenzValerie Enoch, Care Coordinator [facility] 04/14/2014 8:40 AM  Signature:   04/14/2014 8:40 AM  Signature: 04/14/2014  8:40 AM  Signature:   Onnie BoerJennifer Clark, RN URCM 04/14/2014  8:40 AM  Signature: 04/14/2014  8:40 AM    Scribe for Treatment Team:   Juline PatchQuylle Madilynne Mullan,  04/14/2014 8:40 AM

## 2014-04-14 NOTE — [facility] Group Notes (Signed)
Adult Psychoeducational Group Note  Date:  04/14/2014 Time:  10:21 PM  Group Topic/Focus:  Wrap-Up Group:   The focus of this group is to help patients review their daily goal of treatment and discuss progress on daily workbooks.  Participation Level:  Active  Participation Quality:  Appropriate  Affect:  Appropriate  Cognitive:  Appropriate  Insight: Appropriate  Engagement in Group:  Limited  Modes of Intervention:  Discussion  Additional Comments:  [name] said her day was pretty good.  She said she slept good last night and went outside today.  She said she is getting discharged Friday.  Rianna Lukes A Lillia AbedLindsay 04/14/2014, 10:21 PM

## 2014-04-14 NOTE — [facility] Group Notes (Addendum)
[facility] LCSW Group Therapy  Emotional Regulation 1:15 - 2: 30 PM        04/14/2014  3:40 PM    Type of Therapy:  Group Therapy  Participation Level:  Appropriate  Participation Quality:  Appropriate  Affect:  Appropriate  Cognitive:  Attentive Appropriate  Insight:  Developing/Improving Engaged  Engagement in Therapy:  Developing/Improving Engaged  Modes of Intervention:  Discussion Exploration Problem-Solving Supportive  Summary of Progress/Problems:  Group topic was emotional regulations.  Patient participated in the discussion and was able to identify an emotion that needed to regulated. She advised of the need to regulate her anxiety.  Patient shared she is very uncomfortable with strangers and often feels they are judging her.  Patient was able to identify approprite coping skills.  [name], Brittany Cain 04/14/2014 3:40 PM

## 2014-04-14 NOTE — Progress Notes (Addendum)
Adult Psychoeducational Group Note  Date:  04/14/2014 Time:  12:38 PM  Group Topic/Focus:  Personal Choices and Values:   The focus of this group is to help patients assess and explore the importance of values in their lives, how their values affect their decisions, how they express their values and what opposes their expression.  Participation Level:  Active  Participation Quality:  Appropriate, Attentive and Supportive  Affect:  Appropriate  Cognitive:  Appropriate  Insight: Appropriate  Engagement in Group:  Engaged and Supportive  Modes of Intervention:  Discussion and Support  Additional Comments:  Pts discussed values that are important to them and how sometimes we get out of line in our daily lives with our values. Pts talked about goals they could make to help get them back on track with their values. Pt stated her sobriety and her relationship with god are important values to her. Pt stated her mother has been the reason she has fallen away from the church. Writer encouraged pt that she may not be able to get the support that she wants from her mother and that she may need to try getting that kind of support from someone else like her sister.  Brittany CorwinDana C Rosangela Cain 04/14/2014, 12:38 PM

## 2014-04-14 NOTE — Progress Notes (Signed)
Adult Psychoeducational Group Note  Date:  04/13/2014 Time:  11:40 PM  Group Topic/Focus:  Wrap-Up Group:   The focus of this group is to help patients review their daily goal of treatment and discuss progress on daily workbooks.  Participation Level:  Active  Participation Quality:  Appropriate  Affect:  Appropriate  Cognitive:  Appropriate  Insight: Appropriate  Engagement in Group:  Engaged  Modes of Intervention:  Discussion  Additional Comments:  Pt was present for wrap up group. She stated that she learned that she needs to and can take things one day at a time. She said that she had a good day because her parents came to visit. She says they have been able to work through some issues in their relationship since she was admitted here.   Calene Paradiso A Ka Flammer 04/13/2014, 11:40 PM

## 2014-04-14 NOTE — Progress Notes (Signed)
D: Patient appropriate and cooperative with staff and peers. Patient's affect is depressed and mood is anxious. She reported on the self inventory sheet that she's sleeping well, appetite and ability to pay attention are both good, and energy level is low. Patient rates depression "4" and feelings of hopelessness "2". She's interacting with peers in the dayroom and participating in groups. Compliant with all medications.  A: Support and encouragement provided to patient. Scheduled medications administered per MD orders. Maintain Q15 minute checks for safety.  R: Patient receptive. Denies SI/HI/AVH. Patient remains safe.

## 2014-04-14 NOTE — Progress Notes (Signed)
D: Pt denies SI/HI/AVH. Pt is pleasant and cooperative. Pt stated she slept good last night, she woke up with no pain.   A: Pt was offered support and encouragement. Pt was given scheduled medications. Pt was encourage to attend groups. Q 15 minute checks were done for safety.   R:Pt attends groups and interacts well with peers and staff. Pt is taking medication. Pt has no complaints at this time .Pt receptive to treatment and safety maintained on unit.

## 2014-04-14 NOTE — [facility] Group Notes (Signed)
[facility]   04/14/2014 11:36 AM    Participation Quality:  Appropraite  Mood/Affect:  Appropriate  Depression Rating:  4  Anxiety Rating:  6  Thoughts of Suicide:  No  Will you contract for safety?   NA  Current AVH:  No  Plan for Discharge/Comments:  Patient attended discharge planning group and actively participated in group.  Patient to follow with [facility] on discharge.  She advised of not feeling safe for discharge at this time. CSW provided all participants with daily workbook.   Transportation Means: Patient has transportation.   Supports:  Patient has a support system.   Voshon Petro, Joesph JulyQuylle Hairston

## 2014-04-15 NOTE — Progress Notes (Signed)
Patient ID: Brittany Cain Hoffmann, female   DOB: 1961-04-13, 53 y.o.   MRN: [id number] [facility] MD Progress Note  04/15/2014 1:05 PM Brittany Cain  MRN:  [id number]  Subjective: Pt seen and chart reviewed. Pt denies SI, HI, and AVH, contracts for safety. Pt reports that "this morning, I was a 9/10 on anxiety, but now I'm down to a 3/10. I know I might crash again and get worse now that I'm back, but the basketball and shooting hoops out there really helped". Pt reports good sleep and good appetite as well as reported benefit from group therapy.   Diagnosis:   DSM5: Schizophrenia Disorders:   Obsessive-Compulsive Disorders:   Trauma-Stressor Disorders:  Posttraumatic Stress Disorder (309.81) Substance/Addictive Disorders:   Depressive Disorders:  Major Depressive Disorder - Severe (296.23) Total Time spent with patient: 30 minutes  Axis I: Generalized Anxiety Disorder, Major Depression, Recurrent severe and Post Traumatic Stress Disorder Axis II: Deferred Axis IV: housing problems, other psychosocial or environmental problems, problems with access to health care services and problems with primary support group Axis V: 51-60 moderate symptoms  ADL's:  Intact  Sleep: Fair  Appetite:  Good  Suicidal Ideation:  Plan:  Denies Intent:  Denies Means:  Denies Homicidal Ideation:  Plan:  Denies Intent:  Denies Means:  Denies AEB (as evidenced by):  Psychiatric Specialty Exam: Physical Exam  Constitutional: She is oriented to person, place, and time. She appears well-developed.  Eyes: Pupils are equal, round, and reactive to light.  Neck: Normal range of motion.  GI: Soft. Bowel sounds are normal.  Musculoskeletal: Normal range of motion.  Neurological: She is alert and oriented to person, place, and time.  Skin: Skin is warm and dry.  Psychiatric: She has a normal mood and affect. Her behavior is normal.    Review of Systems  Psychiatric/Behavioral: Positive for depression. The patient is  nervous/anxious.     Blood pressure 144/88, pulse 91, temperature 97.8 F (36.6 C), temperature source Oral, resp. rate 16, height 5' 4.25" (1.632 m), weight 102.967 kg (227 lb).Body mass index is 38.66 kg/(m^2).  General Appearance: Casual, Fairly Groomed and Neat  Eye Contact::  Fair  Speech:  Clear and Coherent and Normal Rate  Volume:  Decreased  Mood:  Anxious  Affect:  improving  Thought Process:  Circumstantial and Intact  Orientation:  Full (Time, Place, and Person)  Thought Content:  WDL  Suicidal Thoughts:  No  Homicidal Thoughts:  No  Memory:  Immediate;   Fair Recent;   Fair Remote;   Fair  Judgement:  Intact  Insight:  Fair and Lacking  Psychomotor Activity:  Restlessness  Concentration:  Fair  Recall:  Good  Fund of Knowledge:Fair  Language: Good  Akathisia:  No  Handed:  Right  AIMS (if indicated):     Assets:  Communication Skills Desire for Improvement Financial Resources/Insurance Housing Talents/Skills  Sleep:  Number of Hours: 6.75   Musculoskeletal: Strength & Muscle Tone: within normal limits Gait & Station: normal Patient leans: N/A  Current Medications: Current Facility-Administered Medications  Medication Dose Route Frequency Provider Last Rate Last Dose  . benzocaine (ORAJEL) 10 % mucosal gel   Mouth/Throat QID PRN Court Joyharles E Kober, PA-C      . citalopram (CELEXA) tablet 30 mg  30 mg Oral Daily Kristeen MansFran E Hobson, NP   30 mg at 04/12/14 0741  . gabapentin (NEURONTIN) capsule 100 mg  100 mg Oral TID Beau FannyJohn C Withrow, FNP   100 mg at 04/12/14  1628  . ibuprofen (ADVIL,MOTRIN) tablet 600 mg  600 mg Oral Q6H PRN Court Joyharles E Kober, PA-C   600 mg at 04/12/14 [id number]  . lisinopril (PRINIVIL,ZESTRIL) tablet 20 mg  20 mg Oral q morning - 10a Kristeen MansFran E Hobson, NP   20 mg at 04/12/14 1128  . pantoprazole (PROTONIX) EC tablet 40 mg  40 mg Oral Daily Kristeen MansFran E Hobson, NP   40 mg at 04/12/14 0742  . rOPINIRole (REQUIP) tablet 0.25 mg  0.25 mg Oral QHS Kristeen MansFran E Hobson, NP   0.25  mg at 04/11/14 2147  . traZODone (DESYREL) tablet 150 mg  150 mg Oral QHS PRN Beau FannyJohn C Withrow, FNP   150 mg at 04/11/14 2242    Lab Results: No results found for this or any previous visit (from the past 48 hour(s)).  Physical Findings: AIMS: Facial and Oral Movements Muscles of Facial Expression: None, normal Lips and Perioral Area: None, normal Jaw: None, normal Tongue: None, normal,Extremity Movements Upper (arms, wrists, hands, fingers): None, normal Lower (legs, knees, ankles, toes): None, normal, Trunk Movements Neck, shoulders, hips: None, normal, Overall Severity Severity of abnormal movements (highest score from questions above): None, normal Incapacitation due to abnormal movements: None, normal Patient's awareness of abnormal movements (rate only patient's report): No Awareness, Dental Status Current problems with teeth and/or dentures?: No Does patient usually wear dentures?: No  CIWA:  CIWA-Ar Total: 2 COWS:  COWS Total Score: 1  Treatment Plan Summary: Daily contact with patient to assess and evaluate symptoms and progress in treatment Medication management  Plan: 1. Reassured patient regarding side effects, risks, benefits and questions are answered regarding Abilify. 1. Continue Abilify 5mg  po qd for adjunctive therapy for MDD.  2. Will continue remainder of treatment plan as written at this time.  3. Will complete our portion of the FMLA paper work.  4.Disposition in progress.   Review of chart, vital signs, medications, and notes.  1-Individual and group therapy  2-Medication management for depression and anxiety: Medications reviewed with the patient and she stated no untoward effects, unchanged. 3-Coping skills for depression, anxiety  4-Continue crisis stabilization and management  5-Address health issues--monitoring vital signs, stable  6-Treatment plan in progress to prevent relapse of depression and anxiety  Medical Decision Making Problem Points:   Established problem, stable/improving (1), Review of last therapy session (1) and Review of psycho-social stressors (1)  Data Points:  Review or order clinical lab tests (1) Review or order medicine tests (1) Review of medication regiment & side effects (2) Review of new medications or change in dosage (2)  I certify that inpatient services furnished can reasonably be expected to improve the patient's condition.  Beau FannyJohn C Withrow, FNP-BC  04/15/2014 1:05 PM  Agree with assessment and plan Madie RenoIrving A. Dub MikesLugo, M.D.

## 2014-04-15 NOTE — Progress Notes (Signed)
D: Patient has appropriate affect and depressed mood. She reported on the self inventory sheet that she's sleeping well, appetite and ability to pay attention are good and energy level is low. Patient rated depression "4" and feelings of hopelessness "2". Continues to attend groups throughout the day. Adheres to medication regimen.  A: Support and encouragement provided to patient. Administered scheduled medications per ordering MD. Monitor Q15 minute checks for safety.  R: Patient receptive. Denies SI/HI and auditory/visual hallucinations. Patient remains safe on the unit.

## 2014-04-15 NOTE — Progress Notes (Signed)
Adult Psychoeducational Group Note  Date:  04/15/2014 Time:  11:47 AM  Group Topic/Focus:  Building Self Esteem:   The Focus of this group is helping patients become aware of the effects of self-esteem on their lives, the things they and others do that enhance or undermine their self-esteem, seeing the relationship between their level of self-esteem and the choices they make and learning ways to enhance self-esteem.  Participation Level:  Active  Participation Quality:  Appropriate and Attentive  Affect:  Appropriate  Cognitive:  Alert and Appropriate  Insight: Good  Engagement in Group:  Engaged  Modes of Intervention:  Activity, Discussion, Education, Exploration, Socialization and Support  Additional Comments:  Pt came to group and shared that negative self-talk, not taking care of herself, and lack of hygiene are negatively affecting her self-esteem. Pt plans on making herself shower, exercising, and fishing as ways to increase her self-esteem.    Brittany Cain 04/15/2014, 11:47 AM

## 2014-04-15 NOTE — Progress Notes (Signed)
Adult Psychoeducational Group Note  Date:  04/15/2014 Time:  9:00 AM  Group Topic/Focus:  Morning Wellness Group  Participation Level:  Active  Participation Quality:  Appropriate and Attentive  Affect:  Appropriate  Cognitive:  Alert and Oriented  Insight: Good  Engagement in Group:  Engaged  Modes of Intervention:  Discussion  Additional Comments: Patient verbalized that her goal is to start exercising today.  Brittany SpryRonecia E Byrd 04/15/2014, 10:59 AM

## 2014-04-15 NOTE — Progress Notes (Signed)
D: Pt is appropriate in affect and anxious in mood. Pt reports her meds as effective. However, this pt is interested in her Celebrex being BID.Pt attended group this evening. Pt observed interacting appropriately within the milieu. Pt is currently denying any suicidal ideation.  A: Writer administered scheduled and prn medications to pt. Continued support and availability as needed was extended to this pt. Staff continue to monitor pt with q1015min checks.  R: No adverse drug reactions noted. Pt receptive to treatment. Pt remains safe at this time.

## 2014-04-15 NOTE — [facility] Group Notes (Signed)
[facility] LCSW Group Therapy  Living A Balanced Life  1:15 - 2: 30          04/15/2014 2:27 PM    Type of Therapy:  Group Therapy  Participation Level:  Did not attend group. `  Felicita Nuncio Hairston 04/15/2014  2:27 PM

## 2014-04-16 DIAGNOSIS — F411 Generalized anxiety disorder: Secondary | ICD-10-CM

## 2014-04-16 DIAGNOSIS — F332 Major depressive disorder, recurrent severe without psychotic features: Principal | ICD-10-CM

## 2014-04-16 MED ORDER — TRAZODONE HCL 50 MG PO TABS
50.0000 mg | ORAL_TABLET | Freq: Once | ORAL | Status: AC
  Administered 2014-04-16: 50 mg via ORAL
  Filled 2014-04-16: qty 1

## 2014-04-16 MED ORDER — GABAPENTIN 100 MG PO CAPS: 100.0000 mg | ORAL_CAPSULE | Freq: Three times a day (TID) | ORAL | Status: DC

## 2014-04-16 MED ORDER — TRAZODONE HCL 100 MG PO TABS
100.0000 mg | ORAL_TABLET | Freq: Every evening | ORAL | Status: DC | PRN
  Filled 2014-04-16: qty 3

## 2014-04-16 MED ORDER — CITALOPRAM HYDROBROMIDE 10 MG PO TABS
30.0000 mg | ORAL_TABLET | Freq: Every day | ORAL | Status: DC
  Filled 2014-04-16: qty 9

## 2014-04-16 MED ORDER — ARIPIPRAZOLE 5 MG PO TABS: 5.0000 mg | ORAL_TABLET | Freq: Every day | ORAL | Status: DC

## 2014-04-16 NOTE — [facility] Group Notes (Signed)
.  [facility]   04/16/2014 1:02 PM    Participation Quality:  Appropraite  Mood/Affect:  Appropriate  Depression Rating:  3  Anxiety Rating:  2  Thoughts of Suicide:  No  Will you contract for safety?   NA  Current AVH:  No  Plan for Discharge/Comments:  Patient attended discharge planning group and actively participated in group.  She reports doing well and being ready to discharge home today.  [facility]  CSW provided all participants with daily workbook.   Transportation Means: Patient has transportation.   Supports:  Patient has a support system.   Brittany Cain, Joesph JulyQuylle Hairston

## 2014-04-16 NOTE — [facility] Suicide Risk Assessment (Signed)
   Demographic Factors:  Adolescent or young adult, Caucasian and Low socioeconomic status  Total Time spent with patient: 30 minutes  Psychiatric Specialty Exam: Physical Exam  ROS  Blood pressure 121/81, pulse 71, temperature 97.3 F (36.3 C), temperature source Oral, resp. rate 20, height 5' 4.25" (1.632 m), weight 102.967 kg (227 lb).Body mass index is 38.66 kg/(m^2).  General Appearance: Casual  Eye Contact::  Good  Speech:  Clear and Coherent  Volume:  Normal  Mood:  Euthymic  Affect:  Appropriate and Congruent  Thought Process:  Coherent and Goal Directed  Orientation:  Full (Time, Place, and Person)  Thought Content:  WDL  Suicidal Thoughts:  No  Homicidal Thoughts:  No  Memory:  Immediate;   Good Recent;   Good  Judgement:  Good  Insight:  Fair  Psychomotor Activity:  Normal  Concentration:  Good  Recall:  Good  Fund of Knowledge:Good  Language: Good  Akathisia:  NA  Handed:  Right  AIMS (if indicated):     Assets:  Communication Skills Desire for Improvement Financial Resources/Insurance Housing Intimacy Leisure Time Physical Health Resilience Social Support Talents/Skills Transportation Vocational/Educational  Sleep:  Number of Hours: 5    Musculoskeletal: Strength & Muscle Tone: within normal limits Gait & Station: normal Patient leans: N/A   Mental Status Per Nursing Assessment::   On Admission:  NA  Current Mental Status by Physician: NA  Loss Factors: NA  Historical Factors: Prior suicide attempts and Family history of mental illness or substance abuse  Risk Reduction Factors:   Sense of responsibility to family, Religious beliefs about death, Employed, Living with another person, especially a relative, Positive social support, Positive therapeutic relationship and Positive coping skills or problem solving skills  Continued Clinical Symptoms:  Depression:   Recent sense of peace/wellbeing Previous Psychiatric Diagnoses and  Treatments Medical Diagnoses and Treatments/Surgeries  Cognitive Features That Contribute To Risk:  Polarized thinking    Suicide Risk:  Minimal: No identifiable suicidal ideation.  Patients presenting with no risk factors but with morbid ruminations; may be classified as minimal risk based on the severity of the depressive symptoms  Discharge Diagnoses:   AXIS I:  Generalized Anxiety Disorder and Major Depression, Recurrent severe AXIS II:  Deferred AXIS III:   Past Medical History  Diagnosis Date  . Hypertension    AXIS IV:  other psychosocial or environmental problems, problems related to social environment and problems with primary support group AXIS V:  61-70 mild symptoms  Plan Of Care/Follow-up recommendations:  Activity:  As tolerated Diet:  Regular  Is patient on multiple antipsychotic therapies at discharge:  No   Has Patient had three or more failed trials of antipsychotic monotherapy by history:  No  Recommended Plan for Multiple Antipsychotic Therapies: NA    Janardhaha R Cleon Signorelli 04/16/2014, 12:11 PM

## 2014-04-16 NOTE — Progress Notes (Signed)
D: Pt has a sad affect that brightens upon approach. Pt reports having an okay day today. She interacts well with the other patients. Pt reports singing two songs during karaoke this evening. She enjoyed the experience. Pt is currently denying any suicidal ideations. Pt has desires for a qhs dose of neurontin to be scheduled.  A: Writer administered scheduled and prn medications to pt. Continued support and availability as needed was extended to this pt. Staff continue to monitor pt with q2715min checks.  R: No adverse drug reactions noted. Pt receptive to treatment. Pt remains safe at this time.

## 2014-04-16 NOTE — Progress Notes (Signed)
Discharge Note: Discharge instructions/prescriptions/medication samples given to patient. Patient verbalized understanding of discharge instructions and prescriptions. Returned belongings to patient. Denies SI/HI/AVH. Patient d/c without incident to the front lobby and transported home by husband.

## 2014-04-16 NOTE — Discharge Summary (Signed)
Physician Discharge Summary Note  Patient:  Brittany Cain is an 53 y.o., female MRN:  [id number] DOB:  [birth year] Patient phone:  [phone] (home)  Patient address:   [address] [place] [address],  Total Time spent with patient: 30 minutes  Date of Admission:  04/08/2014 Date of Discharge: 04/16/2014  Reason for Admission:  Suicidal ideation  Discharge Diagnoses: Active Problems:   MDD (major depressive disorder), recurrent episode, severe   Major depressive disorder, recurrent episode, severe    Psychiatric Specialty Exam:    Please see D/C SRA Physical Exam  ROS  Blood pressure 121/81, pulse 71, temperature 97.3 F (36.3 C), temperature source Oral, resp. rate 20, height 5' 4.25" (1.632 m), weight 102.967 kg (227 lb).Body mass index is 38.66 kg/(m^2).  Musculoskeletal: Strength & Muscle Tone:  Gait & Station:  Patient leans:   Past Psychiatric History:  Diagnosis: MDD, GAD   Hospitalizations: 1992 [facility] (SI attempt, cannot recall means)   Outpatient Care: Denies   Substance Abuse Care: Denies   Self-Mutilation: Hit self in legs with fist   Suicidal Attempts: 1992   Violent Behaviors: Denies, [place] [employer] [place]    DSM5:  Schizophrenia Disorders:   Obsessive-Compulsive Disorders:   Trauma-Stressor Disorders:   Substance/Addictive Disorders:   Depressive Disorders:    Axis Diagnosis:  Discharge Diagnoses:  AXIS I: Generalized Anxiety Disorder and Major Depression, Recurrent severe  AXIS II: Deferred  AXIS III:  Past Medical History   Diagnosis  Date   .  Hypertension     AXIS IV: other psychosocial or environmental problems, problems related to social environment and problems with primary support group  AXIS V: 61-70 mild symptoms     Level of Care:  OP  Hospital Course:  Brittany Cain is a 53 year old female [facility] that [facility] had worsened [facility]. She was sent to the Psych Obs unit for 23 hours and was felt to be unstable and in need of readmission.              Brittany Cain was admitted to the adult unit where she was evaluated and her symptoms were identified. Medication management was discussed and implemented. She was encouraged to participate in unit programming. Medical problems were identified and treated appropriately. Home medication was restarted as needed.  She identified rage as a primary symptom as well as pain from her back surgery.  Medication management was initiated with Abilify 5mg  as adjuvant therapy for her depression and Neurontin for agitation and neuropathic pain.                 Brittany Cain  was evaluated each day by a clinical provider to ascertain the patient's response to treatment.  Improvement was noted by the patient's report of decreasing symptoms, improved sleep and appetite, affect, medication tolerance, behavior, and participation in unit programming.               She was asked each day to complete a self inventory noting mood, mental status, pain, new symptoms, anxiety and concerns.         She responded well to medication and being in a therapeutic and supportive environment. Positive and appropriate behavior was noted and the patient was motivated for recovery.  She worked closely with the treatment team and case manager to develop a discharge plan with appropriate goals. Coping skills, problem solving  as well as relaxation therapies were also part of the unit programming.         By the day of discharge Brittany Cain was in much improved condition than upon admission.  Symptoms were reported as significantly decreased or resolved completely.  The patient denied SI/HI and voiced no AVH. She was motivated to continue taking medication with a goal of continued improvement in mental health.          Brittany Cain was discharged home with a plan to follow up as noted below.    Consults:  None  Significant  Diagnostic Studies:  None  Discharge Vitals:   Blood pressure 121/81, pulse 71, temperature 97.3 F (36.3 C), temperature source Oral, resp. rate 20, height 5' 4.25" (1.632 m), weight 102.967 kg (227 lb). Body mass index is 38.66 kg/(m^2). Lab Results:   No results found for this or any previous visit (from the past 72 hour(s)).  Physical Findings: AIMS: Facial and Oral Movements Muscles of Facial Expression: None, normal Lips and Perioral Area: None, normal Jaw: None, normal Tongue: None, normal,Extremity Movements Upper (arms, wrists, hands, fingers): None, normal Lower (legs, knees, ankles, toes): None, normal, Trunk Movements Neck, shoulders, hips: None, normal, Overall Severity Severity of abnormal movements (highest score from questions above): None, normal Incapacitation due to abnormal movements: None, normal Patient's awareness of abnormal movements (rate only patient's report): No Awareness, Dental Status Current problems with teeth and/or dentures?: No Does patient usually wear dentures?: No  CIWA:  CIWA-Ar Total: 2 COWS:  COWS Total Score: 1  Psychiatric Specialty Exam: See Psychiatric Specialty Exam and Suicide Risk Assessment completed by Attending Physician prior to discharge.  Discharge destination:  Home  Is patient on multiple antipsychotic therapies at discharge:  No   Has Patient had three or more failed trials of antipsychotic monotherapy by history:  No  Recommended Plan for Multiple Antipsychotic Therapies: NA  Discharge Orders   Future Orders Complete By Expires   Diet - low sodium heart healthy  As directed    Discharge instructions  As directed    Increase activity slowly  As directed        Medication List       Indication           ARIPiprazole 5 MG tablet  Commonly known as:  ABILIFY  Take 1 tablet (5 mg total) by mouth daily.   Indication:  Major Depressive Disorder     celecoxib 200 MG capsule  Commonly known as:  CELEBREX  Take  1 capsule (200 mg total) by mouth 2 (two) times daily.   Indication:  Joint Damage causing Pain and Loss of Function     citalopram 10 MG tablet  Commonly known as:  CELEXA  Take 3 tablets (30 mg total) by mouth daily.   Indication:  Depression, Social Anxiety Disorder     cyclobenzaprine 10 MG tablet  Commonly known as:  FLEXERIL  Take 1 tablet by mouth daily as needed.    Muscle relaxer   gabapentin 100 MG capsule  Commonly known as:  NEURONTIN  Take 1 capsule (100 mg total) by mouth 3 (three) times daily.   Indication:  Agitation     HYDROcodone-acetaminophen 5-325 MG per tablet  Commonly known as:  NORCO/VICODIN  Take 1 tablet by mouth 2 (two) times daily as needed for moderate pain.    for moderate to severe pain   lisinopril 20 MG tablet  Commonly known as:  PRINIVIL,ZESTRIL  Take 1  tablet (20 mg total) by mouth every morning.   Indication:  High Blood Pressure     RABEprazole 20 MG tablet  Commonly known as:  ACIPHEX  Take 1 tablet (20 mg total) by mouth every morning.   Indication:  Gastroesophageal Reflux Disease     rOPINIRole 0.25 MG tablet  Commonly known as:  REQUIP  Take 1 tablet (0.25 mg total) by mouth at bedtime.   Indication:  Restless Leg Syndrome     traZODone 100 MG tablet  Commonly known as:  DESYREL  Take 1 tablet (100 mg total) by mouth at bedtime as needed for sleep.   Indication:  Trouble Sleeping           Follow-up Information   Follow up with Dr. Marguerite Olea - [facility] On 04/19/2014. (Monday Apr 19, 2014 at 3:00 PM)    Contact information:   [address] [place], [place]   [address]  [phone]      Follow-up recommendations:   Activities: Resume activity as tolerated. Diet: Heart healthy low sodium diet Tests: Follow up testing will be determined by your out patient provider. Comments:    Total Discharge Time:  Less than 30 minutes.  Signed: Rona Ravens. Mashburn RPAC 11:53 AM 04/16/2014  Patient was seen face to face for the  evaluation, suicide risk assessment and case discussed with treatment team including physician assistant and developed appropriate discharge plan. Reviewed the information documented and agree with the treatment plan.  Randal Buba Avaree Gilberti 04/19/2014 3:41 PM

## 2014-04-16 NOTE — Progress Notes (Signed)
[facility] :  Will you be returning to the same living situation after discharge: Yes,  Patient is returning to her home At discharge, do you have transportation home?:Yes,  Patient to arrange transportation home Do you have the ability to pay for your medications:Yes,  Paitent is able to afford medications.  Release of information consent forms completed and in the chart;  Patient's signature needed at discharge.  Patient to Follow up at: Follow-up Information   Follow up with Dr. Marguerite OleaMoffett - [facility] On 04/19/2014. (Monday Apr 19, 2014 at 3:00 PM)    Contact information:   [address] [place], [place]   [address]  [phone]      Follow up with [facility]   [facility].   Contact information:   [address] [place], [place]   [address]  [phone]      Patient denies SI/HI:  Patient no longer endorsing SI/HI or other thoughts of self harm.  Safety Planning and Suicide Prevention discussed: .Reviewed with all patients during discharge planning group  Jessieca Rhem Hairston Harriet Sutphen 04/16/2014, 1:05 PM

## 2014-04-16 NOTE — Tx Team (Signed)
Interdisciplinary Treatment Plan Update   Date Reviewed:  04/16/2014  Time Reviewed:  10:44 AM  Progress in Treatment:   Attending groups: Yes Participating in groups: Yes Taking medication as prescribed: Yes  Tolerating medication: Yes Family/Significant other contact made:  Yes, collateral contact with husband. Patient understands diagnosis: Yes  Discussing patient identified problems/goals with staff: Yes Medical problems stabilized or resolved: Yes Denies suicidal/homicidal ideation: Yes Patient has not harmed self or others: Yes  For review of initial/current patient goals, please see plan of care.  Estimated Length of Stay:  Discharge today  Reasons for Continued Hospitalization:   New Problems/Goals identified:    Discharge Plan or Barriers:   [facility]  Additional Comments:  Attendees:  Patient:  Brittany PeerDonna Cain 04/16/2014 10:44 AM   Signature:  04/16/2014 10:44 AM  Signature:  Verne SpurrNeil Mashburn, PA 04/16/2014 10:44 AM  Signature:  Skipper ClichePatti Duke, RN 04/16/2014 10:44 AM  Signature:  04/16/2014 10:44 AM  Signature:  Harold Barbanonecia Byrd,  RN 04/16/2014 10:44 AM  Signature:  Juline PatchQuylle Semaje Kinker, LCSW 04/16/2014 10:44 AM  Signature:  Reyes Ivanhelsea Horton, LCSW 04/16/2014 10:44 AM  Signature:  Leisa LenzValerie Enoch, Care Coordinator [facility] 04/16/2014 10:44 AM  Signature:   04/16/2014 10:44 AM  Signature: 04/16/2014  10:44 AM  Signature:   Onnie BoerJennifer Clark, RN [facility] 04/16/2014  10:44 AM  Signature: 04/16/2014  10:44 AM    Scribe for Treatment Team:   Juline PatchQuylle Lydell Moga,  04/16/2014 10:44 AM

## 2014-04-21 NOTE — Progress Notes (Signed)
Patient Discharge Instructions:  After Visit Summary (AVS):   Faxed to:  04/21/14 Discharge Summary Note:   Faxed to:  04/21/14 Psychiatric Admission Assessment Note:   Faxed to:  04/21/14 Suicide Risk Assessment - Discharge Assessment:   Faxed to:  04/21/14 Faxed/Sent to the Next Level Care provider:  04/21/14 Next Level Care Provider Has Access to the EMR, 04/21/14 Faxed to [facility] @ [phone] [facility] via [facility]/Epic access.  Jerelene ReddenSheena E Udell, 04/21/2014, 3:37 PM

## 2014-04-23 ENCOUNTER — Emergency Department: Payer: Self-pay | Admitting: Emergency Medicine

## 2014-04-23 LAB — URINALYSIS, COMPLETE
Bilirubin,UR: NEGATIVE
Glucose,UR: NEGATIVE mg/dL (ref 0–75)
Ketone: NEGATIVE
Nitrite: NEGATIVE
Ph: 6 (ref 4.5–8.0)
Protein: NEGATIVE
Specific Gravity: 1.006 (ref 1.003–1.030)
Squamous Epithelial: 16
WBC UR: 41 /HPF (ref 0–5)

## 2014-04-23 LAB — BASIC METABOLIC PANEL
Anion Gap: 2 — ABNORMAL LOW (ref 7–16)
BUN: 13 mg/dL (ref 7–18)
CALCIUM: 9.2 mg/dL (ref 8.5–10.1)
CHLORIDE: 107 mmol/L (ref 98–107)
CO2: 30 mmol/L (ref 21–32)
Creatinine: 0.99 mg/dL (ref 0.60–1.30)
EGFR (African American): >60
EGFR (Non-African Amer.): >60
Glucose: 90 mg/dL (ref 65–99)
Osmolality: 277 (ref 275–301)
Potassium: 3.9 mmol/L (ref 3.5–5.1)
Sodium: 139 mmol/L (ref 136–145)

## 2014-04-23 LAB — TROPONIN I

## 2014-04-23 LAB — CBC
HCT: 37.2 % (ref 35.0–47.0)
HGB: 12.5 g/dL (ref 12.0–16.0)
MCH: 29.7 pg (ref 26.0–34.0)
MCHC: 33.6 g/dL (ref 32.0–36.0)
MCV: 88 fL (ref 80–100)
Platelet: 294 10*3/uL (ref 150–440)
RBC: 4.21 10*6/uL (ref 3.80–5.20)
RDW: 13.2 % (ref 11.5–14.5)
WBC: 8.6 10*3/uL (ref 3.6–11.0)

## 2015-04-08 NOTE — H&P (Signed)
PATIENT NAME:  Brittany Cain, Brittany Cain MR#:  [id number] DATE OF BIRTH:  01/28/1961  DATE OF ADMISSION:  10/29/2013  ADMITTING PHYSICIAN: Enid Baas, MD  PRIMARY CARE PHYSICIAN: Marisue Ivan, MD  CHIEF COMPLAINT: Chest pressure.  HISTORY OF PRESENT ILLNESS: Ms. Groseclose is a 54 year old Caucasian female with past medical history significant for hypertension and gastroesophageal reflux disease who presented to the hospital for fluoroscopic lithotripsy today. Received morphine injection preprocedure. Then she developed chest pressure in the middle of the chest that was radiating to both right and left sides and then on to the upper abdominal area. The patient denies any allergic reactions to morphine and did receive morphine in the past before. Her pressure remained for a few minutes so she was sent to the ER. The first set of troponin is negative. Her chest pressure is resolved completely at this time. However, she was nauseous here and has received 2 doses of Phenergan and then her blood pressure dropped. So she is being admitted under observation for hypotension at this time. The patient denies any fevers, chills. No shortness of breath on exertion. No chest pain. No cardiac history at baseline.  PAST MEDICAL HISTORY: 1.  Hypertension.  2.  Gastroesophageal reflux disease.  3.  Depression.  4.  Chronic low back pain.   PAST SURGICAL HISTORY: 1.  Hysterectomy.  2.  Cholecystectomy.  3.  Lower back surgery.   ALLERGIES TO MEDICATIONS: SHE GETS HIVES TO CEFTIN.   CURRENT HOME MEDICATIONS: 1.  Lisinopril 5 mg p.o. daily.  2.  AcipHex 20 mg p.o. daily.  3.  Hydrocodone 5/325 mg 1 tablet q. 4 to 6 hours p.r.n.  4.  Paroxetine 20 mg p.o. daily.   SOCIAL HISTORY: Lives at home with her husband. No history of any smoking or alcohol use.   FAMILY HISTORY: Significant for heart disease in sister and also dissection aneurysm.   REVIEW OF SYSTEMS: CONSTITUTIONAL: No fever, fatigue or  weakness.  EYES: Decreased vision in the left eye, however, no inflammation, glaucoma, or cataracts.  ENT: No tinnitus, ear pain, hearing loss, epistaxis, or discharge. RESPIRATORY: No cough, wheeze, hemoptysis, COPD, or dyspnea on exertion. CARDIOVASCULAR: No chest pain, orthopnea, edema, arrhythmia, palpitations, or syncope. GASTROINTESTINAL: No nausea, vomiting, diarrhea, abdominal pain, hematemesis, or melena. GENITOURINARY: Positive for dysuria. No hematuria, renal calculi, frequency, or incontinence.  ENDOCRINE: No polyuria, nocturia, thyroid problems, or heat or cold intolerance. HEMATOLOGY: No anemia, easy bruising or bleeding.  SKIN: No acne, rash, or lesions.  MUSCULOSKELETAL: Positive for chronic lower back pain. No arthritis or gout.  NEUROLOGIC: No numbness, weakness, CVA, TIA, or seizures.  PSYCHOLOGICAL: No anxiety, insomnia, or depression.   PHYSICAL EXAMINATION: VITAL SIGNS: Temperature 97.7 degrees Fahrenheit, pulse 84, respirations 18, blood pressure 119/56 that dropped down to 86/40, and sats 100% on room air.  GENERAL: Well-built, well-nourished female lying in bed, not in any acute distress.  HEENT: Normocephalic, atraumatic. Pupils equal, round, and reacting to light. Anicteric sclerae. Extraocular movements intact. Oropharynx clear without erythema, mass, or exudates.  NECK: Supple. No thyromegaly, JVD, or carotid bruits. No lymphadenopathy.  LUNGS: Moving air bilaterally. No wheeze or crackles. No use of accessory muscles for breathing.  HEART: S1 and S2 regular rate and rhythm. No murmurs, rubs, or gallops.   ABDOMEN: Soft, nontender, and nondistended. No hepatosplenomegaly. Normal bowel sounds.  EXTREMITIES: No pedal edema. No clubbing or cyanosis. 2+ distal pulses palpable bilaterally.  SKIN: No acne, rash, or lesions.  LYMPHATICS: No cervical or inguinal  lymphadenopathy.  NEUROLOGIC: Cranial nerves II through XII remain intact. Motor strength is 5/5 in all 4  extremities. Sensation intact and deep tendon reflexes are 2+ symmetric all extremities.  PSYCH: The patient is awake, alert, and oriented x 3.   LABORATORY AND DIAGNOSTICS: WBC 8.1, hemoglobin 12.4, hematocrit 36.6, platelet count 331.  Sodium 138, potassium 3.7, chloride 106 , bicarbonate 30, BUN 19, creatinine 1, glucose 90, and calcium of 8.7. First set of troponin less than 0.02. CK 67, CK-MB 1.2.   KUB done today showing distal ureteral stone cannot be excluded.   Chest x-ray showing poor lung volumes, bibasilar atelectasis, otherwise normal.  EKG showing normal sinus rhythm, heart rate of 76, and no acute ST-T wave abnormalities.   ASSESSMENT AND PLAN: This is 54 year old female with history of hypertension, depression, and gastroesophageal reflux disease who was scheduled to have lithotripsy. Received preprocedure morphine and had chest pressure. Was sent to the ER and was noted to be hypotensive after Phenergan. 1.  Hypotension, likely from Phenergan and also mild hypovolemia. Continue IV fluids. She is asymptomatic now and blood pressure seems to be improving with fluids. We will admit under observation.  2.  Chest pressure secondary to morphine likely and also gastrointestinal-related symptoms. No cardiac disease. Will get 3 sets of cardiac troponins and monitor on telemetry.  3.  Hypertension. Hold lisinopril as the patient is hypotensive. 4.  Chronic low back pain. Continue her hydrocodone p.r.n.  5.  Gastroesophageal reflux disease. On Protonix.  6.  Depression. Continue her Paxil.  7.  Gastrointestinal and deep venous thrombosis prophylaxis. On Protonix, TEDS and SCDS. The patient is also ambulatory.   CODE STATUS: FULL CODE.  TIME SPENT ADMISSION: 50 minutes. ____________________________ Enid Baasadhika Wanda Rideout, MD rk:sb D: 10/29/2013 13:40:38 ET T: 10/29/2013 14:21:59 ET JOB#: [id number]  cc: Enid Baasadhika Casmir Auguste, MD, <Dictator> Marisue IvanKanhka Linthavong, MD Enid BaasADHIKA Taivon Haroon  MD ELECTRONICALLY SIGNED 11/10/2013 18:10

## 2015-04-08 NOTE — Discharge Summary (Signed)
PATIENT NAME:  Brittany Cain, Brittany Cain MR#:  [id number] DATE OF BIRTH:  [birth year]  DATE OF ADMISSION:  10/29/2013 DATE OF DISCHARGE: 10/30/2013   DISCHARGE DIAGNOSES:  1.  Hypertension, resolved.  2.  Chest discomfort, noncardiac, resolved.  3.  Nephrolithiasis. 4.  History of depression.  5.  Gastroesophageal reflux disease.  6.  History of hypertension.  7.  History of chronic back pain.   DISCHARGE MEDICATIONS:  1.  Paroxetine 20 mg 1-1/2 tabs p.o. daily. 2.  AcipHex 20 mg p.o. daily.  3.  Lisinopril 5 mg p.o. daily.  4.  Hydrocodone as needed per home regimen.   CONSULTS: None.   PROCEDURES: None.   PERTINENT LABS AND STUDIES: EKG within normal limits. Cardiac enzymes were negative x 3. C-MET and CBC all within normal limits. Chest x-ray showed no acute abnormalities.   BRIEF HOSPITAL COURSE:  1.  Chest discomfort. She initially came in complaining of chest discomfort that has spontaneously resolved. It initially occurred during her lithotripsy procedure, which she did not complete after she received some morphine. Her workup was completely negative as stated above. No further workup at this time.  2.  Hypertension. The patient initially was found to be hypotensive in the ER. After receiving some Phenergan,  this morning her blood pressure is back to the normal range. She does have history of hypertension on lisinopril and we will resume her home regimen at the lower dose. She is to monitor her blood pressure and call back if she has recurrent symptoms.  3.  Nephrolithiasis. This is essentially unchanged. She will follow with urology as an outpatient for further evaluation and treatment.  4.  Other chronic medical issues remained stable at this time. No changes to her regimen.   DISPOSITION: She is in stable condition for discharge to home.   FOLLOWUP: Follow up with Dr. Netty Starring if needed if she were to become symptomatic again. Otherwise, she is to follow urology for her  nephrolithiasis.  ____________________________ Dion Body, MD kl:aw D: 10/30/2013 08:23:39 ET T: 10/30/2013 09:50:35 ET JOB#: [id number]  cc: Dion Body, MD, <Dictator> Dion Body MD ELECTRONICALLY SIGNED 11/23/2013 9:56

## 2015-05-12 ENCOUNTER — Emergency Department
Admission: EM | Admit: 2015-05-12 | Discharge: 2015-05-12 | Disposition: A | Discharge status: Home / Self Care | Payer: BLUE CROSS/BLUE SHIELD | Attending: Emergency Medicine | Admitting: Emergency Medicine

## 2015-05-12 DIAGNOSIS — Z90710 Acquired absence of both cervix and uterus: Secondary | ICD-10-CM | POA: Insufficient documentation

## 2015-05-12 DIAGNOSIS — R1030 Lower abdominal pain, unspecified: Secondary | ICD-10-CM | POA: Diagnosis present

## 2015-05-12 DIAGNOSIS — N390 Urinary tract infection, site not specified: Secondary | ICD-10-CM | POA: Diagnosis not present

## 2015-05-12 DIAGNOSIS — Z79899 Other long term (current) drug therapy: Secondary | ICD-10-CM | POA: Diagnosis not present

## 2015-05-12 DIAGNOSIS — Z9089 Acquired absence of other organs: Secondary | ICD-10-CM | POA: Insufficient documentation

## 2015-05-12 DIAGNOSIS — I10 Essential (primary) hypertension: Secondary | ICD-10-CM | POA: Diagnosis not present

## 2015-05-12 LAB — URINALYSIS COMPLETE WITH MICROSCOPIC (ARMC ONLY)
BACTERIA UA: NONE SEEN
BILIRUBIN URINE: NEGATIVE
Glucose, UA: NEGATIVE mg/dL
KETONES UR: NEGATIVE mg/dL
Nitrite: NEGATIVE
PH: 5 (ref 5.0–8.0)
Protein, ur: 100 mg/dL — AB
Specific Gravity, Urine: 1.019 (ref 1.005–1.030)

## 2015-05-12 MED ORDER — LEVOFLOXACIN 750 MG PO TABS: 750.0000 mg | ORAL_TABLET | Freq: Every day | ORAL | Status: AC

## 2015-05-12 MED ORDER — LEVOFLOXACIN 250 MG PO TABS
ORAL_TABLET | ORAL | Status: AC
  Administered 2015-05-12
  Filled 2015-05-12: qty 1

## 2015-05-12 MED ORDER — LEVOFLOXACIN 500 MG PO TABS
ORAL_TABLET | ORAL | Status: AC
  Administered 2015-05-12
  Filled 2015-05-12: qty 1

## 2015-05-12 MED ORDER — LEVOFLOXACIN 500 MG PO TABS
750.0000 mg | ORAL_TABLET | Freq: Once | ORAL | Status: AC
  Administered 2015-05-12: 750 mg via ORAL

## 2015-05-12 NOTE — ED Provider Notes (Signed)
[facility] Emergency Department Provider Note  ____________________________________________  Time seen: Approximately 11:39 PM  I have reviewed the triage vital signs and the nursing notes.   HISTORY  Chief Complaint Abdominal Pain    HPI Brittany Cain is a 54 y.o. female with history of recurrent UTIs who presents to the emergency room with 2 days of lower abdominal discomfort and dysuria. She was seen on May 12 at medical clinic for dysuria and treated with Macrobid. Her symptoms improved but she has a return of discomfort. She has felt some low back pain, but no fevers or chills. She has been able to drink plenty of fluids today. She has mild back discomfort. She has a history of kidney stones and has seen Dr. Sheppard PentonWolf in the past for lithotripsy. This was approximately 2 years ago. Her primary physician is Dr. Burnadette PopLinthavong.   Past Medical History  Diagnosis Date  . Hypertension     Patient Active Problem List   Diagnosis Date Noted  . Major depressive disorder, recurrent episode, severe 04/09/2014  . MDD (major depressive disorder), recurrent episode, severe 04/08/2014  . GAD (generalized anxiety disorder) 03/25/2014    Past Surgical History  Procedure Laterality Date  . Abdominal hysterectomy    . Cholecystectomy    . Back surgery      Current Outpatient Rx  Name  Route  Sig  Dispense  Refill  . ARIPiprazole (ABILIFY) 5 MG tablet   Oral   Take 1 tablet (5 mg total) by mouth daily.   30 tablet   0   . celecoxib (CELEBREX) 200 MG capsule   Oral   Take 1 capsule (200 mg total) by mouth 2 (two) times daily.   60 capsule   0   . citalopram (CELEXA) 10 MG tablet   Oral   Take 3 tablets (30 mg total) by mouth daily.   90 tablet   0   . cyclobenzaprine (FLEXERIL) 10 MG tablet   Oral   Take 1 tablet by mouth daily as needed.         . gabapentin (NEURONTIN) 100 MG capsule   Oral   Take 1 capsule (100 mg total) by mouth 3 (three)  times daily.   30 capsule   0   . HYDROcodone-acetaminophen (NORCO/VICODIN) 5-325 MG per tablet   Oral   Take 1 tablet by mouth 2 (two) times daily as needed for moderate pain.          Marland Kitchen. levofloxacin (LEVAQUIN) 750 MG tablet   Oral   Take 1 tablet (750 mg total) by mouth daily.   7 tablet   0   . lisinopril (PRINIVIL,ZESTRIL) 20 MG tablet   Oral   Take 1 tablet (20 mg total) by mouth every morning.         . RABEprazole (ACIPHEX) 20 MG tablet   Oral   Take 1 tablet (20 mg total) by mouth every morning.         Marland Kitchen. rOPINIRole (REQUIP) 0.25 MG tablet   Oral   Take 1 tablet (0.25 mg total) by mouth at bedtime.   30 tablet   0   . traZODone (DESYREL) 100 MG tablet   Oral   Take 1 tablet (100 mg total) by mouth at bedtime as needed for sleep.   30 tablet   0     Allergies Ceftin  No family history on file.  Social History History  Substance Use Topics  . Smoking  status: Never Smoker   . Smokeless tobacco: Never Used  . Alcohol Use: No    Review of Systems Constitutional: No fever/chills Eyes: No visual changes. ENT: No sore throat. Cardiovascular: has felt some mild chest tightness, she thinks is related to stress, waiting in the emergency room. Respiratory: Denies shortness of breath. She has had a cough. And was recently taken off lisinopril. Her coughing is better. Gastrointestinal: mild lower abdominal pain.  No nausea, no vomiting.  No diarrhea.  Musculoskeletal: Negative for back pain. Skin: Negative for rash. Neurological: Negative for headaches, focal weakness or numbness.  10-point ROS otherwise negative.  ____________________________________________   PHYSICAL EXAM:  VITAL SIGNS: ED Triage Vitals  Enc Vitals Group     BP 05/12/15 1941 153/94 mmHg     Pulse Rate 05/12/15 1941 100     Resp 05/12/15 1941 18     Temp 05/12/15 1941 98.1 F (36.7 C)     Temp Source 05/12/15 1941 Oral     SpO2 05/12/15 1941 97 %     Weight 05/12/15 1941  234 lb (106.142 kg)     Height 05/12/15 1941  (1.6 m)     Head Cir --      Peak Flow --      Pain Score 05/12/15 2316 7     Pain Loc --      Pain Edu? --      Excl. in GC? --     Constitutional: Alert and oriented. Well appearing and in no acute distress. Eyes: Conjunctivae are normal. PERRL. EOMI. Head: Atraumatic. Nose: No congestion/rhinnorhea. Mouth/Throat: Mucous membranes are moist.  Oropharynx non-erythematous. Neck: supple Cardiovascular: Normal rate, regular rhythm. Grossly normal heart sounds.  Good peripheral circulation. Respiratory: Normal respiratory effort.  No retractions. Lungs CTAB. Gastrointestinal: Soft and nontender. No distention. No abdominal bruits.  Neurologic:  Normal speech and language. No gross focal neurologic deficits are appreciated. Speech is normal. No gait instability. Skin:  Skin is warm, dry and intact. No rash noted. Psychiatric: Mood and affect are normal. Speech and behavior are normal.  ____________________________________________   LABS (all labs ordered are listed, but only abnormal results are displayed)  Labs Reviewed  URINALYSIS COMPLETEWITH MICROSCOPIC ([facility] ONLY) - Abnormal; Notable for the following:    Color, Urine YELLOW (*)    APPearance TURBID (*)    Hgb urine dipstick 1+ (*)    Protein, ur 100 (*)    Leukocytes, UA 3+ (*)    Squamous Epithelial / LPF 6-30 (*)    All other components within normal limits   ____________________________________________  EKG   ____________________________________________  RADIOLOGY   ____________________________________________   PROCEDURES  Procedure(s) performed: None  Critical Care performed: No  ____________________________________________   INITIAL IMPRESSION / ASSESSMENT AND PLAN / ED COURSE  Pertinent labs & imaging results that were available during my care of the patient were reviewed by me and considered in my medical decision making (see chart for  details).  Urinary tract infection, recurrent. ____________________________________________   FINAL CLINICAL IMPRESSION(S) / ED DIAGNOSES  Final diagnoses:  UTI (lower urinary tract infection)   54 year old female with recurrent urinary tract infection. Seen on May 12 and treated with Macrobid. Symptoms improved but have returned. She has been seen by urology approximately 2 years ago for kidney stones. She is encouraged to return for a urology evaluation. She is given Levaquin 7501. She will continue Levaquin for 1 week. She can return to the clinic tomorrow if she develops  fevers chills nausea or vomiting.   Ignacia Bayley, PA-C 05/12/15 [id number]  Myrna Blazer, MD 05/14/15 [phone]

## 2015-05-12 NOTE — Discharge Instructions (Signed)
Urinary Tract Infection Urinary tract infections (UTIs) can develop anywhere along your urinary tract. Your urinary tract is your body's drainage system for removing wastes and extra water. Your urinary tract includes two kidneys, two ureters, a bladder, and a urethra. Your kidneys are a pair of bean-shaped organs. Each kidney is about the size of your fist. They are located below your ribs, one on each side of your spine. CAUSES Infections are caused by microbes, which are microscopic organisms, including fungi, viruses, and bacteria. These organisms are so small that they can only be seen through a microscope. Bacteria are the microbes that most commonly cause UTIs. SYMPTOMS  Symptoms of UTIs may vary by age and gender of the patient and by the location of the infection. Symptoms in young women typically include a frequent and intense urge to urinate and a painful, burning feeling in the bladder or urethra during urination. Older women and men are more likely to be tired, shaky, and weak and have muscle aches and abdominal pain. A fever may mean the infection is in your kidneys. Other symptoms of a kidney infection include pain in your back or sides below the ribs, nausea, and vomiting. DIAGNOSIS To diagnose a UTI, your caregiver will ask you about your symptoms. Your caregiver also will ask to provide a urine sample. The urine sample will be tested for bacteria and white blood cells. White blood cells are made by your body to help fight infection. TREATMENT  Typically, UTIs can be treated with medication. Because most UTIs are caused by a bacterial infection, they usually can be treated with the use of antibiotics. The choice of antibiotic and length of treatment depend on your symptoms and the type of bacteria causing your infection. HOME CARE INSTRUCTIONS  If you were prescribed antibiotics, take them exactly as your caregiver instructs you. Finish the medication even if you feel better after you  have only taken some of the medication.  Drink enough water and fluids to keep your urine clear or pale yellow.  Avoid caffeine, tea, and carbonated beverages. They tend to irritate your bladder.  Empty your bladder often. Avoid holding urine for long periods of time.  Empty your bladder before and after sexual intercourse.  After a bowel movement, women should cleanse from front to back. Use each tissue only once. SEEK MEDICAL CARE IF:   You have back pain.  You develop a fever.  Your symptoms do not begin to resolve within 3 days. SEEK IMMEDIATE MEDICAL CARE IF:   You have severe back pain or lower abdominal pain.  You develop chills.  You have nausea or vomiting.  You have continued burning or discomfort with urination. MAKE SURE YOU:   Understand these instructions.  Will watch your condition.  Will get help right away if you are not doing well or get worse. Document Released: 09/12/2005 Document Revised: 06/03/2012 Document Reviewed: 01/11/2012 [facility] Patient Information 2015 [employer]. This information is not intended to replace advice given to you by your health care provider. Make sure you discuss any questions you have with your health care provider.   Take antibiotic as directed. Contact the urologist for a follow-up appointment. Return to the clinic tomorrow if having any worsening symptoms.

## 2015-07-21 ENCOUNTER — Encounter: Payer: Self-pay | Admitting: Emergency Medicine

## 2015-07-21 ENCOUNTER — Emergency Department
Admission: EM | Admit: 2015-07-21 | Discharge: 2015-07-21 | Disposition: A | Discharge status: Home / Self Care | Payer: BLUE CROSS/BLUE SHIELD | Attending: Emergency Medicine | Admitting: Emergency Medicine

## 2015-07-21 ENCOUNTER — Emergency Department: Payer: BLUE CROSS/BLUE SHIELD

## 2015-07-21 ENCOUNTER — Other Ambulatory Visit: Payer: Self-pay

## 2015-07-21 DIAGNOSIS — R079 Chest pain, unspecified: Secondary | ICD-10-CM | POA: Diagnosis not present

## 2015-07-21 DIAGNOSIS — I10 Essential (primary) hypertension: Secondary | ICD-10-CM | POA: Insufficient documentation

## 2015-07-21 DIAGNOSIS — Z79899 Other long term (current) drug therapy: Secondary | ICD-10-CM | POA: Insufficient documentation

## 2015-07-21 DIAGNOSIS — Z79811 Long term (current) use of aromatase inhibitors: Secondary | ICD-10-CM | POA: Diagnosis not present

## 2015-07-21 DIAGNOSIS — R0789 Other chest pain: Secondary | ICD-10-CM

## 2015-07-21 DIAGNOSIS — F419 Anxiety disorder, unspecified: Secondary | ICD-10-CM | POA: Insufficient documentation

## 2015-07-21 DIAGNOSIS — Z791 Long term (current) use of non-steroidal anti-inflammatories (NSAID): Secondary | ICD-10-CM | POA: Insufficient documentation

## 2015-07-21 DIAGNOSIS — R0602 Shortness of breath: Secondary | ICD-10-CM

## 2015-07-21 LAB — COMPREHENSIVE METABOLIC PANEL
ALT: 36 U/L (ref 14–54)
AST: 32 U/L (ref 15–41)
Albumin: 3.7 g/dL (ref 3.5–5.0)
Alkaline Phosphatase: 98 U/L (ref 38–126)
Anion gap: 7 (ref 5–15)
BUN: 13 mg/dL (ref 6–20)
CALCIUM: 9 mg/dL (ref 8.9–10.3)
CO2: 29 mmol/L (ref 22–32)
Chloride: 104 mmol/L (ref 101–111)
Creatinine, Ser: 0.87 mg/dL (ref 0.44–1.00)
GFR calc Af Amer: >60 mL/min (ref >60)
Glucose, Bld: 98 mg/dL (ref 65–99)
POTASSIUM: 3.6 mmol/L (ref 3.5–5.1)
SODIUM: 140 mmol/L (ref 135–145)
Total Bilirubin: 0.7 mg/dL (ref 0.3–1.2)
Total Protein: 6.8 g/dL (ref 6.5–8.1)

## 2015-07-21 LAB — CBC WITH DIFFERENTIAL/PLATELET
Basophils Absolute: 0.1 10*3/uL (ref 0–0.1)
Basophils Relative: 1 %
EOS PCT: 4 %
Eosinophils Absolute: 0.4 10*3/uL (ref 0–0.7)
HCT: 41.8 % (ref 35.0–47.0)
HEMOGLOBIN: 13.9 g/dL (ref 12.0–16.0)
LYMPHS ABS: 3.1 10*3/uL (ref 1.0–3.6)
LYMPHS PCT: 30 %
MCH: 29.3 pg (ref 26.0–34.0)
MCHC: 33.3 g/dL (ref 32.0–36.0)
MCV: 88 fL (ref 80.0–100.0)
MONO ABS: 0.6 10*3/uL (ref 0.2–0.9)
Monocytes Relative: 6 %
Neutro Abs: 6 10*3/uL (ref 1.4–6.5)
Neutrophils Relative %: 59 %
Platelets: 325 10*3/uL (ref 150–440)
RBC: 4.75 MIL/uL (ref 3.80–5.20)
RDW: 12.9 % (ref 11.5–14.5)
WBC: 10.3 10*3/uL (ref 3.6–11.0)

## 2015-07-21 LAB — FIBRIN DERIVATIVES D-DIMER (ARMC ONLY): Fibrin derivatives D-dimer (ARMC): 466 (ref 0–499)

## 2015-07-21 LAB — TROPONIN I: Troponin I: <0.03 ng/mL (ref <0.031)

## 2015-07-21 MED ORDER — IOHEXOL 350 MG/ML SOLN
100.0000 mL | Freq: Once | INTRAVENOUS | Status: AC | PRN
  Administered 2015-07-21: 100 mL via INTRAVENOUS

## 2015-07-21 NOTE — ED Provider Notes (Signed)
[facility] Emergency Department Provider Note  ____________________________________________  Time seen: 0 845  I have reviewed the triage vital signs and the nursing notes.   HISTORY  Chief Complaint Chest Pain   } HPI Brittany Cain is a 54 y.o. female describes chest pain and some mild shortness of breath now as been occurring since Thursday of last week. Patient states her chest discomfort is somewhat constipated and had some occasional radiation into her left arm. She describes on Friday she felt a ""tearing"" sensation in her chest. She denies any radiation of pain to the back or flank areas. Patient denies any focal weakness in either upper or lower extremities. She denies any nausea, vomiting, shortness of breath. She denies any pulmonary emboli risk factors such as recent travel etc. She is mainly concerned because of her family history of heart disease along with her sister have a history of thoracic dissection.    Past Medical History  Diagnosis Date  . Hypertension     Patient Active Problem List   Diagnosis Date Noted  . Major depressive disorder, recurrent episode, severe 04/09/2014  . MDD (major depressive disorder), recurrent episode, severe 04/08/2014  . GAD (generalized anxiety disorder) 03/25/2014    Past Surgical History  Procedure Laterality Date  . Abdominal hysterectomy    . Cholecystectomy    . Back surgery      Current Outpatient Rx  Name  Route  Sig  Dispense  Refill  . buPROPion (WELLBUTRIN SR) 150 MG 12 hr tablet   Oral   Take 150 mg by mouth 2 (two) times daily.         . eszopiclone (LUNESTA) 1 MG TABS tablet   Oral   Take 3 mg by mouth at bedtime. Take immediately before bedtime         . gabapentin (NEURONTIN) 100 MG capsule   Oral   Take 1 capsule (100 mg total) by mouth 3 (three) times daily.   30 capsule   0   . losartan (COZAAR) 100 MG tablet   Oral   Take 100 mg by mouth daily.         .  RABEprazole (ACIPHEX) 20 MG tablet   Oral   Take 1 tablet (20 mg total) by mouth every morning.         Marland Kitchen rOPINIRole (REQUIP) 0.25 MG tablet   Oral   Take 1 tablet (0.25 mg total) by mouth at bedtime.   30 tablet   0   . ARIPiprazole (ABILIFY) 5 MG tablet   Oral   Take 1 tablet (5 mg total) by mouth daily.   30 tablet   0   . celecoxib (CELEBREX) 200 MG capsule   Oral   Take 1 capsule (200 mg total) by mouth 2 (two) times daily.   60 capsule   0   . citalopram (CELEXA) 10 MG tablet   Oral   Take 3 tablets (30 mg total) by mouth daily.   90 tablet   0   . lisinopril (PRINIVIL,ZESTRIL) 20 MG tablet   Oral   Take 1 tablet (20 mg total) by mouth every morning.         . traZODone (DESYREL) 100 MG tablet   Oral   Take 1 tablet (100 mg total) by mouth at bedtime as needed for sleep.   30 tablet   0     Allergies Ceftin and Vilazodone hcl  History reviewed. No pertinent family history.  Social History History  Substance Use Topics  . Smoking status: Never Smoker   . Smokeless tobacco: Never Used  . Alcohol Use: No    Review of Systems  Constitutional: Negative for fever. Eyes: Negative for visual changes. ENT: Negative for sore throat Cardiovascular: Negative for chest pain. Respiratory: Negative for shortness of breath. Gastrointestinal: Negative for abdominal pain, vomiting and diarrhea. Genitourinary: Negative for dysuria. Musculoskeletal: Negative for back pain. Skin: Negative for rash. Neurological: Negative for headaches or focal weakness Psychiatric: Patient's mildly anxious and her sister is with her says she's been under a lot of stress    ____________________________________________   PHYSICAL EXAM:  VITAL SIGNS: ED Triage Vitals  Enc Vitals Group     BP 07/21/15 0816 134/59 mmHg     Pulse Rate 07/21/15 0816 60     Resp 07/21/15 0816 20     Temp 07/21/15 0816 98.6 F (37 C)     Temp Source 07/21/15 0816 Oral     SpO2 07/21/15  0816 100 %     Weight 07/21/15 0820 220 lb (99.791 kg)     Height 07/21/15 0816 5\' 3"  (1.6 m)     Head Cir --      Peak Flow --      Pain Score 07/21/15 0820 3     Pain Loc --      Pain Edu? --      Excl. in GC? --      Constitutional: Alert and oriented. Well appearing and in no distress. Eyes: Conjunctivae are normal.  ENT   Head: Normocephalic and atraumatic.   Mouth/Throat: Mucous membranes are moist. Cardiovascular: Normal rate, regular rhythm. Normal and symmetric distal pulses are present in all extremities. No murmurs, rubs, or gallops. Respiratory: Normal respiratory effort without tachypnea nor retractions. Breath sounds are clear and equal bilaterally.  Gastrointestinal: Soft and non-tender in all quadrants. No distention. There is no CVA tenderness. Genitourinary: deferred Musculoskeletal: Nontender with normal range of motion in all extremities. No lower extremity tenderness nor edema. Neurologic:  Normal speech and language. No gross focal neurologic deficits are appreciated. Skin:  Skin is warm, dry and intact. No rash noted. Psychiatric: Mood and affect are normal. Patient exhibits appropriate insight and judgment.  ____________________________________________    LABS (pertinent positives/negatives)  Labs Reviewed  CBC WITH DIFFERENTIAL/PLATELET  COMPREHENSIVE METABOLIC PANEL  FIBRIN DERIVATIVES D-DIMER ([facility] ONLY)  TROPONIN I   Laboratory work was reviewed and showed no significant abnormalities ____________________________________________   EKG  ED ECG REPORT I, Jennye Moccasin, the attending physician, personally viewed and interpreted this ECG.   Date: 07/21/2015  EKG Time: 813  Rate: 74  Rhythm: Normal sinus rhythm  Axis: Normal  Intervals: PR interval is 172 ms, QRS duration 84 ms  ST&T Change: No significant ST-T changes, moderate voltage criteria for left ventricular hypertrophy   ____________________________________________     RADIOLOGY I have personally reviewed any xrays that were ordered on this patient: CT was reviewed by the radiologist and was negative for pulmonary embolism or aortic dissection  ____________________________________________   PROCEDURES  Procedure(s) performed: none  Critical Care performed: None  ____________________________________________   INITIAL IMPRESSION / ASSESSMENT AND PLAN / ED COURSE  Pertinent labs & imaging results that were available during my care of the patient were reviewed by me and considered in my medical decision making (see chart for details).  Differential diagnosis includes mainly aortic dissection, acute coronary syndrome, pulmonary embolism, etc. The patient's family history is  concerning due to her sister having aortic dissections. The patient having discomfort now for several days with a negative troponin I felt was unlikely to be cardiovascular in nature. Her blood pressure here is been within normal limits and I didn't see a reason to prescribe her any blood pressure medications at this time. Given her CT results and her history and physical exam I felt this was unlikely to be a life-threatening cause for chest pain. We talked about other differentials which could be esophageal reflux disease, anxiety, musculoskeletal, etc. Appears to be of understanding the discharge follow up with her primary physician. She's been otherwise continue all of her current medications. Skin advised to switch from ibuprofen to Tylenol as this may be causing some reflux-type symptoms. All questions and concerns were addressed at the bedside  Acute unspecified chest pain  ____________________________________________   FINAL CLINICAL IMPRESSION(S) / ED DIAGNOSES  Final diagnoses:  Chest pain of unknown etiology     Jennye Moccasin, MD 07/21/15 [id number]

## 2015-07-21 NOTE — Discharge Instructions (Signed)
Chest Pain (Nonspecific) °It is often hard to give a specific diagnosis for the cause of chest pain. There is always a chance that your pain could be related to something serious, such as a heart attack or a blood clot in the lungs. You need to follow up with your health care provider for further evaluation. °CAUSES  °· Heartburn. °· Pneumonia or bronchitis. °· Anxiety or stress. °· Inflammation around your heart (pericarditis) or lung (pleuritis or pleurisy). °· A blood clot in the lung. °· A collapsed lung (pneumothorax). It can develop suddenly on its own (spontaneous pneumothorax) or from trauma to the chest. °· Shingles infection (herpes zoster virus). °The chest wall is composed of bones, muscles, and cartilage. Any of these can be the source of the pain. °· The bones can be bruised by injury. °· The muscles or cartilage can be strained by coughing or overwork. °· The cartilage can be affected by inflammation and become sore (costochondritis). °DIAGNOSIS  °Lab tests or other studies may be needed to find the cause of your pain. Your health care provider may have you take a test called an ambulatory electrocardiogram (ECG). An ECG records your heartbeat patterns over a 24-hour period. You may also have other tests, such as: °· Transthoracic echocardiogram (TTE). During echocardiography, sound waves are used to evaluate how blood flows through your heart. °· Transesophageal echocardiogram (TEE). °· Cardiac monitoring. This allows your health care provider to monitor your heart rate and rhythm in real time. °· Holter monitor. This is a portable device that records your heartbeat and can help diagnose heart arrhythmias. It allows your health care provider to track your heart activity for several days, if needed. °· Stress tests by exercise or by giving medicine that makes the heart beat faster. °TREATMENT  °· Treatment depends on what may be causing your chest pain. Treatment may include: °¨ Acid blockers for  heartburn. °¨ Anti-inflammatory medicine. °¨ Pain medicine for inflammatory conditions. °¨ Antibiotics if an infection is present. °· You may be advised to change lifestyle habits. This includes stopping smoking and avoiding alcohol, caffeine, and chocolate. °· You may be advised to keep your head raised (elevated) when sleeping. This reduces the chance of acid going backward from your stomach into your esophagus. °Most of the time, nonspecific chest pain will improve within 2-3 days with rest and mild pain medicine.  °HOME CARE INSTRUCTIONS  °· If antibiotics were prescribed, take them as directed. Finish them even if you start to feel better. °· For the next few days, avoid physical activities that bring on chest pain. Continue physical activities as directed. °· Do not use any tobacco products, including cigarettes, chewing tobacco, or electronic cigarettes. °· Avoid drinking alcohol. °· Only take medicine as directed by your health care provider. °· Follow your health care provider's suggestions for further testing if your chest pain does not go away. °· Keep any follow-up appointments you made. If you do not go to an appointment, you could develop lasting (chronic) problems with pain. If there is any problem keeping an appointment, call to reschedule. °SEEK MEDICAL CARE IF:  °· Your chest pain does not go away, even after treatment. °· You have a rash with blisters on your chest. °· You have a fever. °SEEK IMMEDIATE MEDICAL CARE IF:  °· You have increased chest pain or pain that spreads to your arm, neck, jaw, back, or abdomen. °· You have shortness of breath. °· You have an increasing cough, or you cough   up blood.  You have severe back or abdominal pain.  You feel nauseous or vomit.  You have severe weakness.  You faint.  You have chills. This is an emergency. Do not wait to see if the pain will go away. Get medical help at once. Call your local emergency services (911 in [place] Do not drive  yourself to the hospital. MAKE SURE YOU:   Understand these instructions.  Will watch your condition.  Will get help right away if you are not doing well or get worse. Document Released: 09/12/2005 Document Revised: 12/08/2013 Document Reviewed: 07/08/2008 [facility] Patient Information 2015 [place], [place]. This information is not intended to replace advice given to you by your health care provider. Make sure you discuss any questions you have with your health care provider. Please return if condition worsens. Change from ibuprofen to Tylenol for pain. Return for any new concerns such as nausea, vomiting, fever, etc.

## 2015-07-21 NOTE — ED Notes (Signed)
MD at bedside. 

## 2015-07-21 NOTE — ED Notes (Signed)
Patient presents to the ED with chest pain and shortness of breath.  Patient reports pressure in chest and pain that radiates into neck and down her left arm.  Patient states on Friday she felt a "tearing" sensation in her chest and since then she has felt intermittent pressure that has increased the past two days.

## 2016-06-11 ENCOUNTER — Encounter: Payer: Self-pay | Admitting: Emergency Medicine

## 2016-06-11 ENCOUNTER — Emergency Department
Admission: EM | Admit: 2016-06-11 | Discharge: 2016-06-11 | Disposition: A | Discharge status: Home / Self Care | Payer: BLUE CROSS/BLUE SHIELD | Attending: Emergency Medicine | Admitting: Emergency Medicine

## 2016-06-11 DIAGNOSIS — N390 Urinary tract infection, site not specified: Secondary | ICD-10-CM | POA: Diagnosis not present

## 2016-06-11 DIAGNOSIS — I10 Essential (primary) hypertension: Secondary | ICD-10-CM | POA: Diagnosis not present

## 2016-06-11 DIAGNOSIS — F332 Major depressive disorder, recurrent severe without psychotic features: Secondary | ICD-10-CM | POA: Insufficient documentation

## 2016-06-11 DIAGNOSIS — R42 Dizziness and giddiness: Secondary | ICD-10-CM | POA: Diagnosis present

## 2016-06-11 DIAGNOSIS — Z79899 Other long term (current) drug therapy: Secondary | ICD-10-CM | POA: Insufficient documentation

## 2016-06-11 DIAGNOSIS — Z8719 Personal history of other diseases of the digestive system: Secondary | ICD-10-CM | POA: Insufficient documentation

## 2016-06-11 HISTORY — DX: Depression, unspecified: F32.A

## 2016-06-11 HISTORY — DX: Gastro-esophageal reflux disease without esophagitis: K21.9

## 2016-06-11 HISTORY — DX: Major depressive disorder, single episode, unspecified: F32.9

## 2016-06-11 HISTORY — DX: Other chronic pain: G89.29

## 2016-06-11 HISTORY — DX: Anxiety disorder, unspecified: F41.9

## 2016-06-11 HISTORY — DX: Dorsalgia, unspecified: M54.9

## 2016-06-11 HISTORY — DX: Restless legs syndrome: G25.81

## 2016-06-11 LAB — URINALYSIS COMPLETE WITH MICROSCOPIC (ARMC ONLY)
BACTERIA UA: NONE SEEN
Bilirubin Urine: NEGATIVE
Glucose, UA: NEGATIVE mg/dL
Hgb urine dipstick: NEGATIVE
Ketones, ur: NEGATIVE mg/dL
Nitrite: NEGATIVE
PROTEIN: NEGATIVE mg/dL
RBC / HPF: NONE SEEN RBC/hpf (ref 0–5)
SPECIFIC GRAVITY, URINE: 1.013 (ref 1.005–1.030)
Squamous Epithelial / LPF: NONE SEEN
WBC UA: NONE SEEN WBC/hpf (ref 0–5)
pH: 6 (ref 5.0–8.0)

## 2016-06-11 LAB — CBC
HEMATOCRIT: 37.2 % (ref 35.0–47.0)
HEMOGLOBIN: 13.1 g/dL (ref 12.0–16.0)
MCH: 30.6 pg (ref 26.0–34.0)
MCHC: 35.3 g/dL (ref 32.0–36.0)
MCV: 86.8 fL (ref 80.0–100.0)
Platelets: 292 10*3/uL (ref 150–440)
RBC: 4.29 MIL/uL (ref 3.80–5.20)
RDW: 12.8 % (ref 11.5–14.5)
WBC: 7.3 10*3/uL (ref 3.6–11.0)

## 2016-06-11 LAB — BASIC METABOLIC PANEL
Anion gap: 6 (ref 5–15)
BUN: 17 mg/dL (ref 6–20)
CHLORIDE: 104 mmol/L (ref 101–111)
CO2: 27 mmol/L (ref 22–32)
Calcium: 9 mg/dL (ref 8.9–10.3)
Creatinine, Ser: 0.92 mg/dL (ref 0.44–1.00)
GFR calc non Af Amer: >60 mL/min (ref >60)
GLUCOSE: 98 mg/dL (ref 65–99)
POTASSIUM: 3.6 mmol/L (ref 3.5–5.1)
Sodium: 137 mmol/L (ref 135–145)

## 2016-06-11 MED ORDER — CIPROFLOXACIN HCL 500 MG PO TABS
500.0000 mg | ORAL_TABLET | Freq: Once | ORAL | Status: AC
  Administered 2016-06-11: 500 mg via ORAL
  Filled 2016-06-11: qty 1

## 2016-06-11 MED ORDER — CIPROFLOXACIN HCL 500 MG PO TABS: 500.0000 mg | ORAL_TABLET | Freq: Two times a day (BID) | ORAL | Status: AC

## 2016-06-11 NOTE — ED Provider Notes (Addendum)
[facility] Emergency Department Provider Note  ____________________________________________   I have reviewed the triage vital signs and the nursing notes.   HISTORY  Chief Complaint Dizziness    HPI Brittany Cain is a 55 y.o. female who presents today feeling somewhat fatigued and lightheaded. She states that she has had no chest pain or shortness of breath and nausea no vomiting no dysuria no urinary frequency. The last week she was seen in her doctor and it was determined that she had multiple different infections including "bronchitis and an ear infection and sinusitis" all the symptoms seemed to have cleared up however she was called on the phone and told that the doxycycline which she is on for her urine infection is not covering it. Patient states she became lightheaded today. She denies any focal numbness or weakness or fever or chills or vomiting or any other acute pathology or symptom. Review of systems is otherwise negative except for she feels somewhat unwell. She thinks is likely because of the partially treated urinary tract infection. Review of records suggests the patient has a Proteus infection which is resistant to tetracycline, but sensitive to everything else except for Macrobid. Patient states that they did call in a new antibiotic to her pharmacy but she has no idea what that antibiotic is, and she has not yet picked that up. Of note by lightheadedness, patient specifically denies vertiginous symptoms or any postural symptoms.   Past Medical History  Diagnosis Date  . Hypertension   . Anxiety   . Depression   . Chronic back pain   . GERD (gastroesophageal reflux disease)   . Restless leg syndrome     Patient Active Problem List   Diagnosis Date Noted  . Major depressive disorder, recurrent episode, severe (HCC) 04/09/2014  . MDD (major depressive disorder), recurrent episode, severe (HCC) 04/08/2014  . GAD (generalized anxiety disorder)  03/25/2014    Past Surgical History  Procedure Laterality Date  . Abdominal hysterectomy    . Cholecystectomy    . Back surgery      Current Outpatient Rx  Name  Route  Sig  Dispense  Refill  . ARIPiprazole (ABILIFY) 5 MG tablet   Oral   Take 1 tablet (5 mg total) by mouth daily.   30 tablet   0   . buPROPion (WELLBUTRIN SR) 150 MG 12 hr tablet   Oral   Take 150 mg by mouth 2 (two) times daily.         . celecoxib (CELEBREX) 200 MG capsule   Oral   Take 1 capsule (200 mg total) by mouth 2 (two) times daily.   60 capsule   0   . citalopram (CELEXA) 10 MG tablet   Oral   Take 3 tablets (30 mg total) by mouth daily.   90 tablet   0   . eszopiclone (LUNESTA) 1 MG TABS tablet   Oral   Take 3 mg by mouth at bedtime. Take immediately before bedtime         . gabapentin (NEURONTIN) 100 MG capsule   Oral   Take 1 capsule (100 mg total) by mouth 3 (three) times daily.   30 capsule   0   . lisinopril (PRINIVIL,ZESTRIL) 20 MG tablet   Oral   Take 1 tablet (20 mg total) by mouth every morning.         Marland Kitchen losartan (COZAAR) 100 MG tablet   Oral   Take 100 mg by mouth  daily.         . RABEprazole (ACIPHEX) 20 MG tablet   Oral   Take 1 tablet (20 mg total) by mouth every morning.         Marland Kitchen. rOPINIRole (REQUIP) 0.25 MG tablet   Oral   Take 1 tablet (0.25 mg total) by mouth at bedtime.   30 tablet   0   . traZODone (DESYREL) 100 MG tablet   Oral   Take 1 tablet (100 mg total) by mouth at bedtime as needed for sleep.   30 tablet   0     Allergies Ceftin and Vilazodone hcl  No family history on file.  Social History Social History  Substance Use Topics  . Smoking status: Never Smoker   . Smokeless tobacco: Never Used  . Alcohol Use: No    Review of Systems Constitutional: No fever/chills Eyes: No visual changes. ENT: No sore throat. No stiff neck no neck pain Cardiovascular: Denies chest pain. Respiratory: Denies shortness of  breath. Gastrointestinal:   no vomiting.  No diarrhea.  No constipation. Genitourinary: Negative for dysuria. Musculoskeletal: Negative lower extremity swelling Skin: Negative for rash. Neurological: Negative for headaches, focal weakness or numbness. 10-point ROS otherwise negative.  ____________________________________________   PHYSICAL EXAM:  VITAL SIGNS: ED Triage Vitals  Enc Vitals Group     BP 06/11/16 1153 148/83 mmHg     Pulse Rate 06/11/16 1153 60     Resp 06/11/16 1153 16     Temp 06/11/16 1153 97.8 F (36.6 C)     Temp Source 06/11/16 1153 Oral     SpO2 06/11/16 1153 100 %     Weight 06/11/16 1153 223 lb (101.152 kg)     Height 06/11/16 1153 5\' 3"  (1.6 m)     Head Cir --      Peak Flow --      Pain Score 06/11/16 1155 0     Pain Loc --      Pain Edu? --      Excl. in GC? --     Constitutional: Alert and oriented. Well appearing and in no acute distress. Eyes: Conjunctivae are normal. PERRL. EOMI. Head: Atraumatic. Nose: No congestion/rhinnorhea.TMs are normal bilaterally Mouth/Throat: Mucous membranes are moist.  Oropharynx non-erythematous. Neck: No stridor.   Nontender with no meningismus Cardiovascular: Normal rate, regular rhythm. Grossly normal heart sounds.  Good peripheral circulation. Respiratory: Normal respiratory effort.  No retractions. Lungs CTAB. Abdominal: Soft and nontender. No distention. No guarding no rebound Back:  There is no focal tenderness or step off there is no midline tenderness there are no lesions noted. there is no CVA tenderness Musculoskeletal: No lower extremity tenderness. No joint effusions, no DVT signs strong distal pulses no edema Neurologic:  Normal speech and language. No gross focal neurologic deficits are appreciated.  Skin:  Skin is warm, dry and intact. No rash noted. Psychiatric: Mood and affect are normal. Speech and behavior are normal.  ____________________________________________   LABS (all labs ordered  are listed, but only abnormal results are displayed)  Labs Reviewed  URINE CULTURE  BASIC METABOLIC PANEL  CBC  URINALYSIS COMPLETEWITH MICROSCOPIC ([facility] ONLY)  CBG MONITORING, ED   ____________________________________________  EKG  I personally interpreted any EKGs ordered by me or triage  ____________________________________________  RADIOLOGY  I reviewed any imaging ordered by me or triage that were performed during my shift and, if possible, patient and/or family made aware of any abnormal findings. ____________________________________________   PROCEDURES  Procedure(s) performed: None  Critical Care performed: None  ____________________________________________   INITIAL IMPRESSION / ASSESSMENT AND PLAN / ED COURSE  Pertinent labs & imaging results that were available during my care of the patient were reviewed by me and considered in my medical decision making (see chart for details).  Patient's vital signs are reassuring blood work is reassuring EKG is reassuring her lodging exam is reassuring blood work is reassuring, she does have a partially treated Proteus infection which we will treat with Cipro. The bioavailability of Cipro similar IV as orally. She does not appear to be dehydrated. There is no evidence of ACS PE dissection myocarditis endocarditis pericarditis intra-abdominal pathology such as appendicitis, bladder disease or diverticular disease nor is there any evidence of pyelonephritis. We'll treat her with a more appropriate antibiotic, discharge her home with close patient follow-up and return precautions given and understood.  ----------------------------------------- 4:15 PM on 06/11/2016 -----------------------------------------  Patient has no symptoms or complaints at this time she feels much better. Urine looks good here but we do noticed that her cultures from the PCP do show a urinary tract infection therefore we'll treat her with Cipro to which  it seems to be sensitive. No evidence of dehydration, again very reassuring workup thus far. ____________________________________________   FINAL CLINICAL IMPRESSION(S) / ED DIAGNOSES  Final diagnoses:  None      This chart was dictated using voice recognition software.  Despite best efforts to proofread,  errors can occur which can change meaning.     Jeanmarie PlantJames A McShane, MD 06/11/16 1548  Jeanmarie PlantJames A McShane, MD 06/11/16 1550  Jeanmarie PlantJames A McShane, MD 06/11/16 [phone]

## 2016-06-11 NOTE — ED Notes (Signed)
Seen by PCP last week and diagnosed with Sinus infection, bladder infection, and bronchitis.  Patient was started on Doxycycline and cough medicine.  Today, patient was called by PCP to change antibiotic because it was not effective for bladder infection.  RX called into patient's pharmacy.  Patient sent to ED from PCP because patient c/o general fatigue and dizziness today.

## 2016-06-12 LAB — URINE CULTURE: Culture: 4000 — AB

## 2016-10-11 ENCOUNTER — Other Ambulatory Visit: Payer: Self-pay | Admitting: Obstetrics and Gynecology

## 2016-10-11 DIAGNOSIS — Z1231 Encounter for screening mammogram for malignant neoplasm of breast: Secondary | ICD-10-CM

## 2016-11-14 ENCOUNTER — Ambulatory Visit
Admission: RE | Admit: 2016-11-14 | Discharge: 2016-11-14 | Disposition: A | Discharge status: Home / Self Care | Payer: BLUE CROSS/BLUE SHIELD | Source: Ambulatory Visit | Attending: Obstetrics and Gynecology | Admitting: Obstetrics and Gynecology

## 2016-11-14 DIAGNOSIS — Z1231 Encounter for screening mammogram for malignant neoplasm of breast: Secondary | ICD-10-CM | POA: Diagnosis not present

## 2016-11-21 ENCOUNTER — Other Ambulatory Visit: Payer: Self-pay | Admitting: *Deleted

## 2016-11-21 ENCOUNTER — Inpatient Hospital Stay
Admission: RE | Admit: 2016-11-21 | Discharge: 2016-11-21 | Disposition: A | Discharge status: Home / Self Care | Payer: Self-pay | Source: Ambulatory Visit | Attending: *Deleted | Admitting: *Deleted

## 2016-11-21 DIAGNOSIS — Z9289 Personal history of other medical treatment: Secondary | ICD-10-CM

## 2017-05-22 ENCOUNTER — Other Ambulatory Visit: Payer: Self-pay | Admitting: Gastroenterology

## 2017-05-22 DIAGNOSIS — R1013 Epigastric pain: Secondary | ICD-10-CM

## 2017-05-22 DIAGNOSIS — R17 Unspecified jaundice: Secondary | ICD-10-CM

## 2017-05-24 ENCOUNTER — Ambulatory Visit
Admission: RE | Admit: 2017-05-24 | Discharge: 2017-05-24 | Disposition: A | Discharge status: Home / Self Care | Payer: BLUE CROSS/BLUE SHIELD | Source: Ambulatory Visit | Attending: Gastroenterology | Admitting: Gastroenterology

## 2017-05-24 ENCOUNTER — Encounter
Admission: RE | Disposition: A | Discharge status: Home / Self Care | Payer: Self-pay | Source: Ambulatory Visit | Attending: Gastroenterology

## 2017-05-24 ENCOUNTER — Ambulatory Visit: Payer: BLUE CROSS/BLUE SHIELD | Admitting: Anesthesiology

## 2017-05-24 DIAGNOSIS — I10 Essential (primary) hypertension: Secondary | ICD-10-CM | POA: Diagnosis not present

## 2017-05-24 DIAGNOSIS — K259 Gastric ulcer, unspecified as acute or chronic, without hemorrhage or perforation: Secondary | ICD-10-CM | POA: Diagnosis not present

## 2017-05-24 DIAGNOSIS — K2950 Unspecified chronic gastritis without bleeding: Secondary | ICD-10-CM | POA: Diagnosis not present

## 2017-05-24 DIAGNOSIS — K2960 Other gastritis without bleeding: Secondary | ICD-10-CM | POA: Insufficient documentation

## 2017-05-24 DIAGNOSIS — K219 Gastro-esophageal reflux disease without esophagitis: Secondary | ICD-10-CM | POA: Diagnosis not present

## 2017-05-24 DIAGNOSIS — G8929 Other chronic pain: Secondary | ICD-10-CM | POA: Diagnosis not present

## 2017-05-24 DIAGNOSIS — F419 Anxiety disorder, unspecified: Secondary | ICD-10-CM | POA: Diagnosis not present

## 2017-05-24 DIAGNOSIS — Z6841 Body Mass Index (BMI) 40.0 and over, adult: Secondary | ICD-10-CM | POA: Diagnosis not present

## 2017-05-24 DIAGNOSIS — R1013 Epigastric pain: Secondary | ICD-10-CM | POA: Diagnosis present

## 2017-05-24 DIAGNOSIS — Z87442 Personal history of urinary calculi: Secondary | ICD-10-CM | POA: Diagnosis not present

## 2017-05-24 DIAGNOSIS — G2581 Restless legs syndrome: Secondary | ICD-10-CM | POA: Insufficient documentation

## 2017-05-24 DIAGNOSIS — F329 Major depressive disorder, single episode, unspecified: Secondary | ICD-10-CM | POA: Insufficient documentation

## 2017-05-24 DIAGNOSIS — K317 Polyp of stomach and duodenum: Secondary | ICD-10-CM | POA: Insufficient documentation

## 2017-05-24 DIAGNOSIS — E669 Obesity, unspecified: Secondary | ICD-10-CM | POA: Insufficient documentation

## 2017-05-24 HISTORY — DX: Personal history of urinary calculi: Z87.442

## 2017-05-24 HISTORY — DX: Varicella without complication: B01.9

## 2017-05-24 HISTORY — DX: Obesity, unspecified: E66.9

## 2017-05-24 HISTORY — PX: ESOPHAGOGASTRODUODENOSCOPY (EGD) WITH PROPOFOL: SHX5813

## 2017-05-24 SURGERY — ESOPHAGOGASTRODUODENOSCOPY (EGD) WITH PROPOFOL
Anesthesia: General

## 2017-05-24 MED ORDER — GLYCOPYRROLATE 0.2 MG/ML IJ SOLN
INTRAMUSCULAR | Status: DC | PRN
  Administered 2017-05-24: 0.1 mg via INTRAVENOUS

## 2017-05-24 MED ORDER — PROPOFOL 500 MG/50ML IV EMUL
INTRAVENOUS | Status: DC | PRN
  Administered 2017-05-24: 150 ug/kg/min via INTRAVENOUS

## 2017-05-24 MED ORDER — LIDOCAINE HCL (PF) 1 % IJ SOLN
2.0000 mL | Freq: Once | INTRAMUSCULAR | Status: AC
  Administered 2017-05-24: 0.3 mL via INTRADERMAL
  Filled 2017-05-24: qty 2

## 2017-05-24 MED ORDER — GLYCOPYRROLATE 0.2 MG/ML IJ SOLN
INTRAMUSCULAR | Status: AC
  Filled 2017-05-24: qty 1

## 2017-05-24 MED ORDER — LIDOCAINE HCL (CARDIAC) 20 MG/ML IV SOLN
INTRAVENOUS | Status: DC | PRN
  Administered 2017-05-24: 100 mg via INTRAVENOUS

## 2017-05-24 MED ORDER — MIDAZOLAM HCL 2 MG/2ML IJ SOLN
INTRAMUSCULAR | Status: DC | PRN
  Administered 2017-05-24: 2 mg via INTRAVENOUS

## 2017-05-24 MED ORDER — SODIUM CHLORIDE 0.9 % IV SOLN
INTRAVENOUS | Status: DC
  Administered 2017-05-24: 1000 mL via INTRAVENOUS
  Administered 2017-05-24: 13:00:00 via INTRAVENOUS

## 2017-05-24 MED ORDER — PROPOFOL 500 MG/50ML IV EMUL
INTRAVENOUS | Status: AC
  Filled 2017-05-24: qty 50

## 2017-05-24 MED ORDER — MIDAZOLAM HCL 2 MG/2ML IJ SOLN
INTRAMUSCULAR | Status: AC
  Filled 2017-05-24: qty 2

## 2017-05-24 MED ORDER — SODIUM CHLORIDE 0.9 % IV SOLN: INTRAVENOUS | Status: DC

## 2017-05-24 MED ORDER — PROPOFOL 10 MG/ML IV BOLUS
INTRAVENOUS | Status: DC | PRN
  Administered 2017-05-24 (×2): 50 mg via INTRAVENOUS

## 2017-05-24 MED ORDER — LIDOCAINE HCL (PF) 2 % IJ SOLN
INTRAMUSCULAR | Status: AC
  Filled 2017-05-24: qty 2

## 2017-05-24 NOTE — Anesthesia Post-op Follow-up Note (Cosign Needed)
Anesthesia QCDR form completed.        

## 2017-05-24 NOTE — Anesthesia Preprocedure Evaluation (Addendum)
Anesthesia Evaluation  Patient identified by MRN, date of birth, ID band Patient awake    Reviewed: Allergy & Precautions, NPO status , Patient's Chart, lab work & pertinent test results  Airway Mallampati: III       Dental  (+) Teeth Intact   Pulmonary neg pulmonary ROS,    breath sounds clear to auscultation       Cardiovascular Exercise Tolerance: Good hypertension, Pt. on medications  Rhythm:Regular     Neuro/Psych Anxiety Depression negative neurological ROS     GI/Hepatic Neg liver ROS, GERD  Medicated,  Endo/Other  negative endocrine ROS  Renal/GU negative Renal ROS     Musculoskeletal negative musculoskeletal ROS (+)   Abdominal (+) + obese,   Peds negative pediatric ROS (+)  Hematology negative hematology ROS (+)   Anesthesia Other Findings   Reproductive/Obstetrics                            Anesthesia Physical Anesthesia Plan  ASA: II  Anesthesia Plan: General   Post-op Pain Management:    Induction: Intravenous  PONV Risk Score and Plan: 2 and Ondansetron, Dexamethasone and Treatment may vary due to age  Airway Management Planned:   Additional Equipment:   Intra-op Plan:   Post-operative Plan:   Informed Consent: I have reviewed the patients History and Physical, chart, labs and discussed the procedure including the risks, benefits and alternatives for the proposed anesthesia with the patient or authorized representative who has indicated his/her understanding and acceptance.     Plan Discussed with: CRNA  Anesthesia Plan Comments:         Anesthesia Quick Evaluation

## 2017-05-24 NOTE — Op Note (Signed)
[facility] Gastroenterology Patient Name: Brittany PeerDonna Stehlin Procedure Date: 05/24/2017 12:59 PM MRN: [id number] Account #: [id number] Date of Birth: March 01, 1961 Admit Type: Outpatient Age: [age] [facility] 4 Gender: Female Note Status: Finalized Procedure:            Upper GI endoscopy Indications:          Dyspepsia, Gastro-esophageal reflux disease Providers:            Christena DeemMartin U. Zariel Capano, MD Referring MD:         Marisue IvanKanhka Linthavong (Referring MD) Medicines:            Monitored Anesthesia Care Complications:        No immediate complications. Procedure:            Pre-Anesthesia Assessment:                       - ASA Grade Assessment: III - A patient with severe                        systemic disease.                       After obtaining informed consent, the endoscope was                        passed under direct vision. Throughout the procedure,                        the patient's blood pressure, pulse, and oxygen                        saturations were monitored continuously. The Endoscope                        was introduced through the mouth, and advanced to the                        third part of duodenum. The upper GI endoscopy was                        accomplished without difficulty. The patient tolerated                        the procedure well. Findings:      LA Grade B (one or more mucosal breaks greater than 5 mm, not extending       between the tops of two mucosal folds) esophagitis with no bleeding was       found. Biopsies were taken with a cold forceps for histology.      Diffuse and patchy moderate inflammation characterized by adherent blood       and erythema was found in the gastric body and in the gastric antrum.       Biopsies were taken with a cold forceps for histology. Biopsies were       taken with a cold forceps for Helicobacter pylori testing.      A single non-bleeding erosion was found at the pylorus. There were no      stigmata of recent bleeding.      The cardia and gastric fundus were normal on retroflexion otherwise..      The examined duodenum was  normal. Impression:           - LA Grade B erosive esophagitis. Biopsied.                       - Bile erosive gastritis. Biopsied.                       - Gastric erosion without bleeding.                       - Normal examined duodenum. Recommendation:       - Await pathology results.                       - Use sucralfate tablets 1 gram PO QID daily.                       - Use Zantac (ranitidine) 150 mg PO TID.                       - Return to GI clinic in 4 weeks.                       - Await pathology results.                       - Repeat upper endoscopy in 8 weeks to check healing. Procedure Code(s):    --- Professional ---                       [phone], Esophagogastroduodenoscopy, flexible, transoral;                        with biopsy, single or multiple Diagnosis Code(s):    --- Professional ---                       K20.8, Other esophagitis                       K29.60, Other gastritis without bleeding                       K25.9, Gastric ulcer, unspecified as acute or chronic,                        without hemorrhage or perforation                       R10.13, Epigastric pain                       K21.9, Gastro-esophageal reflux disease without                        esophagitis CPT copyright 2016 American Medical Association. All rights reserved. The codes documented in this report are preliminary and upon coder review may  be revised to meet current compliance requirements. Christena Deem, MD 05/24/2017 1:48:59 PM This report has been signed electronically. Number of Addenda: 0 Note Initiated On: 05/24/2017 12:59 PM      [facility]

## 2017-05-24 NOTE — Transfer of Care (Signed)
Immediate Anesthesia Transfer of Care Note  Patient: Devoria AlbeDonna P Dec  Procedure(s) Performed: Procedure(s): ESOPHAGOGASTRODUODENOSCOPY (EGD) WITH PROPOFOL (N/A)  Patient Location: Endoscopy Unit  Anesthesia Type:General  Level of Consciousness: awake, oriented and patient cooperative  Airway & Oxygen Therapy: Patient Spontanous Breathing and Patient connected to nasal cannula oxygen  Post-op Assessment: Report given to RN, Post -op Vital signs reviewed and stable and Patient moving all extremities X 4  Post vital signs: Reviewed and stable  Last Vitals:  Vitals:   05/24/17 1223  BP: (!) 158/108  Resp: 17  Temp: 36.2 C    Last Pain:  Vitals:   05/24/17 1223  TempSrc: Tympanic  PainSc: 1          Complications: No apparent anesthesia complications

## 2017-05-24 NOTE — H&P (Signed)
Outpatient short stay form Pre-procedure 05/24/2017 1:13 PM Brittany DeemMartin U Skulskie MD  Primary Physician: Dr Marisue IvanKanhka Linthavong  Reason for visit:  EGD  History of present illness:  Patient is a 56 year old female with known history of gastroesophageal reflux. She has had problems with taking proton pump inhibitors as a have been reported to give her hives and angioedema. She is now off of those. She states that she did not have any problems taking the AcipHex branded tablet however when she was changed to generic rabeprazole she also had that reaction to it as well. She did try pantoprazole also giving her similar reaction. She is currently taking ranitidine. She states that her symptoms seemed to begin after having her cholecystectomy. She will have a combined burning/better reflux.    Current Facility-Administered Medications:  .  0.9 %  sodium chloride infusion, , Intravenous, Continuous, Brittany DeemSkulskie, Martin U, MD, Last Rate: 20 mL/hr at 05/24/17 1249, 1,000 mL at 05/24/17 1249 .  0.9 %  sodium chloride infusion, , Intravenous, Continuous, Brittany DeemSkulskie, Martin U, MD  Prescriptions Prior to Admission  Medication Sig Dispense Refill Last Dose  . acetaminophen (TYLENOL) 325 MG tablet Take 650 mg by mouth every 6 (six) hours as needed.     Marland Kitchen. atorvastatin (LIPITOR) 20 MG tablet Take 20 mg by mouth at bedtime.     Marland Kitchen. buPROPion (WELLBUTRIN SR) 200 MG 12 hr tablet Take 200 mg by mouth 2 (two) times daily.     . Cranberry (THERACRAN PO) Take 650 mg by mouth.     . cyanocobalamin 1000 MCG tablet Take 1,000 mcg by mouth daily.     . diphenhydrAMINE (BENADRYL) 25 mg capsule Take 25 mg by mouth every 6 (six) hours as needed.     . loratadine (CLARITIN) 10 MG tablet Take 10 mg by mouth daily.     Marland Kitchen. pyridOXINE (VITAMIN B-6) 50 MG tablet Take 50 mg by mouth 2 (two) times daily.     . ranitidine (ZANTAC) 150 MG tablet Take 150 mg by mouth daily.     . ARIPiprazole (ABILIFY) 5 MG tablet Take 1 tablet (5 mg total) by  mouth daily. 30 tablet 0   . buPROPion (WELLBUTRIN SR) 150 MG 12 hr tablet Take 150 mg by mouth 2 (two) times daily.   Not Taking at Unknown time  . celecoxib (CELEBREX) 200 MG capsule Take 1 capsule (200 mg total) by mouth 2 (two) times daily. 60 capsule 0 04/08/2014 at Unknown time  . citalopram (CELEXA) 10 MG tablet Take 3 tablets (30 mg total) by mouth daily. 90 tablet 0 04/08/2014 at Unknown time  . eszopiclone (LUNESTA) 1 MG TABS tablet Take 3 mg by mouth at bedtime. Take immediately before bedtime   07/20/2015 at Unknown time  . gabapentin (NEURONTIN) 100 MG capsule Take 1 capsule (100 mg total) by mouth 3 (three) times daily. 30 capsule 0 07/21/2015 at Unknown time  . lisinopril (PRINIVIL,ZESTRIL) 20 MG tablet Take 1 tablet (20 mg total) by mouth every morning.   04/08/2014 at Unknown time  . losartan (COZAAR) 100 MG tablet Take 100 mg by mouth daily.   07/21/2015 at Unknown time  . RABEprazole (ACIPHEX) 20 MG tablet Take 1 tablet (20 mg total) by mouth every morning.   07/21/2015 at Unknown time  . rOPINIRole (REQUIP) 0.25 MG tablet Take 1 tablet (0.25 mg total) by mouth at bedtime. 30 tablet 0 07/20/2015 at Unknown time  . traZODone (DESYREL) 100 MG tablet Take 1 tablet (100  mg total) by mouth at bedtime as needed for sleep. 30 tablet 0 04/07/2014 at Unknown time     Allergies  Allergen Reactions  . Aciphex [Rabeprazole] Swelling  . Ceftin [Cefuroxime Axetil] Hives  . Protonix [Pantoprazole] Hives and Swelling  . Vilazodone Hcl Hives and Other (See Comments)    Tongue swelling     Past Medical History:  Diagnosis Date  . Anxiety   . Chicken pox   . Chronic back pain   . Depression   . GERD (gastroesophageal reflux disease)   . History of kidney stones   . Hypertension   . Obesity   . Restless leg syndrome     Review of systems:      Physical Exam    Heart and lungs: Regular rate and rhythm without rub or gallop, lungs are bilaterally clear.    HEENT: Normocephalic atraumatic  eyes are anicteric    Other:     Pertinant exam for procedure: Soft nontender nondistended bowel sounds positive normoactive.    Planned proceedures: EGD and indicated procedures. I have discussed the risks benefits and complications of procedures to include not limited to bleeding, infection, perforation and the risk of sedation and the patient wishes to proceed.    Brittany Deem, MD Gastroenterology 05/24/2017  1:13 PM

## 2017-05-27 ENCOUNTER — Encounter: Payer: Self-pay | Admitting: Gastroenterology

## 2017-05-27 LAB — SURGICAL PATHOLOGY

## 2017-05-28 NOTE — Anesthesia Postprocedure Evaluation (Signed)
Anesthesia Post Note  Patient: Brittany Cain  Procedure(s) Performed: Procedure(s) (LRB): ESOPHAGOGASTRODUODENOSCOPY (EGD) WITH PROPOFOL (N/A)  Patient location during evaluation: PACU Anesthesia Type: General Level of consciousness: awake Pain management: pain level controlled Vital Signs Assessment: post-procedure vital signs reviewed and stable Respiratory status: spontaneous breathing Cardiovascular status: stable Anesthetic complications: no     Last Vitals:  Vitals:   05/24/17 1223 05/24/17 1344  BP: (!) 158/108   Resp: 17   Temp: 36.2 C 36.6 C    Last Pain:  Vitals:   05/24/17 1344  TempSrc: Tympanic  PainSc:                  VAN STAVEREN,Jazleen Robeck

## 2017-05-31 ENCOUNTER — Ambulatory Visit
Admission: RE | Admit: 2017-05-31 | Discharge: 2017-05-31 | Disposition: A | Discharge status: Home / Self Care | Payer: BLUE CROSS/BLUE SHIELD | Source: Ambulatory Visit | Attending: Gastroenterology | Admitting: Gastroenterology

## 2017-05-31 DIAGNOSIS — R140 Abdominal distension (gaseous): Secondary | ICD-10-CM | POA: Diagnosis not present

## 2017-05-31 DIAGNOSIS — R1013 Epigastric pain: Secondary | ICD-10-CM

## 2017-05-31 DIAGNOSIS — Z9049 Acquired absence of other specified parts of digestive tract: Secondary | ICD-10-CM | POA: Insufficient documentation

## 2017-05-31 DIAGNOSIS — K769 Liver disease, unspecified: Secondary | ICD-10-CM | POA: Diagnosis not present

## 2017-05-31 DIAGNOSIS — R17 Unspecified jaundice: Secondary | ICD-10-CM

## 2017-05-31 DIAGNOSIS — K219 Gastro-esophageal reflux disease without esophagitis: Secondary | ICD-10-CM | POA: Diagnosis present

## 2017-09-23 ENCOUNTER — Encounter: Admission: RE | Payer: Self-pay | Source: Ambulatory Visit

## 2017-09-23 ENCOUNTER — Ambulatory Visit
Admission: RE | Admit: 2017-09-23 | Payer: BLUE CROSS/BLUE SHIELD | Source: Ambulatory Visit | Admitting: Gastroenterology

## 2017-09-23 SURGERY — ESOPHAGOGASTRODUODENOSCOPY (EGD) WITH PROPOFOL
Anesthesia: General

## 2019-03-17 ENCOUNTER — Other Ambulatory Visit: Payer: Self-pay | Admitting: Family Medicine

## 2019-03-17 DIAGNOSIS — K432 Incisional hernia without obstruction or gangrene: Secondary | ICD-10-CM

## 2019-03-26 ENCOUNTER — Ambulatory Visit
Admission: RE | Admit: 2019-03-26 | Discharge: 2019-03-26 | Disposition: A | Discharge status: Home / Self Care | Payer: BLUE CROSS/BLUE SHIELD | Source: Ambulatory Visit | Attending: Family Medicine | Admitting: Family Medicine

## 2019-03-26 ENCOUNTER — Ambulatory Visit: Payer: BLUE CROSS/BLUE SHIELD

## 2019-03-26 ENCOUNTER — Other Ambulatory Visit: Payer: Self-pay

## 2019-03-26 DIAGNOSIS — K432 Incisional hernia without obstruction or gangrene: Secondary | ICD-10-CM | POA: Insufficient documentation

## 2019-03-26 LAB — POCT I-STAT CREATININE: Creatinine, Ser: 0.9 mg/dL (ref 0.44–1.00)

## 2019-03-26 MED ORDER — IOHEXOL 300 MG/ML  SOLN
100.0000 mL | Freq: Once | INTRAMUSCULAR | Status: AC | PRN
  Administered 2019-03-26: 10:00:00 100 mL via INTRAVENOUS

## 2019-04-08 ENCOUNTER — Ambulatory Visit: Payer: BLUE CROSS/BLUE SHIELD

## 2019-04-22 ENCOUNTER — Ambulatory Visit: Payer: Self-pay | Admitting: General Surgery

## 2019-04-22 NOTE — H&P (Signed)
PATIENT PROFILE: Brittany Cain is a 58 y.o. female who presents to the Clinic for consultation at the request of Dr. Netty Starring for evaluation of ventral hernia.  PCP:  Dion Body, MD  HISTORY OF PRESENT ILLNESS: Ms. Jeffords reports she has been having pain on the abdomen since couple of months ago.  She reports that the pain is on the midline below her bellybutton and radiates caudally and to the right lower quadrant.  She reports that pain is aggravated by applying pressure on the area and after a long day of walking and working.  She is currently taking care of her 4 grandchildren.  She denies any nausea or vomiting.  Alleviating factor is resting and trying to reduce it but it does not reduce completely.  She does take pain medication for her back pain that sometimes also help with the abdominal pain.  Pain is intermittent but when it comes it is really significant.  Patient has history of hysterectomy and C-section with a infraumbilical midline scar and a Pfannenstiel scar.    PROBLEM LIST:         Problem List  Date Reviewed: 04/20/2019         Noted   Ventral hernia without obstruction or gangrene 04/20/2019   Mixed hyperlipidemia (Trig 182, LDL 160 - 12/05/18) 02/15/2016   Moderate episode of recurrent major depressive disorder (CMS-HCC) Unknown   Gastroesophageal reflux disease without esophagitis Unknown   Essential hypertension Unknown   Restless leg syndrome Unknown   Chronic back pain Unknown   Overview    S/P surgery 2007 per Dr. Trenton Gammon- Neurosurgery, [place]      Morbid obesity due to excess calories (CMS-HCC) Unknown      GENERAL REVIEW OF SYSTEMS:   General ROS: negative for - chills, fatigue, fever, weight gain or weight loss Allergy and Immunology ROS: negative for - hives  Hematological and Lymphatic ROS: negative for - bleeding problems or bruising, negative for palpable nodes Endocrine ROS: negative for - heat or cold intolerance, hair  changes Respiratory ROS: negative for - cough, shortness of breath or wheezing Cardiovascular ROS: no chest pain or palpitations GI ROS: negative for nausea, vomiting, diarrhea, positive for constipation and heartburn.  Positive for abdominal pain Musculoskeletal ROS: negative for - joint swelling or muscle pain Neurological ROS: negative for - confusion, syncope.  Positive for headache Dermatological ROS: negative for pruritus and rash Psychiatric: Positive for anxiety, depression, difficulty sleeping and memory loss  MEDICATIONS: CurrentMedications        Current Outpatient Medications  Medication Sig Dispense Refill  . acetaminophen (TYLENOL) 325 MG tablet Take 650 mg by mouth as needed for Pain.    Marland Kitchen atorvastatin (LIPITOR) 40 MG tablet TAKE 1 TABLET BY MOUTH EVERY DAY 90 tablet 1  . buPROPion (WELLBUTRIN SR) 200 MG SR tablet TAKE 1 TABLET BY MOUTH TWICE A DAY 180 tablet 1  . cranberry extract (THERACRAN) 650 mg Cap Take 1 capsule by mouth once daily      . cyanocobalamin (VITAMIN B12) 1000 MCG tablet Take 1,000 mcg by mouth once daily.    . famotidine (PEPCID) 20 MG tablet Take 1 tablet (20 mg total) by mouth 2 (two) times daily 60 tablet 3  . loratadine (CLARITIN) 10 mg tablet Take 10 mg by mouth once daily.    Marland Kitchen losartan (COZAAR) 100 MG tablet TAKE 1 TABLET (100 MG TOTAL) BY MOUTH ONCE DAILY. 90 tablet 1  . omeprazole (PRILOSEC OTC) 20 MG EC tablet Take  1 tablet (20 mg total) by mouth once daily 30 tablet 3  . pyridoxine, vitamin B6, (VITAMIN B-6) 50 MG tablet Take 50 mg by mouth 2 (two) times daily.    Marland Kitchen rOPINIRole (REQUIP) 0.25 MG tablet TAKE 1 TABLET (0.25 MG TOTAL) BY MOUTH NIGHTLY. 90 tablet 1   No current facility-administered medications for this visit.       ALLERGIES: Aciphex [rabeprazole]; Bactrim [sulfamethoxazole-trimethoprim]; Ceftin [cefuroxime axetil]; Pantoprazole; Viibryd [vilazodone]; and Vilazodone hcl  PAST MEDICAL HISTORY:     Past Medical  History:  Diagnosis Date  . Anxiety   . Chronic back pain    S/P surgery 2007 per Dr. Trenton Gammon- Neurosurgery, [place]  . Depression   . GERD (gastroesophageal reflux disease)   . History of chicken pox   . History of kidney stones   . Hyperlipidemia   . Hypertension   . Obesity   . Postmenopausal    Mild menopausal syndrome- previously on HRT  . Restless leg syndrome     PAST SURGICAL HISTORY: Past Surgical History:  Procedure Laterality Date  . CESAREAN SECTION    . CHOLECYSTECTOMY    . EGD  05/24/2017   Chronic gastritis/Erosive esophagitis/Repeat 8 wks/MUS  . HYSTERECTOMY    . Lower back surgery       FAMILY HISTORY:      Family History  Problem Relation Age of Onset  . Stroke Mother        3 or more   . Prostate cancer Father   . Coronary Artery Disease (Blocked arteries around heart) Sister      SOCIAL HISTORY: Social History          Socioeconomic History  . Marital status: Married    Spouse name: Not on file  . Number of children: Not on file  . Years of education: Not on file  . Highest education level: Not on file  Occupational History  . Not on file  Social Needs  . Financial resource strain: Not on file  . Food insecurity:    Worry: Not on file    Inability: Not on file  . Transportation needs:    Medical: Not on file    Non-medical: Not on file  Tobacco Use  . Smoking status: Never Smoker  . Smokeless tobacco: Never Used  Substance and Sexual Activity  . Alcohol use: No    Alcohol/week: 0.0 standard drinks  . Drug use: No  . Sexual activity: Never  Other Topics Concern  . 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 Weight:  (!) 104.3 kg (229 lb 15 oz)   GENERAL: Alert, active, oriented x3  HEENT: Pupils equal reactive to light. Extraocular movements are intact. Sclera clear. Palpebral conjunctiva normal red color.Pharynx clear.  NECK: Supple with no palpable mass and no adenopathy.  LUNGS: Sound clear with no rales rhonchi or wheezes.  HEART: Regular rhythm S1 and S2 without murmur.  ABDOMEN: Soft and depressible, nontender with no palpable mass, no hepatomegaly.  There is a medium sized nonreducible incisional hernia below her navel.  Mildly tender to palpation.  Unable to be reduced.  No skin changes around.  No peritoneal  signs.  EXTREMITIES: Well-developed well-nourished symmetrical with no dependent edema.  NEUROLOGICAL: Awake alert oriented, facial expression symmetrical, moving all extremities.  REVIEW OF DATA: I have reviewed the following data today:      No visits with results within 3 Month(s) from this visit.  Latest known visit with results is:  Appointment on 12/05/2018  Component Date Value  . Glucose 12/05/2018 88   . Sodium 12/05/2018 139   . Potassium 12/05/2018 4.2   . Chloride 12/05/2018 104   . Carbon Dioxide (CO2) 12/05/2018 29.2   . Urea Nitrogen (BUN) 12/05/2018 12   . Creatinine 12/05/2018 0.8   . Glomerular Filtration Ra* 12/05/2018 74   . Calcium 12/05/2018 9.2   . AST  12/05/2018 26   . ALT  12/05/2018 45*  . Alk Phos (alkaline Phosp* 12/05/2018 67   . Albumin 12/05/2018 4.1   . Bilirubin, Total 12/05/2018 1.0   . Protein, Total 12/05/2018 6.3   . A/G Ratio 12/05/2018 1.9   . Cholesterol, Total 12/05/2018 231*  . Triglyceride 12/05/2018 182   . HDL (High Density Lipopr* 12/05/2018 34.9*  . LDL Calculated 12/05/2018 160*  . VLDL Cholesterol 12/05/2018 36   . Cholesterol/HDL Ratio 12/05/2018 6.6   . Hemoglobin A1C 12/05/2018 5.6   . Average Blood Glucose (C* 12/05/2018 114      ASSESSMENT: Ms. Bevans is a 58 y.o. female presenting for consultation  for ventral hernia evaluation.  Patient with an incisional hernia from the infraumbilical midline incision.  She is having significant remittent pain.  She was oriented about the diagnosis of incisional hernia.  She actually has 3 hernias close one to each other.  One is an umbilical hernia, and other small incisional hernia and a lower moderate size incisional hernia.  2 of them has chronic fat content material incarcerated.  No sign of strangulation.  There is no bowel getting to the hernia.  She also oriented about the surgical techniques including laparoscopic versus open hernia repair.  She was also oriented about the use of mesh to reinforce the repair.  The benefit of the mesh is to decrease the risk of recurrence.  Patient was oriented about the risk of surgery including infection, bleeding, injury to small bowel, bladder and other intra-abdominal structures.  Also discussed about complications such as small bowel obstruction, form of adhesions, intestinal fistulas, chronic pain.  Also discussed about the possibility of needing a second surgery for removal of the mesh if there is any complication with infection.  Patient also with obesity, but due to the pain that she is having I consider that doing the surgery at this moment will be of benefit instead of waiting for her to lose weight.  This will make the surgery more difficult and with slight increased risk of recurrence.  She also has other communicability such as hypertension and hyperlipidemia. Patient voiced that she understood the benefit and risks of the surgery.  Due to the amount of pain that she is having intermittently I consider that surgical management of this hernia is of benefit for the patient.  I think that we can wait until elective surgery are resumed.  I discussed with her that we are currently discussing the protocol for elective cases which may include needing to be tested for COVID-19.  We will call her with the surgical date and  further instructions before surgery.  Incisional hernia, without obstruction or gangrene [K43.2]  PLAN: 1. Laparoscopic incisional hernia repair with mesh (06301) 2. CBC,  CMP 3. Avoid taking aspirin 5 days before surgery.  4.  We will call you with further instructions regarding preoperative protocol and date of surgery.  Due to the situation of COVID-19, might need to be tested for the virus before surgery.  Patient verbalized understanding, all questions were answered, and were agreeable with the plan outlined above.   Herbert Pun, MD  Electronically signed by Herbert Pun, MD

## 2019-05-28 ENCOUNTER — Other Ambulatory Visit: Payer: Self-pay

## 2019-05-28 ENCOUNTER — Encounter
Admission: RE | Admit: 2019-05-28 | Discharge: 2019-05-28 | Disposition: A | Discharge status: Home / Self Care | Payer: BC Managed Care – PPO | Source: Ambulatory Visit | Attending: General Surgery | Admitting: General Surgery

## 2019-05-28 DIAGNOSIS — I10 Essential (primary) hypertension: Secondary | ICD-10-CM | POA: Diagnosis not present

## 2019-05-28 DIAGNOSIS — Z1159 Encounter for screening for other viral diseases: Secondary | ICD-10-CM | POA: Insufficient documentation

## 2019-05-28 DIAGNOSIS — Z01818 Encounter for other preprocedural examination: Secondary | ICD-10-CM | POA: Insufficient documentation

## 2019-05-28 NOTE — Patient Instructions (Signed)
Your procedure is scheduled on: Monday, June 01, 2019 Report to Day Surgery on the 2nd floor of the [facility]. To find out your arrival time, please call [phone] between 1PM - 3PM on: Friday, June 12  REMEMBER: Instructions that are not followed completely may result in serious medical risk, up to and including death; or upon the discretion of your surgeon and anesthesiologist your surgery may need to be rescheduled.  Do not eat food after midnight the night before surgery.  No gum chewing, lozengers or hard candies.  You may however, drink CLEAR liquids up to 2 hours before you are scheduled to arrive for your surgery. Do not drink anything within 2 hours of the start of your surgery.  Clear liquids include: - water  - apple juice without pulp - gatorade - black coffee or tea (Do NOT add milk or creamers to the coffee or tea) Do NOT drink anything that is not on this list.  No Alcohol for 24 hours before or after surgery.  No Smoking including e-cigarettes for 24 hours prior to surgery.  No chewable tobacco products for at least 6 hours prior to surgery.  No nicotine patches on the day of surgery.  On the morning of surgery brush your teeth with toothpaste and water, you may rinse your mouth with mouthwash if you wish. Do not swallow any toothpaste or mouthwash.  Notify your doctor if there is any change in your medical condition (cold, fever, infection).  Do not wear jewelry, make-up, hairpins, clips or nail polish.  Do not wear lotions, powders, or perfumes.   Do not shave 48 hours prior to surgery.   Contacts and dentures may not be worn into surgery.  Do not bring valuables to the hospital, including drivers license, insurance or credit cards.  [facility] is not responsible for any belongings or valuables.   TAKE THESE MEDICATIONS THE MORNING OF SURGERY:  1.  Bupropion 2.  Pepcid  (take one the night before and one on the morning of surgery - helps to  prevent nausea after surgery.)  Use CHG Soap as directed on instruction sheet.  NOW!  Stop Anti-inflammatories (NSAIDS) such as Advil, Aleve, Ibuprofen, Motrin, Naproxen, Naprosyn and Aspirin based products such as Excedrin, Goodys Powder, BC Powder. (May take Tylenol or Acetaminophen if needed.)  NOW!  Stop ANY OVER THE COUNTER supplements until after surgery. (May continue Vitamin D, Vitamin B, and multivitamin.)  Wear comfortable clothing (specific to your surgery type) to the hospital.  Plan for stool softeners for home use.  If you are being discharged the day of surgery, you will not be allowed to drive home. You will need a responsible adult to drive you home and stay with you that night.   If you are taking public transportation, you will need to have a responsible adult with you. Please confirm with your physician that it is acceptable to use public transportation.   Please call [phone] if you have any questions about these instructions.

## 2019-05-29 ENCOUNTER — Encounter: Payer: Self-pay | Admitting: Anesthesiology

## 2019-05-29 LAB — NOVEL CORONAVIRUS, NAA (HOSP ORDER, SEND-OUT TO REF LAB; TAT 18-24 HRS): SARS-CoV-2, NAA: NOT DETECTED

## 2019-06-01 ENCOUNTER — Ambulatory Visit: Payer: BC Managed Care – PPO | Admitting: Anesthesiology

## 2019-06-01 ENCOUNTER — Other Ambulatory Visit: Payer: Self-pay

## 2019-06-01 ENCOUNTER — Ambulatory Visit
Admission: RE | Admit: 2019-06-01 | Discharge: 2019-06-01 | Disposition: A | Discharge status: Home / Self Care | Payer: BC Managed Care – PPO | Attending: General Surgery | Admitting: General Surgery

## 2019-06-01 ENCOUNTER — Encounter
Admission: RE | Disposition: A | Discharge status: Home / Self Care | Payer: Self-pay | Source: Home / Self Care | Attending: General Surgery

## 2019-06-01 DIAGNOSIS — Z6820 Body mass index (BMI) 20.0-20.9, adult: Secondary | ICD-10-CM | POA: Diagnosis not present

## 2019-06-01 DIAGNOSIS — Z881 Allergy status to other antibiotic agents status: Secondary | ICD-10-CM | POA: Diagnosis not present

## 2019-06-01 DIAGNOSIS — K430 Incisional hernia with obstruction, without gangrene: Secondary | ICD-10-CM | POA: Insufficient documentation

## 2019-06-01 DIAGNOSIS — K432 Incisional hernia without obstruction or gangrene: Secondary | ICD-10-CM | POA: Diagnosis present

## 2019-06-01 DIAGNOSIS — K219 Gastro-esophageal reflux disease without esophagitis: Secondary | ICD-10-CM | POA: Diagnosis not present

## 2019-06-01 DIAGNOSIS — I10 Essential (primary) hypertension: Secondary | ICD-10-CM | POA: Insufficient documentation

## 2019-06-01 DIAGNOSIS — G8929 Other chronic pain: Secondary | ICD-10-CM | POA: Insufficient documentation

## 2019-06-01 DIAGNOSIS — K420 Umbilical hernia with obstruction, without gangrene: Secondary | ICD-10-CM | POA: Diagnosis not present

## 2019-06-01 DIAGNOSIS — Z79899 Other long term (current) drug therapy: Secondary | ICD-10-CM | POA: Insufficient documentation

## 2019-06-01 DIAGNOSIS — Z8249 Family history of ischemic heart disease and other diseases of the circulatory system: Secondary | ICD-10-CM | POA: Insufficient documentation

## 2019-06-01 DIAGNOSIS — E782 Mixed hyperlipidemia: Secondary | ICD-10-CM | POA: Insufficient documentation

## 2019-06-01 DIAGNOSIS — Z888 Allergy status to other drugs, medicaments and biological substances status: Secondary | ICD-10-CM | POA: Insufficient documentation

## 2019-06-01 DIAGNOSIS — Z87442 Personal history of urinary calculi: Secondary | ICD-10-CM | POA: Insufficient documentation

## 2019-06-01 DIAGNOSIS — F329 Major depressive disorder, single episode, unspecified: Secondary | ICD-10-CM | POA: Insufficient documentation

## 2019-06-01 DIAGNOSIS — M549 Dorsalgia, unspecified: Secondary | ICD-10-CM | POA: Diagnosis not present

## 2019-06-01 DIAGNOSIS — G2581 Restless legs syndrome: Secondary | ICD-10-CM | POA: Insufficient documentation

## 2019-06-01 HISTORY — PX: INCISIONAL HERNIA REPAIR: SHX193

## 2019-06-01 SURGERY — REPAIR, HERNIA, INCISIONAL, LAPAROSCOPIC
Anesthesia: General

## 2019-06-01 MED ORDER — SODIUM CHLORIDE 0.9 % IV SOLN
INTRAVENOUS | Status: DC | PRN
  Administered 2019-06-01: 30 ug/min via INTRAVENOUS

## 2019-06-01 MED ORDER — SUGAMMADEX SODIUM 500 MG/5ML IV SOLN
INTRAVENOUS | Status: DC | PRN
  Administered 2019-06-01: 420.8 mg via INTRAVENOUS

## 2019-06-01 MED ORDER — FENTANYL CITRATE (PF) 100 MCG/2ML IJ SOLN
INTRAMUSCULAR | Status: DC | PRN
  Administered 2019-06-01: 100 ug via INTRAVENOUS

## 2019-06-01 MED ORDER — CELECOXIB 200 MG PO CAPS
ORAL_CAPSULE | ORAL | Status: AC
  Administered 2019-06-01: 06:00:00 200 mg via ORAL
  Filled 2019-06-01: qty 1

## 2019-06-01 MED ORDER — HYDROCODONE-ACETAMINOPHEN 5-325 MG PO TABS
1.0000 | ORAL_TABLET | Freq: Once | ORAL | Status: AC
  Administered 2019-06-01: 1 via ORAL

## 2019-06-01 MED ORDER — CLINDAMYCIN PHOSPHATE 900 MG/50ML IV SOLN
INTRAVENOUS | Status: AC
  Filled 2019-06-01: qty 50

## 2019-06-01 MED ORDER — DEXAMETHASONE SODIUM PHOSPHATE 10 MG/ML IJ SOLN
INTRAMUSCULAR | Status: AC
  Filled 2019-06-01: qty 1

## 2019-06-01 MED ORDER — GABAPENTIN 300 MG PO CAPS
ORAL_CAPSULE | ORAL | Status: AC
  Administered 2019-06-01: 300 mg via ORAL
  Filled 2019-06-01: qty 1

## 2019-06-01 MED ORDER — FENTANYL CITRATE (PF) 250 MCG/5ML IJ SOLN
INTRAMUSCULAR | Status: AC
  Filled 2019-06-01: qty 5

## 2019-06-01 MED ORDER — LIDOCAINE HCL (PF) 2 % IJ SOLN
INTRAMUSCULAR | Status: AC
  Filled 2019-06-01: qty 10

## 2019-06-01 MED ORDER — ROCURONIUM BROMIDE 100 MG/10ML IV SOLN
INTRAVENOUS | Status: DC | PRN
  Administered 2019-06-01: 10 mg via INTRAVENOUS
  Administered 2019-06-01: 50 mg via INTRAVENOUS

## 2019-06-01 MED ORDER — DEXAMETHASONE SODIUM PHOSPHATE 10 MG/ML IJ SOLN
INTRAMUSCULAR | Status: DC | PRN
  Administered 2019-06-01: 10 mg via INTRAVENOUS

## 2019-06-01 MED ORDER — HYDROCODONE-ACETAMINOPHEN 5-325 MG PO TABS: 1.0000 | ORAL_TABLET | ORAL | 0 refills | Status: AC | PRN

## 2019-06-01 MED ORDER — FENTANYL CITRATE (PF) 100 MCG/2ML IJ SOLN
INTRAMUSCULAR | Status: AC
  Filled 2019-06-01: qty 2

## 2019-06-01 MED ORDER — BUPIVACAINE LIPOSOME 1.3 % IJ SUSP: 20.0000 mL | Freq: Once | INTRAMUSCULAR | Status: DC

## 2019-06-01 MED ORDER — SUCCINYLCHOLINE CHLORIDE 20 MG/ML IJ SOLN
INTRAMUSCULAR | Status: AC
  Filled 2019-06-01: qty 1

## 2019-06-01 MED ORDER — LACTATED RINGERS IV SOLN
INTRAVENOUS | Status: DC
  Administered 2019-06-01 (×2): via INTRAVENOUS

## 2019-06-01 MED ORDER — ROCURONIUM BROMIDE 50 MG/5ML IV SOLN
INTRAVENOUS | Status: AC
  Filled 2019-06-01: qty 1

## 2019-06-01 MED ORDER — MIDAZOLAM HCL 2 MG/2ML IJ SOLN
INTRAMUSCULAR | Status: DC | PRN
  Administered 2019-06-01: 2 mg via INTRAVENOUS

## 2019-06-01 MED ORDER — CLINDAMYCIN PHOSPHATE 900 MG/50ML IV SOLN
900.0000 mg | INTRAVENOUS | Status: AC
  Administered 2019-06-01: 900 mg via INTRAVENOUS

## 2019-06-01 MED ORDER — PROPOFOL 10 MG/ML IV BOLUS
INTRAVENOUS | Status: AC
  Filled 2019-06-01: qty 40

## 2019-06-01 MED ORDER — ONDANSETRON HCL 4 MG/2ML IJ SOLN
INTRAMUSCULAR | Status: AC
  Filled 2019-06-01: qty 2

## 2019-06-01 MED ORDER — BUPIVACAINE-EPINEPHRINE 0.5% -1:200000 IJ SOLN
INTRAMUSCULAR | Status: DC | PRN
  Administered 2019-06-01: 5 mL
  Administered 2019-06-01: 10 mL

## 2019-06-01 MED ORDER — CELECOXIB 200 MG PO CAPS
200.0000 mg | ORAL_CAPSULE | ORAL | Status: AC
  Administered 2019-06-01: 06:00:00 200 mg via ORAL

## 2019-06-01 MED ORDER — FENTANYL CITRATE (PF) 100 MCG/2ML IJ SOLN
25.0000 ug | INTRAMUSCULAR | Status: DC | PRN
  Administered 2019-06-01 (×2): 25 ug via INTRAVENOUS

## 2019-06-01 MED ORDER — ACETAMINOPHEN 10 MG/ML IV SOLN
INTRAVENOUS | Status: AC
  Filled 2019-06-01: qty 100

## 2019-06-01 MED ORDER — SUGAMMADEX SODIUM 500 MG/5ML IV SOLN
INTRAVENOUS | Status: AC
  Filled 2019-06-01: qty 5

## 2019-06-01 MED ORDER — HYDROCODONE-ACETAMINOPHEN 5-325 MG PO TABS
ORAL_TABLET | ORAL | Status: AC
  Administered 2019-06-01: 1 via ORAL
  Filled 2019-06-01: qty 1

## 2019-06-01 MED ORDER — PROPOFOL 10 MG/ML IV BOLUS
INTRAVENOUS | Status: DC | PRN
  Administered 2019-06-01: 200 mg via INTRAVENOUS

## 2019-06-01 MED ORDER — PHENYLEPHRINE HCL (PRESSORS) 10 MG/ML IV SOLN
INTRAVENOUS | Status: AC
  Filled 2019-06-01: qty 1

## 2019-06-01 MED ORDER — ONDANSETRON HCL 4 MG/2ML IJ SOLN
INTRAMUSCULAR | Status: DC | PRN
  Administered 2019-06-01: 4 mg via INTRAVENOUS

## 2019-06-01 MED ORDER — GABAPENTIN 300 MG PO CAPS
300.0000 mg | ORAL_CAPSULE | ORAL | Status: AC
  Administered 2019-06-01: 06:00:00 300 mg via ORAL

## 2019-06-01 MED ORDER — ONDANSETRON HCL 4 MG/2ML IJ SOLN: 4.0000 mg | Freq: Once | INTRAMUSCULAR | Status: DC | PRN

## 2019-06-01 MED ORDER — LIDOCAINE HCL (CARDIAC) PF 100 MG/5ML IV SOSY
PREFILLED_SYRINGE | INTRAVENOUS | Status: DC | PRN
  Administered 2019-06-01: 100 mg via INTRAVENOUS

## 2019-06-01 MED ORDER — SUCCINYLCHOLINE CHLORIDE 20 MG/ML IJ SOLN
INTRAMUSCULAR | Status: DC | PRN
  Administered 2019-06-01: 80 mg via INTRAVENOUS

## 2019-06-01 MED ORDER — ACETAMINOPHEN 10 MG/ML IV SOLN
INTRAVENOUS | Status: DC | PRN
  Administered 2019-06-01: 1000 mg via INTRAVENOUS

## 2019-06-01 MED ORDER — PHENYLEPHRINE HCL (PRESSORS) 10 MG/ML IV SOLN
INTRAVENOUS | Status: DC | PRN
  Administered 2019-06-01: 200 ug via INTRAVENOUS
  Administered 2019-06-01 (×3): 100 ug via INTRAVENOUS
  Administered 2019-06-01: 200 ug via INTRAVENOUS

## 2019-06-01 MED ORDER — MIDAZOLAM HCL 2 MG/2ML IJ SOLN
INTRAMUSCULAR | Status: AC
  Filled 2019-06-01: qty 2

## 2019-06-01 SURGICAL SUPPLY — 45 items
APL PRP STRL LF DISP 70% ISPRP (MISCELLANEOUS) ×1
CANISTER SUCT 1200ML W/VALVE (MISCELLANEOUS) ×3 IMPLANT
CHLORAPREP W/TINT 26 (MISCELLANEOUS) ×3 IMPLANT
COVER WAND RF STERILE (DRAPES) ×3 IMPLANT
DEVICE SECURE STRAP 25 ABSORB (INSTRUMENTS) ×5 IMPLANT
ELECT REM PT RETURN 9FT ADLT (ELECTROSURGICAL) ×3
ELECTRODE REM PT RTRN 9FT ADLT (ELECTROSURGICAL) ×1 IMPLANT
GAUZE SPONGE 4X4 12PLY STRL (GAUZE/BANDAGES/DRESSINGS) ×1 IMPLANT
GLOVE BIO SURGEON STRL SZ 6.5 (GLOVE) ×2 IMPLANT
GLOVE BIO SURGEONS STRL SZ 6.5 (GLOVE) ×1
GOWN STRL REUS W/ TWL LRG LVL3 (GOWN DISPOSABLE) ×2 IMPLANT
GOWN STRL REUS W/TWL LRG LVL3 (GOWN DISPOSABLE) ×6
GRASPER SUT TROCAR 14GX15 (MISCELLANEOUS) ×2 IMPLANT
IRRIGATION STRYKERFLOW (MISCELLANEOUS) ×1 IMPLANT
IRRIGATOR STRYKERFLOW (MISCELLANEOUS) ×3
IV NS 1000ML (IV SOLUTION) ×3
IV NS 1000ML BAXH (IV SOLUTION) ×1 IMPLANT
KIT TURNOVER KIT A (KITS) ×3 IMPLANT
LABEL OR SOLS (LABEL) ×3 IMPLANT
MESH VENT LT ST 10.2X15.2CM EL (Mesh General) ×2 IMPLANT
NDL FILTER BLUNT 18X1 1/2 (NEEDLE) ×1 IMPLANT
NEEDLE FILTER BLUNT 18X 1/2SAF (NEEDLE)
NEEDLE FILTER BLUNT 18X1 1/2 (NEEDLE) IMPLANT
NEEDLE VERESS 14GA 120MM (NEEDLE) ×3 IMPLANT
NS IRRIG 500ML POUR BTL (IV SOLUTION) ×3 IMPLANT
PACK LAP CHOLECYSTECTOMY (MISCELLANEOUS) ×3 IMPLANT
SCISSORS METZENBAUM CVD 33 (INSTRUMENTS) ×1 IMPLANT
SEAL FOR SCOPE WARMER C3101 (MISCELLANEOUS) ×1 IMPLANT
SET TUBE SMOKE EVAC HIGH FLOW (TUBING) ×3 IMPLANT
SHEARS HARMONIC ACE PLUS 36CM (ENDOMECHANICALS) ×2 IMPLANT
SLEEVE ENDOPATH XCEL 5M (ENDOMECHANICALS) ×3 IMPLANT
SUT ETHILON 5-0 FS-2 18 BLK (SUTURE) ×2 IMPLANT
SUT MNCRL 4-0 (SUTURE) ×3
SUT MNCRL 4-0 27XMFL (SUTURE) ×1
SUT PDS AB 0 CT1 27 (SUTURE) ×2 IMPLANT
SUT VIC AB 0 CT1 36 (SUTURE) ×4 IMPLANT
SUT VIC AB 2-0 CT2 27 (SUTURE) ×1 IMPLANT
SUTURE MNCRL 4-0 27XMF (SUTURE) IMPLANT
SYR 5ML LL (SYRINGE) ×3 IMPLANT
TACKER 5MM HERNIA 3.5CML NAB (ENDOMECHANICALS) ×1 IMPLANT
TRAY FOLEY MTR SLVR 16FR STAT (SET/KITS/TRAYS/PACK) ×3 IMPLANT
TROCAR XCEL 12X100 BLDLESS (ENDOMECHANICALS) ×2 IMPLANT
TROCAR XCEL NON-BLD 11X100MML (ENDOMECHANICALS) ×3 IMPLANT
TROCAR XCEL NON-BLD 5MMX100MML (ENDOMECHANICALS) ×5 IMPLANT
WATER STERILE IRR 1000ML POUR (IV SOLUTION) ×1 IMPLANT

## 2019-06-01 NOTE — Discharge Instructions (Signed)
  Diet: Resume home heart healthy regular diet.   Activity: No heavy lifting >20 pounds (children, pets, laundry, garbage) or strenuous activity until follow-up, but light activity and walking are encouraged. Do not drive or drink alcohol if taking narcotic pain medications.  Wound care: May shower with soapy water and pat dry (do not rub incisions), but no baths or submerging incision underwater until follow-up. (no swimming)   Medications: Resume all home medications. For mild to moderate pain: acetaminophen (Tylenol) or ibuprofen (if no kidney disease). Combining Tylenol with alcohol can substantially increase your risk of causing liver disease. Narcotic pain medications, if prescribed, can be used for severe pain, though may cause nausea, constipation, and drowsiness. Do not combine Tylenol and Norco within a 6 hour period as Norco contains Tylenol. If you do not need the narcotic pain medication, you do not need to fill the prescription.  Call office ([phone]) at any time if any questions, worsening pain, fevers/chills, bleeding, drainage from incision site, or other concerns.   AMBULATORY SURGERY  DISCHARGE INSTRUCTIONS   1) The drugs that you were given will stay in your system until tomorrow so for the next 24 hours you should not:  A) Drive an automobile B) Make any legal decisions C) Drink any alcoholic beverage   2) You may resume regular meals tomorrow.  Today it is better to start with liquids and gradually work up to solid foods.  You may eat anything you prefer, but it is better to start with liquids, then soup and crackers, and gradually work up to solid foods.   3) Please notify your doctor immediately if you have any unusual bleeding, trouble breathing, redness and pain at the surgery site, drainage, fever, or pain not relieved by medication.    4) Additional Instructions:        Please contact your physician with any problems or Same Day Surgery at  [phone], Monday through Friday 6 am to 4 pm, or [facility] Main number at [phone]. 

## 2019-06-01 NOTE — Op Note (Signed)
Preoperative diagnosis: Ventral (incisional) hernia x 2.  Postoperative diagnosis: Ventral (incisional) hernia x 2.  Procedure: Laparoscopic Ventral Hernia Repair with mesh  Anesthesia: GETA  Surgeon: Dr. Hazle Quantintron Diaz  Wound Classification: Clean  Indications: Patient is a 58 y.o. female developed a ventral hernia in the site of a previous incision. This was symptomatic and incarcerated and repair was indicated. Laparoscopic approach was elected.   Findings: 1. Two infra umbilical incisional hernia 1.5 cm and 2.5 cm incisional hernias. Also a 1 cm umbilical hernia identified. Incarcerated with omental fat.   2. A 15 cm x 10 cm Ventralight Echo mesh used for repair 3. No hollow viscus organ injury identified during procedure 4. Tension free repair achieved 5. Adequate hemostasis  Description of procedure: The patient was brought into the operating room and placed on the table in the supine position. General anesthesia was administered. A time-out was completed verifying correct patient, procedure, site, positioning, and implant(s) and/ or special equipment prior to beginning this procedure. All bony prominences were padded. Both arms were tucked. A Foley was placed. An orogastric tube was placed. The abdomen was prepped from the nipples to proximal thighs and table to table with Chlorhexidine. The patient was draped in the usual sterile fashion.  The Veress needle inserted in the inferior portion of the left costal margin (Left upper quadrant). Proper position was confirmed by aspiration and saline meniscus test. Carbon dioxide was started on low flow. Once it was flowing easily, it was advanced to high flow.  The abdomen was insufflated with carbon dioxide to a pressure of 15 mmHg. The patient tolerated insufflation well. A 5 mm trocar was inserted in the left upper quadrant. The laparoscope was inserted and the abdomen inspected. Under direct visualization an additional 5-mm trocar was placed  in the left lower quadrant. A 5-mm trocar was placed between these two ports. Care was taken to avoid injury to the bladder or inferior epigastric vessels.  Upon examining the abdominal wall, the hernia appeared to be infra umbilical as described above.  With the grasper and Harmonic scalpel, the omental fat that was incarcerated in both infraumbilical hernias were reduced.  Tissue from the omental fat looks healthy.  The hernia sac was dissected around the fascia to safely get clean healthy anterior wall fascia.  A 12 mm trocar was inserted in the infra umbilical area to go through the 1.5 cm hernia defect.  Extracorporeally, the mesh was oriented and the corners were marked with a marking pen. The top and bottom center of the mesh was marked with 0 PDS suture to serve as two transfascial anchoring sutures. The mesh was rolled into a tight cigar-like configuration and was introduced through the 12-mm port. The mesh was placed within the peritoneal cavity without any complications. Then, using the proper orientation and the marks placed initially, the mesh was unraveled using graspers. The proper orientation of the mesh was maintained throughout the entire procedure, with the nonadherent hydrocellulose-coated side of the mesh facing the bowel. After proper positioning of the mesh, a suture passer was used to pull the transfascial sutures out corresponding stab incisions. The suture was adjusted to provide adequate tension on the mesh. This resulted in the placement of the mesh inside the abdomen covering the hernia defects with an adequate 5 cm overlap along the margins.  The 12 mm trocar was removed. Using a suture passer Vicryl 0 sutures were passed. Multiple  sutures were placed approximately 1 cm apart in a figure  of eight fashion being able to close the defect.   A Ventralight which measured 15 cm  10 cm was used for repair of this ventral hernia. After the transfascial sutures were tied down, the mesh was  tacked using a AbsorbaTack. The tacks were placed in a circumferential fashion approximately 0.5 cm from the edge and approximately 1 cm from each other. Crowning was done with a second row of tacks circumferentially, especially in the lower part of the mesh so as to prevent mesh migration and hernia recurrence. The abdominal cavity was inspected and the hernia repair appeared satisfactory. The trocars were removed under direct vision. No bleeding was noted. The laparoscope was withdrawn and the final trocar removed. The abdomen was allowed to collapse.   The skin was closed with 4-0 Monocryl. Dermabon was applied. The patient tolerated the procedure well and was brought to the PACU in stable condition.   Specimen: Hernia sac  Complications: None  Estimated Blood Loss: 10 mL

## 2019-06-01 NOTE — Anesthesia Procedure Notes (Signed)
Procedure Name: Intubation Date/Time: 06/01/2019 7:36 AM Performed by: Lavone Orn, CRNA Pre-anesthesia Checklist: Patient identified, Emergency Drugs available, Suction available, Patient being monitored and Timeout performed Patient Re-evaluated:Patient Re-evaluated prior to induction Oxygen Delivery Method: Circle system utilized Preoxygenation: Pre-oxygenation with 100% oxygen Induction Type: IV induction Ventilation: Mask ventilation without difficulty Laryngoscope Size: Mac and 4 Grade View: Grade I Tube type: Oral Tube size: 7.0 mm Number of attempts: 1 Airway Equipment and Method: Stylet Placement Confirmation: ETT inserted through vocal cords under direct vision,  positive ETCO2 and breath sounds checked- equal and bilateral Secured at: 22 cm Tube secured with: Tape Dental Injury: Teeth and Oropharynx as per pre-operative assessment

## 2019-06-01 NOTE — Anesthesia Postprocedure Evaluation (Signed)
Anesthesia Post Note  Patient: Brittany Cain  Procedure(s) Performed: LAPAROSCOPIC INCISIONAL HERNIA REPAIR WITH MESH (N/A )  Patient location during evaluation: PACU Anesthesia Type: General Level of consciousness: awake and alert Pain management: pain level controlled Vital Signs Assessment: post-procedure vital signs reviewed and stable Respiratory status: spontaneous breathing, nonlabored ventilation, respiratory function stable and patient connected to nasal cannula oxygen Cardiovascular status: blood pressure returned to baseline and stable Postop Assessment: no apparent nausea or vomiting Anesthetic complications: no     Last Vitals:  Vitals:   06/01/19 1045 06/01/19 1238  BP: 139/80 135/87  Pulse: 77 70  Resp: 16 16  Temp: (!) 36.3 C   SpO2: 97% 98%    Last Pain:  Vitals:   06/01/19 1238  TempSrc:   PainSc: 2                  Slaton Reaser S

## 2019-06-01 NOTE — H&P (Signed)
PATIENT PROFILE: Brittany Cain is a 58 y.o. female who presents to the Clinic for consultation at the request of Brittany Cain for evaluation of ventral hernia.  PCP: Brittany Body, MD  HISTORY OF PRESENT ILLNESS: Brittany Cain reports she has been having pain on the abdomen since few months ago. She reports that the pain is on the midline below her bellybutton and radiates caudally and to the right lower quadrant. She reports that pain is aggravated by applying pressure on the area and after a long day of walking and working. She is currently taking care of her 4 grandchildren. She denies any nausea or vomiting. Alleviating factor is resting and trying to reduce it but it does not reduce completely. She does take pain medication for her back pain that sometimes also help with the abdominal pain. Pain is intermittent but when it comes it is really significant. Patient has history of hysterectomy and C-section with a infraumbilical midline scar and a Pfannenstiel scar.   Patient today without any changes on the physical exam since last visit. Patient denies any changes of acute abdominal pain. There has not been abdominal distention, nausea or vomiting. There has been no increase in hernia size since last visit. Denies fever or chills. Patient having regular bowel movements.   PROBLEM LIST: Problem List Date Reviewed: 04/20/2019  Noted  Ventral hernia without obstruction or gangrene 04/20/2019  Mixed hyperlipidemia (Trig 182, LDL 160 - 12/05/18) 02/15/2016  Moderate episode of recurrent major depressive disorder (CMS-HCC) Unknown  Gastroesophageal reflux disease without esophagitis Unknown  Essential hypertension Unknown  Restless leg syndrome Unknown  Chronic back pain Unknown  Overview  S/P surgery 2007 per Dr. Trenton Gammon- Neurosurgery, [place]   Morbid obesity due to excess calories (CMS-HCC) Unknown    GENERAL REVIEW OF SYSTEMS:   General ROS: negative for - chills, fatigue, fever, weight  gain or weight loss Allergy and Immunology ROS: negative for - hives  Hematological and Lymphatic ROS: negative for - bleeding problems or bruising, negative for palpable nodes Endocrine ROS: negative for - heat or cold intolerance, hair changes Respiratory ROS: negative for - cough, shortness of breath or wheezing Cardiovascular ROS: no chest pain or palpitations GI ROS: negative for nausea, vomiting, diarrhea, positive for constipation and heartburn. Positive for abdominal pain Musculoskeletal ROS: negative for - joint swelling or muscle pain Neurological ROS: negative for - confusion, syncope. Positive for headache Dermatological ROS: negative for pruritus and rash Psychiatric: Positive for anxiety, depression, difficulty sleeping and memory loss  MEDICATIONS: Current Outpatient Medications  Medication Sig Dispense Refill  . acetaminophen (TYLENOL) 325 MG tablet Take 650 mg by mouth as needed for Pain.  Marland Kitchen atorvastatin (LIPITOR) 40 MG tablet TAKE 1 TABLET BY MOUTH EVERY DAY 90 tablet 1  . buPROPion (WELLBUTRIN SR) 200 MG SR tablet TAKE 1 TABLET BY MOUTH TWICE A DAY 180 tablet 1  . cranberry extract (THERACRAN) 650 mg Cap Take 1 capsule by mouth once daily  . cyanocobalamin (VITAMIN B12) 1000 MCG tablet Take 1,000 mcg by mouth once daily.  . famotidine (PEPCID) 20 MG tablet Take 1 tablet (20 mg total) by mouth 2 (two) times daily 60 tablet 3  . loratadine (CLARITIN) 10 mg tablet Take 10 mg by mouth once daily.  Marland Kitchen losartan (COZAAR) 100 MG tablet TAKE 1 TABLET (100 MG TOTAL) BY MOUTH ONCE DAILY. 90 tablet 1  . omeprazole (PRILOSEC OTC) 20 MG EC tablet Take 1 tablet (20 mg total) by mouth once daily 30 tablet  3  . pyridoxine, vitamin B6, (VITAMIN B-6) 50 MG tablet Take 50 mg by mouth 2 (two) times daily.  Marland Kitchen rOPINIRole (REQUIP) 0.25 MG tablet TAKE 1 TABLET (0.25 MG TOTAL) BY MOUTH NIGHTLY. 90 tablet 1   No current facility-administered medications for this visit.   ALLERGIES: Aciphex  [rabeprazole]; Bactrim [sulfamethoxazole-trimethoprim]; Ceftin [cefuroxime axetil]; Pantoprazole; Viibryd [vilazodone]; and Vilazodone hcl  PAST MEDICAL HISTORY: Past Medical History:  Diagnosis Date  . Anxiety  . Chronic back pain  S/P surgery 2007 per Dr. Trenton Gammon- Neurosurgery, [place]  . Depression  . GERD (gastroesophageal reflux disease)  . History of chicken pox  . History of kidney stones  . Hyperlipidemia  . Hypertension  . Obesity  . Postmenopausal  Mild menopausal syndrome- previously on HRT  . Restless leg syndrome   PAST SURGICAL HISTORY: Past Surgical History:  Procedure Laterality Date  . CESAREAN SECTION  . CHOLECYSTECTOMY  . EGD 05/24/2017  Chronic gastritis/Erosive esophagitis/Repeat 8 wks/MUS  . HYSTERECTOMY  . Lower back surgery    FAMILY HISTORY: Family History  Problem Relation Age of Onset  . Stroke Mother  3 or more  . Prostate cancer Father  . Coronary Artery Disease (Blocked arteries around heart) Sister    SOCIAL HISTORY: Social History   Socioeconomic History  . Marital status: Married  Spouse name: Not on file  . Number of children: Not on file  . Years of education: Not on file  . Highest education level: Not on file  Occupational History  . Not on file  Social Needs  . Financial resource strain: Not on file  . Food insecurity:  Worry: Not on file  Inability: Not on file  . Transportation needs:  Medical: Not on file  Non-medical: Not on file  Tobacco Use  . Smoking status: Never Smoker  . Smokeless tobacco: Never Used  Substance and Sexual Activity  . Alcohol use: No  Alcohol/week: 0.0 standard drinks  . Drug use: No  . Sexual activity: Never  Other Topics Concern  . 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 Weight: (!) 104.3 kg (229 lb 15 oz)   GENERAL: Alert, active, oriented x3  HEENT: Pupils equal reactive to light. Extraocular movements are intact. Sclera clear. Palpebral conjunctiva normal red color.Pharynx clear.  NECK: Supple with no palpable mass and no adenopathy.  LUNGS: Sound clear with no rales rhonchi or wheezes.  HEART: Regular rhythm S1 and S2 without murmur.  ABDOMEN: Soft and depressible, nontender with no palpable mass, no hepatomegaly. There is a medium sized nonreducible incisional hernia below her navel. Mildly tender to palpation. Unable to be reduced. No skin changes around. No peritoneal signs.  EXTREMITIES: Well-developed well-nourished symmetrical with no dependent edema.  NEUROLOGICAL: Awake alert oriented, facial expression symmetrical, moving all extremities.  REVIEW OF DATA: I have reviewed the following data today: No visits with results within 3 Month(s) from this visit.  Latest known visit with results is:  Appointment on 12/05/2018  Component Date Value  . Glucose 12/05/2018 88  . Sodium 12/05/2018 139  . Potassium 12/05/2018 4.2  . Chloride 12/05/2018 104  . Carbon Dioxide (CO2) 12/05/2018 29.2  . Urea Nitrogen (BUN) 12/05/2018 12  . Creatinine 12/05/2018 0.8  .  Glomerular Filtration Ra* 12/05/2018 74  . Calcium 12/05/2018 9.2  . AST 12/05/2018 26  . ALT 12/05/2018 45*  . Alk Phos (alkaline Phosp* 12/05/2018 67  . Albumin 12/05/2018 4.1  . Bilirubin, Total 12/05/2018 1.0  . Protein, Total 12/05/2018 6.3  . A/G Ratio 12/05/2018 1.9  . Cholesterol, Total 12/05/2018 231*  . Triglyceride 12/05/2018 182  . HDL (High Density Lipopr* 12/05/2018 34.9*  . LDL Calculated 12/05/2018 160*  . VLDL Cholesterol 12/05/2018 36  . Cholesterol/HDL Ratio 12/05/2018 6.6  . Hemoglobin A1C 12/05/2018 5.6  . Average Blood Glucose (C* 12/05/2018 114    ASSESSMENT: Ms. Nuckles is a 58 y.o. female presenting for  consultation for ventral hernia evaluation.  Patient with an incisional hernia from the infraumbilical midline incision. She is having significant remittent pain. She was oriented about the diagnosis of incisional hernia. She actually has 3 hernias close one to each other. One is an umbilical hernia, and other small incisional hernia and a lower moderate size incisional hernia. 2 of them has chronic fat content material incarcerated. No sign of strangulation. There is no bowel getting to the hernia. She also oriented about the surgical techniques including laparoscopic versus open hernia repair. She was also oriented about the use of mesh to reinforce the repair. The benefit of the mesh is to decrease the risk of recurrence. Patient was oriented about the risk of surgery including infection, bleeding, injury to small bowel, bladder and other intra-abdominal structures. Also discussed about complications such as small bowel obstruction, form of adhesions, intestinal fistulas, chronic pain. Also discussed about the possibility of needing a second surgery for removal of the mesh if there is any complication with infection. Patient also with obesity, but due to the pain that she is having I consider that doing the surgery at this moment will be of benefit instead of waiting for her to lose weight. This will make the surgery more difficult and with slight increased risk of recurrence. She also has other communicability such as hypertension and hyperlipidemia. Patient voiced that she understood the benefit and risks of the surgery. Due to the amount of pain that she is having intermittently I consider that surgical management of this hernia is of benefit for the patient. I think that we can wait until elective surgery are resumed. I discussed with her that we are currently discussing the protocol for elective cases which may include needing to be tested for COVID-19. We will call her with the surgical date and further  instructions before surgery.  Incisional hernia, without obstruction or gangrene [K43.2]  PLAN: 1. Laparoscopic incisional hernia repair with mesh ([id number]) 2. CBC, CMP 3. Avoid taking aspirin 5 days before surgery.   Patient verbalized understanding, all questions were answered, and were agreeable with the plan outlined above.   Herbert Pun, MD  Electronically signed by Herbert Pun, MD

## 2019-06-01 NOTE — Transfer of Care (Signed)
Immediate Anesthesia Transfer of Care Note  Patient: Brittany Cain  Procedure(s) Performed: LAPAROSCOPIC INCISIONAL HERNIA REPAIR WITH MESH (N/A )  Patient Location: PACU  Anesthesia Type:General  Level of Consciousness: drowsy  Airway & Oxygen Therapy: Patient Spontanous Breathing and Patient connected to face mask oxygen  Post-op Assessment: Report given to RN and Post -op Vital signs reviewed and stable  Post vital signs: stable  Last Vitals:  Vitals Value Taken Time  BP 146/73 06/01/19 0933  Temp    Pulse 81 06/01/19 0936  Resp 18 06/01/19 0936  SpO2 100 % 06/01/19 0936  Vitals shown include unvalidated device data.  Last Pain:  Vitals:   06/01/19 0618  TempSrc: Tympanic  PainSc: 0-No pain         Complications: No apparent anesthesia complications

## 2019-06-01 NOTE — Anesthesia Preprocedure Evaluation (Signed)
Anesthesia Evaluation  Patient identified by MRN, date of birth, ID band Patient awake    Reviewed: Allergy & Precautions, NPO status , Patient's Chart, lab work & pertinent test results, reviewed documented beta blocker date and time   Airway Mallampati: III  TM Distance: >3 FB     Dental  (+) Chipped   Pulmonary           Cardiovascular hypertension, Pt. on medications      Neuro/Psych PSYCHIATRIC DISORDERS Anxiety Depression    GI/Hepatic GERD  Controlled,  Endo/Other    Renal/GU      Musculoskeletal   Abdominal   Peds  Hematology   Anesthesia Other Findings Obese. EKG ok.  Reproductive/Obstetrics                             Anesthesia Physical Anesthesia Plan  ASA: III  Anesthesia Plan: General   Post-op Pain Management:    Induction: Intravenous  PONV Risk Score and Plan:   Airway Management Planned: Oral ETT  Additional Equipment:   Intra-op Plan:   Post-operative Plan:   Informed Consent: I have reviewed the patients History and Physical, chart, labs and discussed the procedure including the risks, benefits and alternatives for the proposed anesthesia with the patient or authorized representative who has indicated his/her understanding and acceptance.       Plan Discussed with: CRNA  Anesthesia Plan Comments:         Anesthesia Quick Evaluation

## 2019-06-01 NOTE — Anesthesia Post-op Follow-up Note (Signed)
Anesthesia QCDR form completed.        

## 2019-06-02 ENCOUNTER — Encounter: Payer: Self-pay | Admitting: General Surgery

## 2019-06-02 LAB — SURGICAL PATHOLOGY

## 2019-07-27 ENCOUNTER — Emergency Department: Payer: BC Managed Care – PPO

## 2019-07-27 ENCOUNTER — Other Ambulatory Visit: Payer: Self-pay

## 2019-07-27 ENCOUNTER — Emergency Department
Admission: EM | Admit: 2019-07-27 | Discharge: 2019-07-27 | Disposition: A | Discharge status: Home / Self Care | Payer: BC Managed Care – PPO | Attending: Student in an Organized Health Care Education/Training Program | Admitting: Student in an Organized Health Care Education/Training Program

## 2019-07-27 ENCOUNTER — Encounter: Payer: Self-pay | Admitting: Emergency Medicine

## 2019-07-27 DIAGNOSIS — Z79899 Other long term (current) drug therapy: Secondary | ICD-10-CM | POA: Diagnosis not present

## 2019-07-27 DIAGNOSIS — I10 Essential (primary) hypertension: Secondary | ICD-10-CM | POA: Diagnosis not present

## 2019-07-27 DIAGNOSIS — R1031 Right lower quadrant pain: Secondary | ICD-10-CM | POA: Diagnosis not present

## 2019-07-27 LAB — COMPREHENSIVE METABOLIC PANEL
ALT: 25 U/L (ref 0–44)
AST: 23 U/L (ref 15–41)
Albumin: 4.4 g/dL (ref 3.5–5.0)
Alkaline Phosphatase: 92 U/L (ref 38–126)
Anion gap: 10 (ref 5–15)
BUN: 12 mg/dL (ref 6–20)
CO2: 25 mmol/L (ref 22–32)
Calcium: 9.4 mg/dL (ref 8.9–10.3)
Chloride: 104 mmol/L (ref 98–111)
Creatinine, Ser: 0.84 mg/dL (ref 0.44–1.00)
GFR calc Af Amer: >60 mL/min (ref >60)
GFR calc non Af Amer: >60 mL/min (ref >60)
Glucose, Bld: 99 mg/dL (ref 70–99)
Potassium: 4 mmol/L (ref 3.5–5.1)
Sodium: 139 mmol/L (ref 135–145)
Total Bilirubin: 1.2 mg/dL (ref 0.3–1.2)
Total Protein: 7.5 g/dL (ref 6.5–8.1)

## 2019-07-27 LAB — CBC
HCT: 41 % (ref 36.0–46.0)
Hemoglobin: 13.6 g/dL (ref 12.0–15.0)
MCH: 29.6 pg (ref 26.0–34.0)
MCHC: 33.2 g/dL (ref 30.0–36.0)
MCV: 89.1 fL (ref 80.0–100.0)
Platelets: 343 10*3/uL (ref 150–400)
RBC: 4.6 MIL/uL (ref 3.87–5.11)
RDW: 13.2 % (ref 11.5–15.5)
WBC: 9.6 10*3/uL (ref 4.0–10.5)
nRBC: 0 % (ref 0.0–0.2)

## 2019-07-27 LAB — URINALYSIS, COMPLETE (UACMP) WITH MICROSCOPIC
Bacteria, UA: NONE SEEN
Bilirubin Urine: NEGATIVE
Glucose, UA: NEGATIVE mg/dL
Ketones, ur: NEGATIVE mg/dL
Nitrite: NEGATIVE
Protein, ur: 30 mg/dL — AB
RBC / HPF: >50 RBC/hpf — ABNORMAL HIGH (ref 0–5)
Specific Gravity, Urine: 1.018 (ref 1.005–1.030)
pH: 5 (ref 5.0–8.0)

## 2019-07-27 LAB — LIPASE, BLOOD: Lipase: 30 U/L (ref 11–51)

## 2019-07-27 MED ORDER — ONDANSETRON HCL 4 MG/2ML IJ SOLN
4.0000 mg | Freq: Once | INTRAMUSCULAR | Status: AC
  Administered 2019-07-27: 4 mg via INTRAVENOUS
  Filled 2019-07-27: qty 2

## 2019-07-27 MED ORDER — MORPHINE SULFATE (PF) 4 MG/ML IV SOLN
4.0000 mg | INTRAVENOUS | Status: DC | PRN
  Filled 2019-07-27: qty 1

## 2019-07-27 MED ORDER — HYDROCODONE-ACETAMINOPHEN 5-325 MG PO TABS
1.0000 | ORAL_TABLET | Freq: Once | ORAL | Status: AC
  Administered 2019-07-27: 1 via ORAL
  Filled 2019-07-27: qty 1

## 2019-07-27 MED ORDER — IOHEXOL 300 MG/ML  SOLN
100.0000 mL | Freq: Once | INTRAMUSCULAR | Status: AC | PRN
  Administered 2019-07-27: 16:00:00 100 mL via INTRAVENOUS
  Filled 2019-07-27: qty 100

## 2019-07-27 MED ORDER — HYDROCODONE-ACETAMINOPHEN 5-325 MG PO TABS: 1.0000 | ORAL_TABLET | ORAL | 0 refills | Status: DC | PRN

## 2019-07-27 MED ORDER — FENTANYL CITRATE (PF) 100 MCG/2ML IJ SOLN: 50.0000 ug | Freq: Once | INTRAMUSCULAR | Status: DC

## 2019-07-27 NOTE — Discharge Instructions (Addendum)
You have been seen in the emergency department for emergency care. It is important that you contact your own doctor, specialist or the closest clinic for follow-up care. Please bring this instruction sheet, all medications and X-ray copies with you when you are seen for follow-up care.  Determining the exact cause for all patients with abdominal pain is extremely difficult in the emergency department. Our primary focus is to rule-out immediate life-threatening diseases. If no immediate source of pain is found the definitive diagnosis frequently needs to be determined over time.Many times your primary care physician can determine the cause by following the symptoms over time. Sometimes, specialist are required such as Gastroenterologists, Gynecologists, Urologists or Surgeons. Please return immediately to the Emergency Department for fever>101, Vomiting or Intractable Pain. You should return to the emergency department or see your primary care provider in 12-24hrs if your pain is no better and sooner if your pain becomes worse.    IMPRESSION: 1. 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 This is the most likely source pain. 2. Minimal sigmoid diverticulosis without diverticulitis. 3. Ventral hernia repair without recurrence. 4. Postsurgical changes in the lumbar spine. 5. Bilateral nephrolithiasis. 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     Electronically Signed   By: San Morelle M.D.   On: 07/27/2019 17:16

## 2019-07-27 NOTE — ED Provider Notes (Signed)
[facility] Emergency Department Provider Note    First MD Initiated Contact with Patient 07/27/19 1528     (approximate)  I have reviewed the triage vital signs and the nursing notes.   HISTORY  Chief Complaint Abdominal Pain    HPI Brittany Cain is a 58 y.o. female below listed past medical history with recent hernia surgery presents with chills and progressive worsening right lower quadrant pain.  Had a few episodes of right lower quadrant pain 2 days ago today has been more steady achy right watery in nature.  Denies any dysuria past time but states that she is on antibiotics for cystitis.  Does still have her appendix.  No vomiting, + nausea.   Past Medical History:  Diagnosis Date  . Anxiety   . Chicken pox   . Chronic back pain   . Depression   . GERD (gastroesophageal reflux disease)   . History of kidney stones   . Hypertension   . Obesity   . Restless leg syndrome    Family History  Problem Relation Age of Onset  . Stroke Mother   . Prostate cancer Father   . Breast cancer Neg Hx    Past Surgical History:  Procedure Laterality Date  . ABDOMINAL HYSTERECTOMY    . BACK SURGERY     lower back; metal in lower back  . CHOLECYSTECTOMY    . ESOPHAGOGASTRODUODENOSCOPY (EGD) WITH PROPOFOL N/A 05/24/2017   Procedure: ESOPHAGOGASTRODUODENOSCOPY (EGD) WITH PROPOFOL;  Surgeon: Christena DeemSkulskie, Martin U, MD;  [facility];  Service: Endoscopy;  Laterality: N/A;  . INCISIONAL HERNIA REPAIR N/A 06/01/2019   Procedure: LAPAROSCOPIC INCISIONAL HERNIA REPAIR WITH MESH;  Surgeon: Carolan Shiverintron-Diaz, Edgardo, MD;  Location: [facility] ORS;  Service: General;  Laterality: N/A;   Patient Active Problem List   Diagnosis Date Noted  . Major depressive disorder, recurrent episode, severe (HCC) 04/09/2014  . MDD (major depressive disorder), recurrent episode, severe (HCC) 04/08/2014  . GAD (generalized anxiety disorder) 03/25/2014      Prior to Admission  medications   Medication Sig Start Date End Date Taking? Authorizing Provider  APAP-Pamabrom-Pyrilamine (PAMPRIN MULTI-SYMPTOM) 500-25-15 MG TABS Take 2 tablets by mouth at bedtime.    [provider]  atorvastatin (LIPITOR) 40 MG tablet Take 40 mg by mouth at bedtime.     [provider]  buPROPion (WELLBUTRIN SR) 200 MG 12 hr tablet Take 200 mg by mouth 2 (two) times daily.    [provider]  Cholecalciferol (VITAMIN D3) 125 MCG (5000 UT) CAPS Take 5,000 Units by mouth daily.    [provider]  cyanocobalamin 1000 MCG tablet Take 1,000 mcg by mouth daily.    [provider]  famotidine (PEPCID) 20 MG tablet Take 20 mg by mouth 2 (two) times daily.    [provider]  HYDROcodone-acetaminophen (NORCO) 5-325 MG tablet Take 1 tablet by mouth every 4 (four) hours as needed for moderate pain. 07/27/19   Willy Eddyobinson, Farheen Pfahler, MD  loratadine (CLARITIN) 10 MG tablet Take 10 mg by mouth daily.    [provider]  losartan (COZAAR) 100 MG tablet Take 100 mg by mouth daily.    [provider]  omeprazole (PRILOSEC) 20 MG capsule Take 20 mg by mouth daily.    [provider]  pyridOXINE (VITAMIN B-6) 50 MG tablet Take 50 mg by mouth 2 (two) times daily.    [provider]  rOPINIRole (REQUIP) 0.25 MG tablet Take 1 tablet (0.25 mg  total) by mouth at bedtime. 03/31/14   Ruben Im, PA-C    Allergies Aciphex [rabeprazole], Ceftin [cefuroxime axetil], Protonix [pantoprazole], and Vilazodone hcl    Social History Social History   Tobacco Use  . Smoking status: Never Smoker  . Smokeless tobacco: Never Used  Substance Use Topics  . Alcohol use: No  . Drug use: No    Review of Systems Patient denies headaches, rhinorrhea, blurry vision, numbness, shortness of breath, chest pain, edema, cough, abdominal pain, nausea, vomiting, diarrhea, dysuria, fevers, rashes or hallucinations unless otherwise stated above in  HPI. ____________________________________________   PHYSICAL EXAM:  VITAL SIGNS: Vitals:   07/27/19 1451 07/27/19 1737  BP: (!) 127/96 (!) 127/95  Pulse: 78 75  Resp: 16 18  Temp: 99.2 F (37.3 C)   SpO2: 100% 100%    Constitutional: Alert and oriented.  Eyes: Conjunctivae are normal.  Head: Atraumatic. Nose: No congestion/rhinnorhea. Mouth/Throat: Mucous membranes are moist.   Neck: No stridor. Painless ROM.  Cardiovascular: Normal rate, regular rhythm. Grossly normal heart sounds.  Good peripheral circulation. Respiratory: Normal respiratory effort.  No retractions. Lungs CTAB. Gastrointestinal: Soft, with ttp of RLQ. No distention. No abdominal bruits. No CVA tenderness. Genitourinary:  Musculoskeletal: No lower extremity tenderness nor edema.  No joint effusions. Neurologic:  Normal speech and language. No gross focal neurologic deficits are appreciated. No facial droop Skin:  Skin is warm, dry and intact. No rash noted. Psychiatric: Mood and affect are normal. Speech and behavior are normal.  ____________________________________________   LABS (all labs ordered are listed, but only abnormal results are displayed)  Results for orders placed or performed during the hospital encounter of 07/27/19 (from the past 24 hour(s))  Lipase, blood     Status: None   Collection Time: 07/27/19  2:55 PM  Result Value Ref Range   Lipase 30 11 - 51 U/L  Comprehensive metabolic panel     Status: None   Collection Time: 07/27/19  2:55 PM  Result Value Ref Range   Sodium 139 135 - 145 mmol/L   Potassium 4.0 3.5 - 5.1 mmol/L   Chloride 104 98 - 111 mmol/L   CO2 25 22 - 32 mmol/L   Glucose, Bld 99 70 - 99 mg/dL   BUN 12 6 - 20 mg/dL   Creatinine, Ser 0.84 0.44 - 1.00 mg/dL   Calcium 9.4 8.9 - 10.3 mg/dL   Total Protein 7.5 6.5 - 8.1 g/dL   Albumin 4.4 3.5 - 5.0 g/dL   AST 23 15 - 41 U/L   ALT 25 0 - 44 U/L   Alkaline Phosphatase 92 38 - 126 U/L   Total Bilirubin 1.2 0.3 - 1.2  mg/dL   GFR calc non Af Amer >60 >60 mL/min   GFR calc Af Amer >60 >60 mL/min   Anion gap 10 5 - 15  CBC     Status: None   Collection Time: 07/27/19  2:55 PM  Result Value Ref Range   WBC 9.6 4.0 - 10.5 K/uL   RBC 4.60 3.87 - 5.11 MIL/uL   Hemoglobin 13.6 12.0 - 15.0 g/dL   HCT 41.0 36.0 - 46.0 %   MCV 89.1 80.0 - 100.0 fL   MCH 29.6 26.0 - 34.0 pg   MCHC 33.2 30.0 - 36.0 g/dL   RDW 13.2 11.5 - 15.5 %   Platelets 343 150 - 400 K/uL   nRBC 0.0 0.0 - 0.2 %  Urinalysis, Complete w Microscopic  Status: Abnormal   Collection Time: 07/27/19  2:55 PM  Result Value Ref Range   Color, Urine AMBER (A) YELLOW   APPearance HAZY (A) CLEAR   Specific Gravity, Urine 1.018 1.005 - 1.030   pH 5.0 5.0 - 8.0   Glucose, UA NEGATIVE NEGATIVE mg/dL   Hgb urine dipstick LARGE (A) NEGATIVE   Bilirubin Urine NEGATIVE NEGATIVE   Ketones, ur NEGATIVE NEGATIVE mg/dL   Protein, ur 30 (A) NEGATIVE mg/dL   Nitrite NEGATIVE NEGATIVE   Leukocytes,Ua SMALL (A) NEGATIVE   RBC / HPF >50 (H) 0 - 5 RBC/hpf   WBC, UA 11-20 0 - 5 WBC/hpf   Bacteria, UA NONE SEEN NONE SEEN   Squamous Epithelial / LPF 0-5 0 - 5   Mucus PRESENT    ____________________________________________  EKG____________________________________________  RADIOLOGY  I personally reviewed all radiographic images ordered to evaluate for the above acute complaints and reviewed radiology reports and findings.  These findings were personally discussed with the patient.  Please see medical record for radiology report.  ____________________________________________   PROCEDURES  Procedure(s) performed:  Procedures    Critical Care performed: no ____________________________________________   INITIAL IMPRESSION / ASSESSMENT AND PLAN / ED COURSE  Pertinent labs & imaging results that were available during my care of the patient were reviewed by me and considered in my medical decision making (see chart for details).   DDX: appy,  abscess, msk strain ,stone, cystitis, pyelo, colitis  Brittany Cain is a 58 y.o. who presents to the ED with symptoms as described above.  Does have some right lower quadrant tenderness palpation.  Based on urinalysis have a high suspicion for stone however given her recent postoperative state with low-grade temperature here do feel that CT imaging with contrast would be more appropriate to further evaluate for acute intra-abdominal process.  Will provide IV pain medication IV antiemetic and reassessment.  Clinical Course as of Jul 26 1737  Mon Jul 27, 2019  1729 CT imaging without any evidence of acute appendicitis abscess or perforation.  Discussed case with Dr. Maia Planintron.  No indication for further intervention at this time.  Will initiate NSAIDs.  She is already on antibiotics for UTI.  Will give additional pain medication.  Have discussed with the patient and available family all diagnostics and treatments performed thus far and all questions were answered to the best of my ability. The patient demonstrates understanding and agreement with plan.    [PR]    Clinical Course User Index [PR] Willy Eddyobinson, Theordore Cisnero, MD    The patient was evaluated in Emergency Department today for the symptoms described in the history of present illness. He/she was evaluated in the context of the global COVID-19 pandemic, which necessitated consideration that the patient might be at risk for infection with the SARS-CoV-2 virus that causes COVID-19. Institutional protocols and algorithms that pertain to the evaluation of patients at risk for COVID-19 are in a state of rapid change based on information released by regulatory bodies including the CDC and federal and state organizations. These policies and algorithms were followed during the patient's care in the ED.  As part of my medical decision making, I reviewed the following data within the electronic MEDICAL RECORD NUMBER Nursing notes reviewed and incorporated, Labs  reviewed, notes from prior ED visits and [place] Controlled Substance Database   ____________________________________________   FINAL CLINICAL IMPRESSION(S) / ED DIAGNOSES  Final diagnoses:  Right lower quadrant abdominal pain      NEW MEDICATIONS STARTED  DURING THIS VISIT:  New Prescriptions   HYDROCODONE-ACETAMINOPHEN (NORCO) 5-325 MG TABLET    Take 1 tablet by mouth every 4 (four) hours as needed for moderate pain.     Note:  This document was prepared using Dragon voice recognition software and may include unintentional dictation errors.    Willy Eddyobinson, Margaret Staggs, MD 07/27/19 [phone]

## 2019-07-27 NOTE — ED Triage Notes (Addendum)
Patient presents to the ED with right lower abdominal pain x 3 days.  Patient reports nausea, denies vomiting and diarrhea.  Patient reports being diagnosed with UTI today and was told that was likely not causing her abdominal pain and she should come to the ED.  Patient also had hernia surgery approx. 5 weeks ago.

## 2019-10-29 ENCOUNTER — Encounter
Admission: RE | Disposition: A | Discharge status: Home / Self Care | Payer: Self-pay | Source: Home / Self Care | Attending: Urology

## 2019-10-29 ENCOUNTER — Ambulatory Visit
Admission: RE | Admit: 2019-10-29 | Discharge: 2019-10-29 | Disposition: A | Discharge status: Home / Self Care | Payer: BC Managed Care – PPO | Attending: Urology | Admitting: Urology

## 2019-10-29 ENCOUNTER — Other Ambulatory Visit: Payer: Self-pay

## 2019-10-29 DIAGNOSIS — F329 Major depressive disorder, single episode, unspecified: Secondary | ICD-10-CM | POA: Diagnosis not present

## 2019-10-29 DIAGNOSIS — I10 Essential (primary) hypertension: Secondary | ICD-10-CM | POA: Insufficient documentation

## 2019-10-29 DIAGNOSIS — N200 Calculus of kidney: Secondary | ICD-10-CM | POA: Diagnosis not present

## 2019-10-29 DIAGNOSIS — Z79899 Other long term (current) drug therapy: Secondary | ICD-10-CM | POA: Diagnosis not present

## 2019-10-29 HISTORY — PX: EXTRACORPOREAL SHOCK WAVE LITHOTRIPSY: SHX1557

## 2019-10-29 SURGERY — LITHOTRIPSY, ESWL
Anesthesia: Moderate Sedation | Laterality: Left

## 2019-10-29 MED ORDER — DIPHENHYDRAMINE HCL 25 MG PO CAPS
25.0000 mg | ORAL_CAPSULE | ORAL | Status: AC
  Administered 2019-10-29: 12:00:00 25 mg via ORAL

## 2019-10-29 MED ORDER — HYDROCODONE-ACETAMINOPHEN 10-325 MG PO TABS: 1.0000 | ORAL_TABLET | ORAL | 0 refills | Status: DC | PRN

## 2019-10-29 MED ORDER — LEVOFLOXACIN 500 MG PO TABS
500.0000 mg | ORAL_TABLET | ORAL | Status: AC
  Administered 2019-10-29: 12:00:00 500 mg via ORAL

## 2019-10-29 MED ORDER — FUROSEMIDE 10 MG/ML IJ SOLN
10.0000 mg | Freq: Once | INTRAMUSCULAR | Status: AC
  Administered 2019-10-29: 14:00:00 10 mg via INTRAVENOUS

## 2019-10-29 MED ORDER — DEXTROSE-NACL 5-0.45 % IV SOLN
INTRAVENOUS | Status: DC
  Administered 2019-10-29: 12:00:00 via INTRAVENOUS

## 2019-10-29 MED ORDER — ONDANSETRON 8 MG PO TBDP: 8.0000 mg | ORAL_TABLET | Freq: Four times a day (QID) | ORAL | 3 refills | Status: DC | PRN

## 2019-10-29 MED ORDER — DIPHENHYDRAMINE HCL 25 MG PO CAPS
ORAL_CAPSULE | ORAL | Status: AC
  Administered 2019-10-29: 25 mg via ORAL
  Filled 2019-10-29: qty 1

## 2019-10-29 MED ORDER — LEVOFLOXACIN 500 MG PO TABS
ORAL_TABLET | ORAL | Status: AC
  Administered 2019-10-29: 12:00:00 500 mg via ORAL
  Filled 2019-10-29: qty 1

## 2019-10-29 MED ORDER — TAMSULOSIN HCL 0.4 MG PO CAPS: 0.4000 mg | ORAL_CAPSULE | Freq: Every day | ORAL | 1 refills | Status: DC

## 2019-10-29 MED ORDER — MORPHINE SULFATE (PF) 10 MG/ML IV SOLN
10.0000 mg | Freq: Once | INTRAVENOUS | Status: AC
  Administered 2019-10-29: 12:00:00 10 mg via INTRAMUSCULAR

## 2019-10-29 MED ORDER — MORPHINE SULFATE (PF) 10 MG/ML IV SOLN
INTRAVENOUS | Status: AC
  Administered 2019-10-29: 10 mg via INTRAMUSCULAR
  Filled 2019-10-29: qty 1

## 2019-10-29 MED ORDER — PROMETHAZINE HCL 25 MG/ML IJ SOLN
25.0000 mg | Freq: Once | INTRAMUSCULAR | Status: AC
  Administered 2019-10-29: 12:00:00 25 mg via INTRAMUSCULAR

## 2019-10-29 MED ORDER — MIDAZOLAM HCL 2 MG/2ML IJ SOLN
1.0000 mg | Freq: Once | INTRAMUSCULAR | Status: AC
  Administered 2019-10-29: 12:00:00 1 mg via INTRAMUSCULAR

## 2019-10-29 MED ORDER — FUROSEMIDE 10 MG/ML IJ SOLN
INTRAMUSCULAR | Status: AC
  Filled 2019-10-29: qty 2

## 2019-10-29 MED ORDER — PROMETHAZINE HCL 25 MG/ML IJ SOLN
INTRAMUSCULAR | Status: AC
  Administered 2019-10-29: 12:00:00 25 mg via INTRAMUSCULAR
  Filled 2019-10-29: qty 1

## 2019-10-29 MED ORDER — MIDAZOLAM HCL 2 MG/2ML IJ SOLN
INTRAMUSCULAR | Status: AC
  Administered 2019-10-29: 12:00:00 1 mg via INTRAMUSCULAR
  Filled 2019-10-29: qty 2

## 2019-10-29 NOTE — Discharge Instructions (Signed)

## 2019-10-30 ENCOUNTER — Emergency Department: Payer: BC Managed Care – PPO

## 2019-10-30 ENCOUNTER — Emergency Department
Admission: EM | Admit: 2019-10-30 | Discharge: 2019-10-30 | Disposition: A | Discharge status: Home / Self Care | Payer: BC Managed Care – PPO | Attending: Emergency Medicine | Admitting: Emergency Medicine

## 2019-10-30 ENCOUNTER — Other Ambulatory Visit: Payer: Self-pay

## 2019-10-30 ENCOUNTER — Encounter: Payer: Self-pay | Admitting: Emergency Medicine

## 2019-10-30 DIAGNOSIS — I10 Essential (primary) hypertension: Secondary | ICD-10-CM | POA: Insufficient documentation

## 2019-10-30 DIAGNOSIS — Z79899 Other long term (current) drug therapy: Secondary | ICD-10-CM | POA: Insufficient documentation

## 2019-10-30 DIAGNOSIS — N201 Calculus of ureter: Secondary | ICD-10-CM | POA: Insufficient documentation

## 2019-10-30 DIAGNOSIS — R109 Unspecified abdominal pain: Secondary | ICD-10-CM | POA: Diagnosis present

## 2019-10-30 DIAGNOSIS — R110 Nausea: Secondary | ICD-10-CM | POA: Diagnosis not present

## 2019-10-30 LAB — CBC
HCT: 40.4 % (ref 36.0–46.0)
Hemoglobin: 13.4 g/dL (ref 12.0–15.0)
MCH: 29.4 pg (ref 26.0–34.0)
MCHC: 33.2 g/dL (ref 30.0–36.0)
MCV: 88.6 fL (ref 80.0–100.0)
Platelets: 308 10*3/uL (ref 150–400)
RBC: 4.56 MIL/uL (ref 3.87–5.11)
RDW: 12.9 % (ref 11.5–15.5)
WBC: 16.3 10*3/uL — ABNORMAL HIGH (ref 4.0–10.5)
nRBC: 0 % (ref 0.0–0.2)

## 2019-10-30 LAB — URINALYSIS, COMPLETE (UACMP) WITH MICROSCOPIC
Bacteria, UA: NONE SEEN
Bilirubin Urine: NEGATIVE
Glucose, UA: NEGATIVE mg/dL
Ketones, ur: NEGATIVE mg/dL
Nitrite: NEGATIVE
Protein, ur: NEGATIVE mg/dL
Specific Gravity, Urine: 1.005 (ref 1.005–1.030)
pH: 6 (ref 5.0–8.0)

## 2019-10-30 LAB — BASIC METABOLIC PANEL
Anion gap: 11 (ref 5–15)
BUN: 17 mg/dL (ref 6–20)
CO2: 23 mmol/L (ref 22–32)
Calcium: 8.9 mg/dL (ref 8.9–10.3)
Chloride: 101 mmol/L (ref 98–111)
Creatinine, Ser: 0.97 mg/dL (ref 0.44–1.00)
GFR calc Af Amer: >60 mL/min (ref >60)
GFR calc non Af Amer: >60 mL/min (ref >60)
Glucose, Bld: 148 mg/dL — ABNORMAL HIGH (ref 70–99)
Potassium: 4 mmol/L (ref 3.5–5.1)
Sodium: 135 mmol/L (ref 135–145)

## 2019-10-30 MED ORDER — ONDANSETRON HCL 4 MG/2ML IJ SOLN
4.0000 mg | Freq: Once | INTRAMUSCULAR | Status: AC
  Administered 2019-10-30: 4 mg via INTRAVENOUS
  Filled 2019-10-30: qty 2

## 2019-10-30 MED ORDER — MORPHINE SULFATE (PF) 4 MG/ML IV SOLN
4.0000 mg | Freq: Once | INTRAVENOUS | Status: AC
  Administered 2019-10-30: 06:00:00 4 mg via INTRAVENOUS
  Filled 2019-10-30: qty 1

## 2019-10-30 MED ORDER — SODIUM CHLORIDE 0.9 % IV BOLUS
1000.0000 mL | Freq: Once | INTRAVENOUS | Status: AC
  Administered 2019-10-30: 1000 mL via INTRAVENOUS

## 2019-10-30 NOTE — ED Triage Notes (Signed)
Pt to triage via w/c, mask in place; pt appears uncomfortable; st lithotripsy performed yesterday, having uncontrolled pain

## 2019-10-30 NOTE — ED Notes (Signed)
Patient aware she needs to provide urine sample

## 2019-10-30 NOTE — Progress Notes (Signed)
Called pt for followup Pt stated that her pain was severe and arrived in the ED @0430  And was treated for pain and released from the ED, pt stated that she talked to Dr Yves Dill.

## 2019-10-30 NOTE — ED Notes (Addendum)
Pt alert and oriented X 4, stable for discharge. RR even and unlabored, color WNL. Discussed discharge instructions and follow up when appropriate. Instructed to follow up with ER for any life threatening symptoms or concerns that patient or family of patient may have Pt on phone with Dr. Rogers Blocker office currently.

## 2019-10-30 NOTE — ED Notes (Signed)
Dr. Owens Shark stopped fluids as patient has blockage

## 2019-10-30 NOTE — ED Provider Notes (Signed)
[facility] Emergency Department Provider Note ___________   First MD Initiated Contact with Patient 10/30/19 [phone]     (approximate)  I have reviewed the triage vital signs and the nursing notes.   HISTORY  Chief Complaint Flank Pain   HPI Brittany Cain is a 58 y.o. female with below list of previous medical conditions including left kidney stone for which the patient underwent lithotripsy yesterday performed by Dr. Sheppard PentonWolf presents to the emergency department secondary to 10 out of 10 left flank pain which patient states is ongoing controlled with oxycodone 10 mg tablets at home.  Patient denies any fever.  Patient does admit to nausea however no vomiting.        Past Medical History:  Diagnosis Date  . Anxiety   . Chicken pox   . Chronic back pain   . Depression   . GERD (gastroesophageal reflux disease)   . History of kidney stones   . Hypertension   . Obesity   . Restless leg syndrome     Patient Active Problem List   Diagnosis Date Noted  . Major depressive disorder, recurrent episode, severe (HCC) 04/09/2014  . MDD (major depressive disorder), recurrent episode, severe (HCC) 04/08/2014  . GAD (generalized anxiety disorder) 03/25/2014    Past Surgical History:  Procedure Laterality Date  . ABDOMINAL HYSTERECTOMY    . BACK SURGERY     lower back; metal in lower back  . CHOLECYSTECTOMY    . ESOPHAGOGASTRODUODENOSCOPY (EGD) WITH PROPOFOL N/A 05/24/2017   Procedure: ESOPHAGOGASTRODUODENOSCOPY (EGD) WITH PROPOFOL;  Surgeon: Christena DeemSkulskie, Martin U, MD;  [facility];  Service: Endoscopy;  Laterality: N/A;  . INCISIONAL HERNIA REPAIR N/A 06/01/2019   Procedure: LAPAROSCOPIC INCISIONAL HERNIA REPAIR WITH MESH;  Surgeon: Carolan Shiverintron-Diaz, Edgardo, MD;  Location: [facility] ORS;  Service: General;  Laterality: N/A;    Prior to Admission medications   Medication Sig Start Date End Date Taking? Authorizing Provider  APAP-Pamabrom-Pyrilamine  (PAMPRIN MULTI-SYMPTOM) 500-25-15 MG TABS Take 2 tablets by mouth at bedtime.    [provider]  atorvastatin (LIPITOR) 40 MG tablet Take 40 mg by mouth at bedtime.     [provider]  buPROPion (WELLBUTRIN SR) 200 MG 12 hr tablet Take 200 mg by mouth 2 (two) times daily.    [provider]  Cholecalciferol (VITAMIN D3) 125 MCG (5000 UT) CAPS Take 5,000 Units by mouth daily.    [provider]  cyanocobalamin 1000 MCG tablet Take 1,000 mcg by mouth daily.    [provider]  famotidine (PEPCID) 20 MG tablet Take 20 mg by mouth 2 (two) times daily.    [provider]  HYDROcodone-acetaminophen (NORCO) 10-325 MG tablet Take 1 tablet by mouth every 4 (four) hours as needed for moderate pain. Maximum dose per 24 hours -8 pills. 10/29/19   Orson ApeWolff, Michael R, MD  HYDROcodone-acetaminophen (NORCO) 5-325 MG tablet Take 1 tablet by mouth every 4 (four) hours as needed for moderate pain. 07/27/19   Willy Eddyobinson, Patrick, MD  loratadine (CLARITIN) 10 MG tablet Take 10 mg by mouth daily.    [provider]  losartan (COZAAR) 100 MG tablet Take 100 mg by mouth daily.    [provider]  omeprazole (PRILOSEC) 20 MG capsule Take 20 mg by mouth daily.    [provider]  ondansetron (ZOFRAN ODT) 8 MG disintegrating tablet Take 1 tablet (8 mg total) by mouth every 6 (six) hours as needed for nausea or vomiting. 10/29/19  Orson Ape, MD  pyridOXINE (VITAMIN B-6) 50 MG tablet Take 50 mg by mouth 2 (two) times daily.    [provider]  rOPINIRole (REQUIP) 0.25 MG tablet Take 1 tablet (0.25 mg total) by mouth at bedtime. Patient not taking: Reported on 10/29/2019 03/31/14   Verne Spurr T, PA-C  tamsulosin (FLOMAX) 0.4 MG CAPS capsule Take 1 capsule (0.4 mg total) by mouth daily. 10/29/19   Orson Ape, MD    Allergies Aciphex [rabeprazole], Bactrim [sulfamethoxazole-trimethoprim], Ceftin [cefuroxime axetil], Protonix  [pantoprazole], and Vilazodone hcl  Family History  Problem Relation Age of Onset  . Stroke Mother   . Prostate cancer Father   . Breast cancer Neg Hx     Social History Social History   Tobacco Use  . Smoking status: Never Smoker  . Smokeless tobacco: Never Used  Substance Use Topics  . Alcohol use: No  . Drug use: No    Review of Systems Constitutional: No fever/chills Eyes: No visual changes. ENT: No sore throat. Cardiovascular: Denies chest pain. Respiratory: Denies shortness of breath. Gastrointestinal: Positive for left flank pain.  No nausea, no vomiting.  No diarrhea.  No constipation. Genitourinary: Negative for dysuria. Musculoskeletal: Negative for neck pain.  Negative for back pain. Integumentary: Negative for rash. Neurological: Negative for headaches, focal weakness or numbness.   ____________________________________________   PHYSICAL EXAM:  VITAL SIGNS: ED Triage Vitals  Enc Vitals Group     BP 10/30/19 0510 108/77     Pulse Rate 10/30/19 0510 (!) 110     Resp 10/30/19 0510 (!) 22     Temp 10/30/19 0510 98.4 F (36.9 C)     Temp Source 10/30/19 0510 Oral     SpO2 10/30/19 0510 99 %     Weight 10/30/19 0508 106.6 kg (235 lb 0.2 oz)     Height 10/30/19 0508 1.6 m ( )     Head Circumference --      Peak Flow --      Pain Score 10/30/19 0509 7     Pain Loc --      Pain Edu? --      Excl. in GC? --     Constitutional: Alert and oriented.  Apparent discomfort Eyes: Conjunctivae are normal. . Mouth/Throat: Patient is wearing a mask. Neck: No stridor.  No meningeal signs.   Cardiovascular: Normal rate, regular rhythm. Good peripheral circulation. Grossly normal heart sounds. Respiratory: Normal respiratory effort.  No retractions. Gastrointestinal: Soft and nontender. No distention.  Musculoskeletal: No lower extremity tenderness nor edema. No gross deformities of extremities. Neurologic:  Normal speech and language. No gross focal  neurologic deficits are appreciated.  Skin: Left flank ecchymosis Psychiatric: Mood and affect are normal. Speech and behavior are normal.  ____________________________________________   LABS (all labs ordered are listed, but only abnormal results are displayed)  Labs Reviewed  BASIC METABOLIC PANEL - Abnormal; Notable for the following components:      Result Value   Glucose, Bld 148 (*)    All other components within normal limits  CBC - Abnormal; Notable for the following components:   WBC 16.3 (*)    All other components within normal limits  URINALYSIS, COMPLETE (UACMP) WITH MICROSCOPIC   ________  RADIOLOGY I, Tangent N Bre [place], personally viewed and evaluated these images (plain radiographs) as part of my medical decision making, as well as reviewing the written report by the radiologist.  ED MD interpretation: Obstructing stone fragments in the distal left ureter  measuring 1.5 cm.  Moderate left hydroureteronephrosis additional nonobstructing stone fragments are present within the dependent calyces of the left kidney layering stone fragments in the dependent bladder per radiologist.  Official radiology report(s): Ct Renal Stone Study  Result Date: 10/30/2019 CLINICAL DATA:  Left flank pain. Status post lithotripsy. EXAM: CT ABDOMEN AND PELVIS WITHOUT CONTRAST TECHNIQUE: Multidetector CT imaging of the abdomen and pelvis was performed following the standard protocol without IV contrast. COMPARISON:  CT of the abdomen and pelvis 07/27/2019 FINDINGS: Lower chest: The lung bases are clear without focal nodule or mass. The heart size is normal. Hepatobiliary: There is diffuse fatty infiltration of the liver. No discrete lesions are present. Cholecystectomy is noted. Common bile duct is within normal limits. Pancreas: Unremarkable. No pancreatic ductal dilatation or surrounding inflammatory changes. Spleen: Normal in size without focal abnormality. Adrenals/Urinary Tract: The adrenal  glands are normal bilaterally. A punctate nonobstructing stone is present at the lower pole of the right kidney. The right kidney and ureter are otherwise within normal limits. 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 There are nonobstructing stone fragments within dependent calices. Moderate left-sided hydronephrosis is present. The left ureter is dilated to the level of obstructing stone fragments in the lower ureter stone fragments measure over 1.5 cm in the distal ureter. Additional layering stones are present dependently within the urinary bladder. Stomach/Bowel: The stomach and duodenum are within normal limits. Small bowel is unremarkable. The appendix is visualized and within normal limits. The ascending and transverse colon are normal. Vascular/Lymphatic: No significant vascular findings are present. No enlarged abdominal or pelvic lymph nodes. Reproductive: Status post hysterectomy. No adnexal masses. Other: No abdominal wall hernia or abnormality. No abdominopelvic ascites. Musculoskeletal: Lumbar spine fusion is solid at L4-5 and L5-S1. Adjacent level disease is present at L3-4. No acute osseous abnormalities are present. No focal lytic or blastic lesions are present. The hips are located and within normal limits. IMPRESSION: 1. Obstructing stone fragments (steinstrasse) in the distal left ureter measuring up to 1.5 cm. 2. Moderate left-sided hydroureteronephrosis. 3. Additional nonobstructing stone fragments are present within dependent calices of the left kidney. 4. Layering stone fragments are present dependently within the urinary bladder. 5. Punctate nonobstructing stone at the lower pole of the right kidney. 6. Hepatic steatosis. 7. Cholecystectomy and hysterectomy. 8. Lumbar spine fusion at L4-5 and L5-S1. Electronically Signed   By: San Morelle M.D.   On: 10/30/2019 06:20     Procedures    ____________________________________________   INITIAL IMPRESSION / MDM / [place] / ED COURSE  As part of my medical decision making, I reviewed the following data within the electronic MEDICAL RECORD NUMBER   58 year old female presenting with above-stated history and physical exam secondary to left flank pain after lithotripsy.  Patient received IV morphine 4 mg and on reevaluation patient states that pain is much improved.  CT scan revealed 1.5 cm of cyst stone fragments in the left distal ureter.  Patient discussed with Dr. Yves Dill urology who recommended that the patient follow-up with him in office this morning.  I advised the patient of Dr. Eliberto Ivory commendations.  Patient is comfortable with no distress at present.     ____________________________________________  FINAL CLINICAL IMPRESSION(S) / ED DIAGNOSES  Final diagnoses:  Ureterolithiasis     MEDICATIONS GIVEN DURING THIS VISIT:  Medications  morphine 4 MG/ML injection 4 mg (4 mg Intravenous Given 10/30/19 0533)  ondansetron (ZOFRAN) injection 4  mg (4 mg Intravenous Given 10/30/19 0533)  sodium chloride 0.9 % bolus 1,000 mL (0 mLs Intravenous Stopped 10/30/19 6546)     ED Discharge Orders    None      *Please note:  HILLIARY JOCK was evaluated in Emergency Department on 10/30/2019 for the symptoms described in the history of present illness. She was evaluated in the context of the global COVID-19 pandemic, which necessitated consideration that the patient might be at risk for infection with the SARS-CoV-2 virus that causes COVID-19. Institutional protocols and algorithms that pertain to the evaluation of patients at risk for COVID-19 are in a state of rapid change based on information released by regulatory bodies including the CDC and federal and state organizations. These policies and algorithms were followed during the patient's care in the ED.  Some ED evaluations and interventions may be delayed as a result of  limited staffing during the pandemic.*  Note:  This document was prepared using Dragon voice recognition software and may include unintentional dictation errors.   Darci Current, MD 10/31/19 [phone]

## 2019-10-31 ENCOUNTER — Observation Stay: Payer: BC Managed Care – PPO | Admitting: Anesthesiology

## 2019-10-31 ENCOUNTER — Encounter
Admission: EM | Disposition: A | Discharge status: Home / Self Care | Payer: Self-pay | Source: Home / Self Care | Attending: Emergency Medicine

## 2019-10-31 ENCOUNTER — Observation Stay
Admission: EM | Admit: 2019-10-31 | Discharge: 2019-11-01 | Disposition: A | Discharge status: Home / Self Care | Payer: BC Managed Care – PPO | Attending: Internal Medicine | Admitting: Internal Medicine

## 2019-10-31 ENCOUNTER — Ambulatory Visit: Payer: Self-pay | Admitting: Urology

## 2019-10-31 ENCOUNTER — Encounter: Payer: Self-pay | Admitting: Emergency Medicine

## 2019-10-31 ENCOUNTER — Other Ambulatory Visit: Payer: Self-pay

## 2019-10-31 ENCOUNTER — Observation Stay: Payer: BC Managed Care – PPO

## 2019-10-31 DIAGNOSIS — Z6841 Body Mass Index (BMI) 40.0 and over, adult: Secondary | ICD-10-CM | POA: Diagnosis not present

## 2019-10-31 DIAGNOSIS — F332 Major depressive disorder, recurrent severe without psychotic features: Secondary | ICD-10-CM | POA: Insufficient documentation

## 2019-10-31 DIAGNOSIS — N201 Calculus of ureter: Secondary | ICD-10-CM | POA: Diagnosis not present

## 2019-10-31 DIAGNOSIS — N390 Urinary tract infection, site not specified: Secondary | ICD-10-CM | POA: Insufficient documentation

## 2019-10-31 DIAGNOSIS — N200 Calculus of kidney: Secondary | ICD-10-CM

## 2019-10-31 DIAGNOSIS — Z79899 Other long term (current) drug therapy: Secondary | ICD-10-CM | POA: Insufficient documentation

## 2019-10-31 DIAGNOSIS — G2581 Restless legs syndrome: Secondary | ICD-10-CM | POA: Insufficient documentation

## 2019-10-31 DIAGNOSIS — R739 Hyperglycemia, unspecified: Secondary | ICD-10-CM | POA: Diagnosis not present

## 2019-10-31 DIAGNOSIS — E785 Hyperlipidemia, unspecified: Secondary | ICD-10-CM | POA: Diagnosis not present

## 2019-10-31 DIAGNOSIS — N179 Acute kidney failure, unspecified: Secondary | ICD-10-CM

## 2019-10-31 DIAGNOSIS — N1330 Unspecified hydronephrosis: Secondary | ICD-10-CM

## 2019-10-31 DIAGNOSIS — E669 Obesity, unspecified: Secondary | ICD-10-CM | POA: Diagnosis not present

## 2019-10-31 DIAGNOSIS — K219 Gastro-esophageal reflux disease without esophagitis: Secondary | ICD-10-CM | POA: Diagnosis not present

## 2019-10-31 DIAGNOSIS — A419 Sepsis, unspecified organism: Principal | ICD-10-CM

## 2019-10-31 DIAGNOSIS — I10 Essential (primary) hypertension: Secondary | ICD-10-CM

## 2019-10-31 DIAGNOSIS — R109 Unspecified abdominal pain: Secondary | ICD-10-CM | POA: Diagnosis present

## 2019-10-31 DIAGNOSIS — Z20828 Contact with and (suspected) exposure to other viral communicable diseases: Secondary | ICD-10-CM | POA: Insufficient documentation

## 2019-10-31 DIAGNOSIS — N3001 Acute cystitis with hematuria: Secondary | ICD-10-CM

## 2019-10-31 DIAGNOSIS — R6520 Severe sepsis without septic shock: Secondary | ICD-10-CM

## 2019-10-31 HISTORY — PX: CYSTOSCOPY/URETEROSCOPY/HOLMIUM LASER/STENT PLACEMENT: SHX6546

## 2019-10-31 LAB — URINALYSIS, COMPLETE (UACMP) WITH MICROSCOPIC
Bacteria, UA: NONE SEEN
Bilirubin Urine: NEGATIVE
Glucose, UA: NEGATIVE mg/dL
Ketones, ur: 5 mg/dL — AB
Nitrite: NEGATIVE
Protein, ur: 30 mg/dL — AB
RBC / HPF: >50 RBC/hpf — ABNORMAL HIGH (ref 0–5)
Specific Gravity, Urine: 1.017 (ref 1.005–1.030)
pH: 5 (ref 5.0–8.0)

## 2019-10-31 LAB — SARS CORONAVIRUS 2 BY RT PCR (HOSPITAL ORDER, PERFORMED IN ~~LOC~~ HOSPITAL LAB): SARS Coronavirus 2: NEGATIVE

## 2019-10-31 LAB — PHOSPHORUS: Phosphorus: 2.2 mg/dL — ABNORMAL LOW (ref 2.5–4.6)

## 2019-10-31 LAB — CBC
HCT: 37.7 % (ref 36.0–46.0)
Hemoglobin: 12.8 g/dL (ref 12.0–15.0)
MCH: 29 pg (ref 26.0–34.0)
MCHC: 34 g/dL (ref 30.0–36.0)
MCV: 85.5 fL (ref 80.0–100.0)
Platelets: 289 10*3/uL (ref 150–400)
RBC: 4.41 MIL/uL (ref 3.87–5.11)
RDW: 13 % (ref 11.5–15.5)
WBC: 16.2 10*3/uL — ABNORMAL HIGH (ref 4.0–10.5)
nRBC: 0 % (ref 0.0–0.2)

## 2019-10-31 LAB — CBC WITH DIFFERENTIAL/PLATELET
Abs Immature Granulocytes: 0.03 10*3/uL (ref 0.00–0.07)
Basophils Absolute: 0 10*3/uL (ref 0.0–0.1)
Basophils Relative: 0 %
Eosinophils Absolute: 0 10*3/uL (ref 0.0–0.5)
Eosinophils Relative: 0 %
HCT: 36.9 % (ref 36.0–46.0)
Hemoglobin: 12.4 g/dL (ref 12.0–15.0)
Immature Granulocytes: 0 %
Lymphocytes Relative: 10 %
Lymphs Abs: 0.8 10*3/uL (ref 0.7–4.0)
MCH: 29.7 pg (ref 26.0–34.0)
MCHC: 33.6 g/dL (ref 30.0–36.0)
MCV: 88.5 fL (ref 80.0–100.0)
Monocytes Absolute: 0.1 10*3/uL (ref 0.1–1.0)
Monocytes Relative: 1 %
Neutro Abs: 7 10*3/uL (ref 1.7–7.7)
Neutrophils Relative %: 89 %
Platelets: 272 10*3/uL (ref 150–400)
RBC: 4.17 MIL/uL (ref 3.87–5.11)
RDW: 13 % (ref 11.5–15.5)
WBC: 8 10*3/uL (ref 4.0–10.5)
nRBC: 0 % (ref 0.0–0.2)

## 2019-10-31 LAB — COMPREHENSIVE METABOLIC PANEL
ALT: 27 U/L (ref 0–44)
AST: 23 U/L (ref 15–41)
Albumin: 4.1 g/dL (ref 3.5–5.0)
Alkaline Phosphatase: 73 U/L (ref 38–126)
Anion gap: 9 (ref 5–15)
BUN: 16 mg/dL (ref 6–20)
CO2: 24 mmol/L (ref 22–32)
Calcium: 8.7 mg/dL — ABNORMAL LOW (ref 8.9–10.3)
Chloride: 103 mmol/L (ref 98–111)
Creatinine, Ser: 1.34 mg/dL — ABNORMAL HIGH (ref 0.44–1.00)
GFR calc Af Amer: 50 mL/min — ABNORMAL LOW (ref >60)
GFR calc non Af Amer: 44 mL/min — ABNORMAL LOW (ref >60)
Glucose, Bld: 111 mg/dL — ABNORMAL HIGH (ref 70–99)
Potassium: 3.6 mmol/L (ref 3.5–5.1)
Sodium: 136 mmol/L (ref 135–145)
Total Bilirubin: 1.6 mg/dL — ABNORMAL HIGH (ref 0.3–1.2)
Total Protein: 7.3 g/dL (ref 6.5–8.1)

## 2019-10-31 LAB — MAGNESIUM: Magnesium: 2.3 mg/dL (ref 1.7–2.4)

## 2019-10-31 LAB — LIPASE, BLOOD: Lipase: 18 U/L (ref 11–51)

## 2019-10-31 LAB — TSH: TSH: 1.19 u[IU]/mL (ref 0.350–4.500)

## 2019-10-31 SURGERY — CYSTOSCOPY/URETEROSCOPY/HOLMIUM LASER/STENT PLACEMENT
Anesthesia: General | Laterality: Left

## 2019-10-31 MED ORDER — NITROFURANTOIN MONOHYD MACRO 100 MG PO CAPS
100.0000 mg | ORAL_CAPSULE | Freq: Two times a day (BID) | ORAL | Status: DC
  Administered 2019-10-31 – 2019-11-01 (×2): 100 mg via ORAL
  Filled 2019-10-31 (×3): qty 1

## 2019-10-31 MED ORDER — LEVOFLOXACIN IN D5W 500 MG/100ML IV SOLN
500.0000 mg | Freq: Once | INTRAVENOUS | Status: AC
  Administered 2019-10-31: 500 mg via INTRAVENOUS
  Filled 2019-10-31: qty 100

## 2019-10-31 MED ORDER — PROPOFOL 10 MG/ML IV BOLUS
INTRAVENOUS | Status: DC | PRN
  Administered 2019-10-31: 150 mg via INTRAVENOUS

## 2019-10-31 MED ORDER — LIDOCAINE HCL URETHRAL/MUCOSAL 2 % EX GEL
CUTANEOUS | Status: AC
  Filled 2019-10-31: qty 10

## 2019-10-31 MED ORDER — ONDANSETRON HCL 4 MG/2ML IJ SOLN
INTRAMUSCULAR | Status: DC | PRN
  Administered 2019-10-31: 4 mg via INTRAVENOUS

## 2019-10-31 MED ORDER — BELLADONNA ALKALOIDS-OPIUM 16.2-60 MG RE SUPP
RECTAL | Status: AC
  Filled 2019-10-31: qty 1

## 2019-10-31 MED ORDER — FENTANYL CITRATE (PF) 100 MCG/2ML IJ SOLN
50.0000 ug | INTRAMUSCULAR | Status: AC | PRN
  Administered 2019-10-31: 100 ug via INTRAVENOUS
  Administered 2019-10-31: 50 ug via INTRAVENOUS
  Filled 2019-10-31: qty 2

## 2019-10-31 MED ORDER — ONDANSETRON HCL 4 MG/2ML IJ SOLN
4.0000 mg | Freq: Once | INTRAMUSCULAR | Status: AC
  Administered 2019-10-31: 4 mg via INTRAVENOUS
  Filled 2019-10-31: qty 2

## 2019-10-31 MED ORDER — LACTATED RINGERS IV SOLN
INTRAVENOUS | Status: DC | PRN
  Administered 2019-10-31: 17:00:00 via INTRAVENOUS

## 2019-10-31 MED ORDER — NUCYNTA 50 MG PO TABS: 50.0000 mg | ORAL_TABLET | Freq: Four times a day (QID) | ORAL | 0 refills | Status: DC | PRN

## 2019-10-31 MED ORDER — ROCURONIUM BROMIDE 100 MG/10ML IV SOLN
INTRAVENOUS | Status: DC | PRN
  Administered 2019-10-31: 30 mg via INTRAVENOUS

## 2019-10-31 MED ORDER — SODIUM CHLORIDE 0.9 % IV SOLN
INTRAVENOUS | Status: DC
  Administered 2019-10-31 – 2019-11-01 (×3): via INTRAVENOUS

## 2019-10-31 MED ORDER — MIDAZOLAM HCL 2 MG/2ML IJ SOLN
INTRAMUSCULAR | Status: AC
  Filled 2019-10-31: qty 2

## 2019-10-31 MED ORDER — SUCCINYLCHOLINE CHLORIDE 20 MG/ML IJ SOLN
INTRAMUSCULAR | Status: DC | PRN
  Administered 2019-10-31: 100 mg via INTRAVENOUS

## 2019-10-31 MED ORDER — OXYCODONE HCL 5 MG PO TABS: 5.0000 mg | ORAL_TABLET | Freq: Once | ORAL | Status: DC | PRN

## 2019-10-31 MED ORDER — SODIUM CHLORIDE 0.9 % IV BOLUS: 1000.0000 mL | Freq: Once | INTRAVENOUS | Status: DC

## 2019-10-31 MED ORDER — MIDAZOLAM HCL 2 MG/2ML IJ SOLN
INTRAMUSCULAR | Status: DC | PRN
  Administered 2019-10-31: 2 mg via INTRAVENOUS

## 2019-10-31 MED ORDER — LIDOCAINE HCL (CARDIAC) PF 100 MG/5ML IV SOSY
PREFILLED_SYRINGE | INTRAVENOUS | Status: DC | PRN
  Administered 2019-10-31: 100 mg via INTRAVENOUS

## 2019-10-31 MED ORDER — PROPOFOL 500 MG/50ML IV EMUL
INTRAVENOUS | Status: AC
  Filled 2019-10-31: qty 50

## 2019-10-31 MED ORDER — SODIUM CHLORIDE 0.9 % IV BOLUS
500.0000 mL | Freq: Once | INTRAVENOUS | Status: AC
  Administered 2019-10-31: 500 mL via INTRAVENOUS

## 2019-10-31 MED ORDER — OXYCODONE HCL 5 MG/5ML PO SOLN: 5.0000 mg | Freq: Once | ORAL | Status: DC | PRN

## 2019-10-31 MED ORDER — NITROFURANTOIN MACROCRYSTAL 100 MG PO CAPS: 100.0000 mg | ORAL_CAPSULE | Freq: Two times a day (BID) | ORAL | 0 refills | Status: DC

## 2019-10-31 MED ORDER — FENTANYL CITRATE (PF) 100 MCG/2ML IJ SOLN
INTRAMUSCULAR | Status: AC
  Filled 2019-10-31: qty 2

## 2019-10-31 MED ORDER — DEXAMETHASONE SODIUM PHOSPHATE 10 MG/ML IJ SOLN
INTRAMUSCULAR | Status: DC | PRN
  Administered 2019-10-31: 4 mg via INTRAVENOUS

## 2019-10-31 MED ORDER — SODIUM CHLORIDE 0.9% FLUSH
3.0000 mL | Freq: Two times a day (BID) | INTRAVENOUS | Status: DC
  Administered 2019-10-31 – 2019-11-01 (×2): 3 mL via INTRAVENOUS

## 2019-10-31 MED ORDER — FENTANYL CITRATE (PF) 100 MCG/2ML IJ SOLN: 25.0000 ug | INTRAMUSCULAR | Status: DC | PRN

## 2019-10-31 MED ORDER — MORPHINE SULFATE (PF) 4 MG/ML IV SOLN
4.0000 mg | Freq: Once | INTRAVENOUS | Status: AC
  Administered 2019-10-31: 4 mg via INTRAVENOUS
  Filled 2019-10-31: qty 1

## 2019-10-31 MED ORDER — ONDANSETRON HCL 4 MG/2ML IJ SOLN: 4.0000 mg | Freq: Four times a day (QID) | INTRAMUSCULAR | Status: DC | PRN

## 2019-10-31 SURGICAL SUPPLY — 25 items
BAG DRAIN CYSTO-URO LG1000N (MISCELLANEOUS) ×3 IMPLANT
BRUSH SCRUB EZ 1% IODOPHOR (MISCELLANEOUS) ×3 IMPLANT
CATH URETL 5X70 OPEN END (CATHETERS) ×3 IMPLANT
CNTNR SPEC 2.5X3XGRAD LEK (MISCELLANEOUS)
CONT SPEC 4OZ STER OR WHT (MISCELLANEOUS)
CONT SPEC 4OZ STRL OR WHT (MISCELLANEOUS)
CONTAINER SPEC 2.5X3XGRAD LEK (MISCELLANEOUS) IMPLANT
COVER WAND RF STERILE (DRAPES) ×3 IMPLANT
FIBER LASER FLEXIVA 365 (UROLOGICAL SUPPLIES) IMPLANT
GLOVE BIO SURGEON STRL SZ7.5 (GLOVE) ×3 IMPLANT
GOWN STRL REUS W/ TWL LRG LVL3 (GOWN DISPOSABLE) ×1 IMPLANT
GOWN STRL REUS W/ TWL XL LVL3 (GOWN DISPOSABLE) ×1 IMPLANT
GOWN STRL REUS W/TWL LRG LVL3 (GOWN DISPOSABLE) ×3
GOWN STRL REUS W/TWL XL LVL3 (GOWN DISPOSABLE) ×3
GUIDEWIRE STR ZIPWIRE 035X150 (MISCELLANEOUS) ×3 IMPLANT
KIT TURNOVER CYSTO (KITS) ×3 IMPLANT
PACK CYSTO AR (MISCELLANEOUS) ×3 IMPLANT
SET CYSTO W/LG BORE CLAMP LF (SET/KITS/TRAYS/PACK) ×3 IMPLANT
SOL .9 NS 3000ML IRR  AL (IV SOLUTION) ×2
SOL .9 NS 3000ML IRR AL (IV SOLUTION) ×1
SOL .9 NS 3000ML IRR UROMATIC (IV SOLUTION) ×1 IMPLANT
STENT URET 6FRX24 CONTOUR (STENTS) ×2 IMPLANT
SURGILUBE 2OZ TUBE FLIPTOP (MISCELLANEOUS) ×3 IMPLANT
SYR 10ML LL (SYRINGE) ×3 IMPLANT
WATER STERILE IRR 1000ML POUR (IV SOLUTION) ×3 IMPLANT

## 2019-10-31 NOTE — Transfer of Care (Signed)
Immediate Anesthesia Transfer of Care Note  Patient: Brittany Cain  Procedure(s) Performed: CYSTOSCOPY/URETEROSCOPY/,left stent placement (Left )  Patient Location: PACU  Anesthesia Type:General  Level of Consciousness: awake, alert  and oriented  Airway & Oxygen Therapy: Patient Spontanous Breathing and Patient connected to nasal cannula oxygen  Post-op Assessment: Report given to RN, Post -op Vital signs reviewed and stable and Patient moving all extremities  Post vital signs: Reviewed and stable  Last Vitals:  Vitals Value Taken Time  BP 140/83 10/31/19 1758  Temp 36.4 C 10/31/19 1758  Pulse 95 10/31/19 1805  Resp 16 10/31/19 1805  SpO2 100 % 10/31/19 1805  Vitals shown include unvalidated device data.  Last Pain:  Vitals:   10/31/19 1758  TempSrc:   PainSc: 0-No pain         Complications: No apparent anesthesia complications

## 2019-10-31 NOTE — Anesthesia Procedure Notes (Signed)
Procedure Name: Intubation Date/Time: 10/31/2019 5:09 PM Performed by: Lewanda Rife, CRNA Pre-anesthesia Checklist: Patient identified, Emergency Drugs available, Suction available and Patient being monitored Patient Re-evaluated:Patient Re-evaluated prior to induction Oxygen Delivery Method: Circle system utilized Preoxygenation: Pre-oxygenation with 100% oxygen Induction Type: IV induction Ventilation: Mask ventilation without difficulty Laryngoscope Size: Miller and 2 Grade View: Grade I Tube type: Oral Tube size: 7.0 mm Number of attempts: 1 Airway Equipment and Method: Stylet and Oral airway Placement Confirmation: ETT inserted through vocal cords under direct vision,  positive ETCO2 and breath sounds checked- equal and bilateral Secured at: 22 cm Tube secured with: Tape Dental Injury: Teeth and Oropharynx as per pre-operative assessment

## 2019-10-31 NOTE — Discharge Instructions (Signed)

## 2019-10-31 NOTE — H&P (Signed)
Chief complaint: Patient developed severe left flank pain associate with fever today.  He is status post left renal lithotripsy for a partial lower pole staghorn necklace 2 days ago.  She developed a Steinstrasse.  She comes to the OR now for cystoscopy with left stent placement, ureteroscopy and possible laser lithotripsy.  Past medical history social history and review of systems dated from 10/29/19 were reviewed and are unchanged. Pertinent exam: Lungs clear to auscultation.  Cardiovascular exam indicated regular rhythm and rate.  Abdomen was soft.  Mild left flank tenderness. Impression: Left ureterolithiasis with urinary sepsis and obstruction Plan: Cystoscopy with left stent placement, ureteroscopy and possible laser lithotripsy

## 2019-10-31 NOTE — Anesthesia Post-op Follow-up Note (Signed)
Anesthesia QCDR form completed.        

## 2019-10-31 NOTE — H&P (Addendum)
History and Physical    Brittany Cain BPZ:[id number] DOB: [birth year] DOA: 10/31/2019  PCP: Marisue Ivan, MD (Confirm with patient/family/NH records and if not entered, this has to be entered at [facility] point of entry) Patient coming from: Home   I have personally briefly reviewed patient's old medical records in [facility]  Chief Complaint: Left flank pain  HPI: Brittany Cain is a 58 y.o. female with medical history significant of hypertension, nephrolithiasis presents to the emergency room with low back pain and left-sided flank pain.  Patient states that the pain started on Friday night about midnight after having a lithotripsy on Thursday afternoon.  She was given pain medications and was told to be well-hydrated.  However even with the pain medications post procedure she started having pain that progressed to constant sharp left-sided pain including left lower back and left lower quadrant with radiation from lower back to left lower quadrant.  Associated with nausea no vomiting.  She does note that it was worse with activity when she tried to walk on Friday and it eased off at rest.   Patient also has past medical history of well-controlled hypertension with treatment by losartan 100 mg daily. Patient also takes Lipitor for her dyslipidemia, and takes omeprazole and Pepcid for her GERD.  Patient denies diabetes diagnosis or sleep apnea diagnosis.  She does have a past medical history of nephrolithiasis along with a family history of the same.  Patient was told she had a 1.5 cm obstructing stone in the left ureter. Basically admitting pt for sepsis/ left sided infected stone.   ED Course: Blood pressure 119/79, pulse 93, temperature 100.2 F (37.9 C), temperature source Oral, resp. rate 16, height  (1.6 m), weight 106.6 kg, SpO2 97 %. CT revealed obstructing stone fragments in the left distal ureter with the larger one measuring 1.5 cm.  UA shows moderate leukocytes with  no nitrites.  Patient's initial blood pressure was 142/79 with a pulse of 102 attributed to her pain.  Initial lab work showed a glucose of 111, serum creatinine of 1.34 Total bili of 1.6, elevated white count of 16.2, patient was given morphine 4 mg, Zofran 4 mg, levofloxacin 500 mg.  Review of Systems: As per HPI otherwise 10 point review of systems negative.  Unacceptable ROS statements: 10 systems reviewed, Extensive (without elaboration).  Acceptable ROS statements: All others negative, All others reviewed and are negative, and All others unremarkable, with at LEAST ONE ROS documented Cant double dip - if using for HPI cant use for ROS  Past Medical History:  Diagnosis Date   Anxiety    Chicken pox    Chronic back pain    Depression    GERD (gastroesophageal reflux disease)    History of kidney stones    Hypertension    Obesity    Restless leg syndrome     Past Surgical History:  Procedure Laterality Date   ABDOMINAL HYSTERECTOMY     BACK SURGERY     lower back; metal in lower back   CHOLECYSTECTOMY     ESOPHAGOGASTRODUODENOSCOPY (EGD) WITH PROPOFOL N/A 05/24/2017   Procedure: ESOPHAGOGASTRODUODENOSCOPY (EGD) WITH PROPOFOL;  Surgeon: Christena Deem, MD;  Location: [facility] ENDOSCOPY;  Service: Endoscopy;  Laterality: N/A;   INCISIONAL HERNIA REPAIR N/A 06/01/2019   Procedure: LAPAROSCOPIC INCISIONAL HERNIA REPAIR WITH MESH;  Surgeon: Carolan Shiver, MD;  Location: [facility] ORS;  Service: General;  Laterality: N/A;     reports that she has never smoked.  She has never used smokeless tobacco. She reports that she does not drink alcohol or use drugs.  Allergies  Allergen Reactions   Aciphex [Rabeprazole] Swelling   Bactrim [Sulfamethoxazole-Trimethoprim] Diarrhea and Nausea Only   Ceftin [Cefuroxime Axetil] Hives   Protonix [Pantoprazole] Hives and Swelling   Vilazodone Hcl Hives and Other (See Comments)    Tongue swelling    Family History   Problem Relation Age of Onset   Stroke Mother    Prostate cancer Father    Breast cancer Neg Hx    Unacceptable: Noncontributory, unremarkable, or negative. Acceptable: Family history reviewed and not pertinent (If you reviewed it)  Prior to Admission medications   Medication Sig Start Date End Date Taking? Authorizing Provider  atorvastatin (LIPITOR) 40 MG tablet Take 40 mg by mouth at bedtime.    Yes [provider]  buPROPion (WELLBUTRIN SR) 200 MG 12 hr tablet Take 200 mg by mouth 2 (two) times daily.   Yes [provider]  Cholecalciferol (VITAMIN D3) 125 MCG (5000 UT) CAPS Take 5,000 Units by mouth daily.   Yes [provider]  ciprofloxacin (CIPRO) 500 MG tablet Take 500 mg by mouth 2 (two) times daily. For 10 days 10/21/19 10/31/19 Yes [provider]  cyanocobalamin 1000 MCG tablet Take 1,000 mcg by mouth daily.   Yes [provider]  famotidine (PEPCID) 20 MG tablet Take 20 mg by mouth 2 (two) times daily.   Yes [provider]  HYDROcodone-acetaminophen (NORCO) 10-325 MG tablet Take 1 tablet by mouth every 4 (four) hours as needed for moderate pain. Maximum dose per 24 hours -8 pills. 10/29/19  Yes Royston Cowper, MD  loratadine (CLARITIN) 10 MG tablet Take 10 mg by mouth daily.   Yes [provider]  losartan (COZAAR) 100 MG tablet Take 100 mg by mouth daily.   Yes [provider]  omeprazole (PRILOSEC) 20 MG capsule Take 20 mg by mouth daily.   Yes [provider]  ondansetron (ZOFRAN ODT) 8 MG disintegrating tablet Take 1 tablet (8 mg total) by mouth every 6 (six) hours as needed for nausea or vomiting. 10/29/19  Yes Royston Cowper, MD  pyridOXINE (VITAMIN B-6) 50 MG tablet Take 50 mg by mouth 2 (two) times daily.   Yes [provider]  tamsulosin (FLOMAX) 0.4 MG CAPS capsule Take 1 capsule (0.4 mg total) by mouth daily. 10/29/19  Yes Royston Cowper, MD  APAP-Pamabrom-Pyrilamine  (PAMPRIN MULTI-SYMPTOM) 500-25-15 MG TABS Take 2 tablets by mouth at bedtime.    [provider]  promethazine (PHENERGAN) 25 MG tablet Take 25 mg by mouth as directed. 10/21/19   [provider]  rOPINIRole (REQUIP) 0.25 MG tablet Take 1 tablet (0.25 mg total) by mouth at bedtime. 03/31/14   Ruben Im, PA-C    Physical Exam: Vitals:   10/31/19 1014 10/31/19 1023 10/31/19 1522  BP: (!) 142/79  119/79  Pulse: (!) 102  93  Resp: 16  16  Temp: 100.2 F (37.9 C)    TempSrc: Oral    SpO2: 100%  97%  Weight:  106.6 kg   Height:  5\' 3"  (1.6 m)     Constitutional: NAD, calm, comfortable Vitals:   10/31/19 1014 10/31/19 1023 10/31/19 1522  BP: (!) 142/79  119/79  Pulse: (!) 102  93  Resp: 16  16  Temp: 100.2 F (37.9 C)    TempSrc: Oral    SpO2: 100%  97%  Weight:  106.6  kg   Height:  5\' 3"  (1.6 m)    Eyes: PERRL, lids and conjunctivae normal ENMT: Mucous membranes are moist. Posterior pharynx clear of any exudate or lesions.Normal dentition.  Neck: normal, supple, no masses, no thyromegaly Respiratory: clear to auscultation bilaterally, no wheezing, no crackles. Normal respiratory effort. No accessory muscle use.  Cardiovascular: Regular rate and rhythm, no murmurs / rubs / gallops. No extremity edema. 2+ pedal pulses. No carotid bruits.  Abdomen: Left upper quadrant, left flank tenderness, no organomegaly, bowel sounds present. Musculoskeletal: no clubbing / cyanosis.  Skin: no rashes, lesions, ulcers. No induration Neurologic: CN 2-12 grossly intact.  Psychiatric: Normal judgment and insight. Alert and oriented x 3. Normal mood.   Labs on Admission: I have personally reviewed following labs and imaging studies  CBC: Recent Labs  Lab 10/30/19 0528 10/31/19 1032  WBC 16.3* 16.2*  HGB 13.4 12.8  HCT 40.4 37.7  MCV 88.6 85.5  PLT 308 289   Basic Metabolic Panel: Recent Labs  Lab 10/30/19 0528 10/31/19 1032  NA 135 136  K 4.0 3.6  CL 101 103    CO2 23 24  GLUCOSE 148* 111*  BUN 17 16  CREATININE 0.97 1.34*  CALCIUM 8.9 8.7*   GFR: Estimated Creatinine Clearance: 53.5 mL/min (A) (by C-G formula based on SCr of 1.34 mg/dL (H)). Liver Function Tests: Recent Labs  Lab 10/31/19 1032  AST 23  ALT 27  ALKPHOS 73  BILITOT 1.6*  PROT 7.3  ALBUMIN 4.1   Recent Labs  Lab 10/31/19 1032  LIPASE 18   No results for input(s): AMMONIA in the last 168 hours. Coagulation Profile: No results for input(s): INR, PROTIME in the last 168 hours. Cardiac Enzymes: No results for input(s): CKTOTAL, CKMB, CKMBINDEX, TROPONINI in the last 168 hours. BNP (last 3 results) No results for input(s): PROBNP in the last 8760 hours. HbA1C: No results for input(s): HGBA1C in the last 72 hours. CBG: No results for input(s): GLUCAP in the last 168 hours. Lipid Profile: No results for input(s): CHOL, HDL, LDLCALC, TRIG, CHOLHDL, LDLDIRECT in the last 72 hours. Thyroid Function Tests: No results for input(s): TSH, T4TOTAL, FREET4, T3FREE, THYROIDAB in the last 72 hours. Anemia Panel: No results for input(s): VITAMINB12, FOLATE, FERRITIN, TIBC, IRON, RETICCTPCT in the last 72 hours. Urine analysis:    Component Value Date/Time   COLORURINE YELLOW (A) 10/31/2019 1032   APPEARANCEUR HAZY (A) 10/31/2019 1032   APPEARANCEUR Hazy 04/23/2014 1234   LABSPEC 1.017 10/31/2019 1032   LABSPEC 1.006 04/23/2014 1234   PHURINE 5.0 10/31/2019 1032   GLUCOSEU NEGATIVE 10/31/2019 1032   GLUCOSEU Negative 04/23/2014 1234   HGBUR LARGE (A) 10/31/2019 1032   BILIRUBINUR NEGATIVE 10/31/2019 1032   BILIRUBINUR Negative 04/23/2014 1234   KETONESUR 5 (A) 10/31/2019 1032   PROTEINUR 30 (A) 10/31/2019 1032   UROBILINOGEN 1.0 06/26/2013 2049   NITRITE NEGATIVE 10/31/2019 1032   LEUKOCYTESUR MODERATE (A) 10/31/2019 1032   LEUKOCYTESUR 1+ 04/23/2014 1234    Radiological Exams on Admission: Ct Renal Stone Study  Result Date: 10/30/2019 CLINICAL DATA:  Left  flank pain. Status post lithotripsy. EXAM: CT ABDOMEN AND PELVIS WITHOUT CONTRAST TECHNIQUE: Multidetector CT imaging of the abdomen and pelvis was performed following the standard protocol without IV contrast. COMPARISON:  CT of the abdomen and pelvis 07/27/2019 FINDINGS: Lower chest: The lung bases are clear without focal nodule or mass. The heart size is normal. Hepatobiliary: There is diffuse fatty infiltration of the liver. No discrete  lesions are present. Cholecystectomy is noted. Common bile duct is within normal limits. Pancreas: Unremarkable. No pancreatic ductal dilatation or surrounding inflammatory changes. Spleen: Normal in size without focal abnormality. Adrenals/Urinary Tract: The adrenal glands are normal bilaterally. A punctate nonobstructing stone is present at the lower pole of the right kidney. The right kidney and ureter are otherwise within normal limits. 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 There are nonobstructing stone fragments within dependent calices. Moderate left-sided hydronephrosis is present. The left ureter is dilated to the level of obstructing stone fragments in the lower ureter stone fragments measure over 1.5 cm in the distal ureter. Additional layering stones are present dependently within the urinary bladder. Stomach/Bowel: The stomach and duodenum are within normal limits. Small bowel is unremarkable. The appendix is visualized and within normal limits. The ascending and transverse colon are normal. Vascular/Lymphatic: No significant vascular findings are present. No enlarged abdominal or pelvic lymph nodes. Reproductive: Status post hysterectomy. No adnexal masses. Other: No abdominal wall hernia or abnormality. No abdominopelvic ascites. Musculoskeletal: Lumbar spine fusion is solid at L4-5 and L5-S1. Adjacent level disease is present at L3-4. No acute osseous abnormalities are present. No focal lytic or blastic  lesions are present. The hips are located and within normal limits. IMPRESSION: 1. Obstructing stone fragments (steinstrasse) in the distal left ureter measuring up to 1.5 cm. 2. Moderate left-sided hydroureteronephrosis. 3. Additional nonobstructing stone fragments are present within dependent calices of the left kidney. 4. Layering stone fragments are present dependently within the urinary bladder. 5. Punctate nonobstructing stone at the lower pole of the right kidney. 6. Hepatic steatosis. 7. Cholecystectomy and hysterectomy. 8. Lumbar spine fusion at L4-5 and L5-S1. Electronically Signed   By: Marin Roberts M.D.   On: 10/30/2019 06:20    EKG: Independently reviewed.  Normal sinus rhythm 71 EKG from June 2020  Assessment/Plan Principal Problem:   Sepsis [facility]) Active Problems:   Ureterolithiasis   Essential hypertension   Hyperglycemia   Obesity   So overall patient is a 58 year old Caucasian female with past medical history of nephrolithiasis presenting to the emergency room with fevers chills low back pain radiating to left lower quadrant and elevated white count admitted today for management of her sepsis and her left ureteral stone with obstruction.   Sepsis:  Pt meets sepsis criteria with fever/ tachycardia/ source and white count; Patient received Levaquin 500 mg IV as 1 in the emergency room. Pt needs to be started on abx regimen and day team to follow creatinine and reorder Levaquin.  We will collect blood and urine cultures.  Ureterolithiasis with obstruction: Pt is on ivf and is to have ureteral stent and lithotripsy.  Urology on board.  Essential hypertension: Due to patient's losartan 100 mg daily. Cardiac diet.   Hyperglycemia/obesity: Chart review shows patient has had read to him elevated blood glucose levels on blood work with her risk of diabetes type 2 secondary to her obesity we will check an A1c and start therapy if indicated.   AKI: Patient has an  elevated creatinine of 1.  34 attributed secondary to her nephrolithiasis and infection.   Previous creatinine of 0.97 yesterday.  We will continue with IV fluid hydration and follow resolution.   Dyslipidemia: Patient to continue her Lipitor once post procedure we will resume her home regimen.   DVT prophylaxis: Heparin (Lovenox/Heparin/SCD's/anticoagulated/None (if comfort care) Code Status: Full code (Full/Partial (specify details) Family Communication: None at bedside (  Specify name, relationship. Do not write "discussed with patient". Specify tel # if discussed over the phone) Disposition Plan: Anticipate discharge on Monday (specify when and where you expect patient to be discharged) Consults called: Urology (with names) Admission status: Observation (inpatient / obs / tele / medical floor / SDU)   Gertha Calkin MD [facility]  If 7PM-7AM, please contact night-coverage [email or web address] Password [facility]  10/31/2019, 4:31 PM   Received call from urologist about patient discharge plan with recommendations of antibiotic for discharge.  Patient is to go home on Macrodantin, Nucynta for pain and follow-up with her urologist on outpatient basis per scheduled appointment.  In the meantime patient will be on the medicine service we will follow resolution of sepsis, AKI, hyperglycemia.

## 2019-10-31 NOTE — ED Triage Notes (Signed)
Patient presents to the ED with left sided lower abdominal pain.  Patient was seen in the ED yesterday and sent home but told if her pain became severe to return.  Patient states her pain has been unbearable for the past 3 hours.  Patient appears very uncomfortable in triage.  Patient had lithotripsy performed on Thursday.

## 2019-10-31 NOTE — Anesthesia Postprocedure Evaluation (Signed)
Anesthesia Post Note  Patient: JAMALA KOHEN  Procedure(s) Performed: ,left stent placement,left retrograde pyelogram,cystoscopy (Left )  Patient location during evaluation: PACU Anesthesia Type: General Level of consciousness: awake and alert Pain management: pain level controlled Vital Signs Assessment: post-procedure vital signs reviewed and stable Respiratory status: spontaneous breathing, nonlabored ventilation, respiratory function stable and patient connected to nasal cannula oxygen Cardiovascular status: blood pressure returned to baseline and stable Postop Assessment: no apparent nausea or vomiting Anesthetic complications: no     Last Vitals:  Vitals:   10/31/19 1857 10/31/19 1931  BP: (!) 158/97 (!) 142/68  Pulse: 91 84  Resp: 18 18  Temp: 37.1 C 37.1 C  SpO2: 100% 99%    Last Pain:  Vitals:   10/31/19 1931  TempSrc: Oral  PainSc:                  Precious Haws Matisha Termine

## 2019-10-31 NOTE — Op Note (Signed)
Preoperative diagnosis: Left ureteral Steinstrasse with urinary sepsis  Postoperative diagnosis: Same  Procedure: 1.  Left ureteral stent placement                      2.  Left retrograde pyelography                      3.  Fluoroscopy  Surgeon: Otelia Limes. Yves Dill MD  Anesthesia: General  Indications:See the history and physical. After informed consent the above procedure(s) were requested     Technique and findings: After adequate general anesthesia had been obtained the patient was placed into dorsal lithotomy position and the perineum was prepped and draped in the usual fashion.  Fluoroscopy revealed a distal left ureteral Steinstrasse measuring 1 centimeter in length.  At this point the 21 French cystoscope was pulled to the camera and placed into the bladder.  Bladder was thoroughly inspected.  No bladder mucosal lesions were identified.  A 6 French open-ended ureteral catheter was placed into the left orifice and retrograde pyelography was performed.  Steinstrasse was confirmed with proximal hydronephrosis.  A 0.035 Glidewire was then passed through the 6 [place] open-ended ureteral catheter and passed beyond the stone fragments and curled into the renal pelvis.  The ureteral catheter was then removed taking care leave the guidewire in position.  A 6 French by 24 cm double-pigtail ureteral catheter with retrieval suture passed over the guidewire and positioned in the ureter under fluoroscopic guidance.  The guidewire was then removed taking care to leave the stent in position.  The bladder was then drained and the cystoscope was removed.  10 cc of viscous Xylocaine was instilled within the urethra and bladder.  A B&O suppository was placed.  Procedure was then terminated and patient transferred to the recovery room in stable condition.

## 2019-10-31 NOTE — ED Notes (Signed)
COVID swab walked to lab and staff [place] notified rapid test ordered.  

## 2019-10-31 NOTE — Anesthesia Preprocedure Evaluation (Signed)
Anesthesia Evaluation  Patient identified by MRN, date of birth, ID band Patient awake    Reviewed: Allergy & Precautions, H&P , NPO status , Patient's Chart, lab work & pertinent test results  History of Anesthesia Complications Negative for: history of anesthetic complications  Airway Mallampati: III  TM Distance: <3 FB Neck ROM: limited    Dental  (+) Chipped   Pulmonary neg pulmonary ROS, neg shortness of breath,           Cardiovascular Exercise Tolerance: Good hypertension, (-) angina(-) Past MI and (-) DOE      Neuro/Psych PSYCHIATRIC DISORDERS negative neurological ROS  negative psych ROS   GI/Hepatic Neg liver ROS, GERD  Medicated and Controlled,  Endo/Other  negative endocrine ROS  Renal/GU      Musculoskeletal   Abdominal   Peds  Hematology negative hematology ROS (+)   Anesthesia Other Findings Sepsis  Past Medical History: No date: Anxiety No date: Chicken pox No date: Chronic back pain No date: Depression No date: GERD (gastroesophageal reflux disease) No date: History of kidney stones No date: Hypertension No date: Obesity No date: Restless leg syndrome  Past Surgical History: No date: ABDOMINAL HYSTERECTOMY No date: BACK SURGERY     Comment:  lower back; metal in lower back No date: CHOLECYSTECTOMY 05/24/2017: ESOPHAGOGASTRODUODENOSCOPY (EGD) WITH PROPOFOL; N/A     Comment:  Procedure: ESOPHAGOGASTRODUODENOSCOPY (EGD) WITH               PROPOFOL;  Surgeon: Lollie Sails, MD;  Location:               [facility] ENDOSCOPY;  Service: Endoscopy;  Laterality: N/A; 06/01/2019: INCISIONAL HERNIA REPAIR; N/A     Comment:  Procedure: LAPAROSCOPIC INCISIONAL HERNIA REPAIR WITH               MESH;  Surgeon: Herbert Pun, MD;  Location:               [facility] ORS;  Service: General;  Laterality: N/A;  BMI    Body Mass Index: 41.63 kg/m      Reproductive/Obstetrics negative OB ROS                              Anesthesia Physical Anesthesia Plan  ASA: IV and emergent  Anesthesia Plan: General ETT   Post-op Pain Management:    Induction: Intravenous  PONV Risk Score and Plan: Ondansetron, Dexamethasone, Midazolam and Treatment may vary due to age or medical condition  Airway Management Planned: Oral ETT  Additional Equipment:   Intra-op Plan:   Post-operative Plan: Extubation in OR  Informed Consent: I have reviewed the patients History and Physical, chart, labs and discussed the procedure including the risks, benefits and alternatives for the proposed anesthesia with the patient or authorized representative who has indicated his/her understanding and acceptance.     Dental Advisory Given  Plan Discussed with: Anesthesiologist, CRNA and Surgeon  Anesthesia Plan Comments: (Patient consented for risks of anesthesia including but not limited to:  - adverse reactions to medications - damage to teeth, lips or other oral mucosa - sore throat or hoarseness - Damage to heart, brain, lungs or loss of life  Patient voiced understanding.)        Anesthesia Quick Evaluation

## 2019-10-31 NOTE — ED Provider Notes (Signed)
[facility] Emergency Department Provider Note  ____________________________________________  Time seen: Approximately 12:59 PM  I have reviewed the triage vital signs and the nursing notes.   HISTORY  Chief Complaint Abdominal Pain    HPI Brittany Cain is a 58 y.o. female who presents the emergency department for nausea, left flank pain, decreased urination, fever.  Patient states that she was seen in the department yesterday for flank pain after lithotripsy.  Patient had a 1.5 cm obstructing stone in the left ureter.  Patient had improved symptomatically after medication until this morning.  Patient states that she is having intense left-sided flank pain, nausea with no emesis, fever.  Patient states that the fever is subjective as she has not taken her temperature at home.  She denies any other complaint of nasal congestion, sore throat, cough, shortness of breath.  Patient states that she has had decreased urination.  She states that she is drinking "extra water" at home but she has not been urinating even as normal.  Patient denies any visible hematuria in her urine.  No other complaints at this time.  Patient was seen here yesterday for similar complaint.  CT revealed obstructing stone fragments in the distal left ureter.  Largest fragment is up to 1.5 cm.  Patient denied any fever at that time.  White blood cell count was slightly elevated.  Urine did have evidence of moderate leuks but no bacteria or nitrites.        Past Medical History:  Diagnosis Date  . Anxiety   . Chicken pox   . Chronic back pain   . Depression   . GERD (gastroesophageal reflux disease)   . History of kidney stones   . Hypertension   . Obesity   . Restless leg syndrome     Patient Active Problem List   Diagnosis Date Noted  . Major depressive disorder, recurrent episode, severe ([place]) 04/09/2014  . MDD (major depressive disorder), recurrent episode, severe ([place])  04/08/2014  . GAD (generalized anxiety disorder) 03/25/2014    Past Surgical History:  Procedure Laterality Date  . ABDOMINAL HYSTERECTOMY    . BACK SURGERY     lower back; metal in lower back  . CHOLECYSTECTOMY    . ESOPHAGOGASTRODUODENOSCOPY (EGD) WITH PROPOFOL N/A 05/24/2017   Procedure: ESOPHAGOGASTRODUODENOSCOPY (EGD) WITH PROPOFOL;  Surgeon: Lollie Sails, MD;  Location: [facility] ENDOSCOPY;  Service: Endoscopy;  Laterality: N/A;  . INCISIONAL HERNIA REPAIR N/A 06/01/2019   Procedure: LAPAROSCOPIC INCISIONAL HERNIA REPAIR WITH MESH;  Surgeon: Herbert Pun, MD;  Location: [facility] ORS;  Service: General;  Laterality: N/A;    Prior to Admission medications   Medication Sig Start Date End Date Taking? Authorizing Provider  atorvastatin (LIPITOR) 40 MG tablet Take 40 mg by mouth at bedtime.    Yes [provider]  buPROPion (WELLBUTRIN SR) 200 MG 12 hr tablet Take 200 mg by mouth 2 (two) times daily.   Yes [provider]  Cholecalciferol (VITAMIN D3) 125 MCG (5000 UT) CAPS Take 5,000 Units by mouth daily.   Yes [provider]  ciprofloxacin (CIPRO) 500 MG tablet Take 500 mg by mouth 2 (two) times daily. For 10 days 10/21/19 10/31/19 Yes [provider]  cyanocobalamin 1000 MCG tablet Take 1,000 mcg by mouth daily.   Yes [provider]  famotidine (PEPCID) 20 MG tablet Take 20 mg by mouth 2 (two) times daily.   Yes [provider]  HYDROcodone-acetaminophen (NORCO) 10-325 MG tablet Take 1  tablet by mouth every 4 (four) hours as needed for moderate pain. Maximum dose per 24 hours -8 pills. 10/29/19  Yes Orson ApeWolff, Michael R, MD  loratadine (CLARITIN) 10 MG tablet Take 10 mg by mouth daily.   Yes [provider]  losartan (COZAAR) 100 MG tablet Take 100 mg by mouth daily.   Yes [provider]  omeprazole (PRILOSEC) 20 MG capsule Take 20 mg by mouth daily.   Yes [provider]  ondansetron (ZOFRAN ODT) 8 MG  disintegrating tablet Take 1 tablet (8 mg total) by mouth every 6 (six) hours as needed for nausea or vomiting. 10/29/19  Yes Orson ApeWolff, Michael R, MD  pyridOXINE (VITAMIN B-6) 50 MG tablet Take 50 mg by mouth 2 (two) times daily.   Yes [provider]  tamsulosin (FLOMAX) 0.4 MG CAPS capsule Take 1 capsule (0.4 mg total) by mouth daily. 10/29/19  Yes Orson ApeWolff, Michael R, MD  APAP-Pamabrom-Pyrilamine (PAMPRIN MULTI-SYMPTOM) 500-25-15 MG TABS Take 2 tablets by mouth at bedtime.    [provider]  promethazine (PHENERGAN) 25 MG tablet Take 25 mg by mouth as directed. 10/21/19   [provider]  rOPINIRole (REQUIP) 0.25 MG tablet Take 1 tablet (0.25 mg total) by mouth at bedtime. 03/31/14   Verne SpurrMashburn, Neil T, PA-C    Allergies Aciphex [rabeprazole], Bactrim [sulfamethoxazole-trimethoprim], Ceftin [cefuroxime axetil], Protonix [pantoprazole], and Vilazodone hcl  Family History  Problem Relation Age of Onset  . Stroke Mother   . Prostate cancer Father   . Breast cancer Neg Hx     Social History Social History   Tobacco Use  . Smoking status: Never Smoker  . Smokeless tobacco: Never Used  Substance Use Topics  . Alcohol use: No  . Drug use: No     Review of Systems  Constitutional: No fever/chills Eyes: No visual changes. No discharge ENT: No upper respiratory complaints. Cardiovascular: no chest pain. Respiratory: no cough. No SOB. Gastrointestinal: No abdominal pain.  Positive nausea, no vomiting.  No diarrhea.  No constipation. Genitourinary: Positive for left flank pain with known stone.  Decreased urination.  Negative for dysuria. No hematuria Musculoskeletal: Negative for musculoskeletal pain. Skin: Negative for rash, abrasions, lacerations, ecchymosis. Neurological: Negative for headaches, focal weakness or numbness. 10-point ROS otherwise negative.  ____________________________________________   PHYSICAL EXAM:  VITAL SIGNS: ED Triage Vitals  Enc  Vitals Group     BP 10/31/19 1014 (!) 142/79     Pulse Rate 10/31/19 1014 (!) 102     Resp 10/31/19 1014 16     Temp 10/31/19 1014 100.2 F (37.9 C)     Temp Source 10/31/19 1014 Oral     SpO2 10/31/19 1014 100 %     Weight 10/31/19 1023 235 lb (106.6 kg)     Height 10/31/19 1023 5\' 3"  (1.6 m)     Head Circumference --      Peak Flow --      Pain Score 10/31/19 1023 10     Pain Loc --      Pain Edu? --      Excl. in GC? --      Constitutional: Alert and oriented. Well appearing and in no acute distress. Eyes: Conjunctivae are normal. PERRL. EOMI. Head: Atraumatic. ENT:      Ears:       Nose: No congestion/rhinnorhea.      Mouth/Throat: Mucous membranes are moist.  Neck: No stridor.    Cardiovascular: Normal rate, regular rhythm. Normal S1 and S2.  Good peripheral circulation. Respiratory: Normal respiratory effort without tachypnea or retractions. Lungs CTAB. Good air entry to the bases with no decreased or absent breath sounds. Gastrointestinal: Bowel sounds 4 quadrants.  Soft to palpation all quadrants.  Patient is mildly diffusely tender to palpation along the left abdomen.  No point specific tenderness.  No right-sided tenderness to palpation.  No guarding or rigidity. No palpable masses. No distention. No appreciable CVA tenderness. Musculoskeletal: Full range of motion to all extremities. No gross deformities appreciated. Neurologic:  Normal speech and language. No gross focal neurologic deficits are appreciated.  Skin:  Skin is warm, dry and intact. No rash noted. Psychiatric: Mood and affect are normal. Speech and behavior are normal. Patient exhibits appropriate insight and judgement.   ____________________________________________   LABS (all labs ordered are listed, but only abnormal results are displayed)  Labs Reviewed  COMPREHENSIVE METABOLIC PANEL - Abnormal; Notable for the following components:      Result Value   Glucose, Bld 111 (*)    Creatinine, Ser  1.34 (*)    Calcium 8.7 (*)    Total Bilirubin 1.6 (*)    GFR calc non Af Amer 44 (*)    GFR calc Af Amer 50 (*)    All other components within normal limits  CBC - Abnormal; Notable for the following components:   WBC 16.2 (*)    All other components within normal limits  URINALYSIS, COMPLETE (UACMP) WITH MICROSCOPIC - Abnormal; Notable for the following components:   Color, Urine YELLOW (*)    APPearance HAZY (*)    Hgb urine dipstick LARGE (*)    Ketones, ur 5 (*)    Protein, ur 30 (*)    Leukocytes,Ua MODERATE (*)    RBC / HPF >50 (*)    All other components within normal limits  URINE CULTURE  CULTURE, BLOOD (ROUTINE X 2)  CULTURE, BLOOD (ROUTINE X 2)  SARS CORONAVIRUS 2 BY RT PCR (HOSPITAL ORDER, PERFORMED IN [facility])  LIPASE, BLOOD   ____________________________________________  EKG   ____________________________________________  RADIOLOGY  Imaging from 10/30/2019 was reviewed today.  Patient has an obstructing 1.5 cm stone fragment in the distal left ureter.  Mild left-sided hydroureteronephrosis.  IMPRESSION: 1. Obstructing stone fragments (steinstrasse) in the distal left ureter measuring up to 1.5 cm. 2. Moderate left-sided hydroureteronephrosis. 3. Additional nonobstructing stone fragments are present within dependent calices of the left kidney. 4. Layering stone fragments are present dependently within the urinary bladder. 5. Punctate nonobstructing stone at the lower pole of the right kidney. 6. Hepatic steatosis. 7. Cholecystectomy and hysterectomy. 8. Lumbar spine fusion at L4-5 and L5-S1.  No results found.  ____________________________________________    PROCEDURES  Procedure(s) performed:    .Critical Care Performed by: Racheal Patches, PA-C Authorized by: Racheal Patches, PA-C   Critical care provider statement:    Critical care time (minutes):  30   Critical care time was exclusive of:  Separately  billable procedures and treating other patients and teaching time   Critical care was necessary to treat or prevent imminent or life-threatening deterioration of the following conditions:  Sepsis (infected ureterolithiasis)   Critical care was time spent personally by me on the following activities:  Obtaining history from patient or surrogate, evaluation of patient's response to treatment, examination of patient, development of treatment plan with patient or surrogate, discussions with consultants, ordering and performing treatments and interventions, ordering and review of laboratory studies, ordering and review of radiographic  studies and re-evaluation of patient's condition      Medications  fentaNYL (SUBLIMAZE) injection 50 mcg (50 mcg Intravenous Given 10/31/19 1032)  morphine 4 MG/ML injection 4 mg (has no administration in time range)  ondansetron (ZOFRAN) injection 4 mg (has no administration in time range)  levofloxacin (LEVAQUIN) IVPB 500 mg (has no administration in time range)  0.9 %  sodium chloride infusion (has no administration in time range)  sodium chloride 0.9 % bolus 500 mL (has no administration in time range)     ____________________________________________   INITIAL IMPRESSION / ASSESSMENT AND PLAN / ED COURSE  Pertinent labs & imaging results that were available during my care of the patient were reviewed by me and considered in my medical decision making (see chart for details).  Review of the [place] CSRS was performed in accordance of the [facility] prior to dispensing any controlled drugs.  Clinical Course as of Oct 31 1443  Sat Oct 31, 2019  1310 Patient arrives to the emergency department with worsening left-sided flank pain, nausea with no emesis, decreased urination.  Patient has a known obstructing stone in the left ureter.  This is 1.5 cm.  Patient reports that she has had decreased urination even though she is drinking more water than normal.  Patient is also  endorsing fever.  Patient states that prior to arriving in the emergency department she had taken her Vicodin with Tylenol.  Patient did have a temperature of 100.2 F and was slightly tachycardic.  Patient is having mild left-sided tenderness to palpation but no CVA tenderness.  Given patient's elevated white blood cell count, pain, symptoms I am concerned at this time patient may have an infected kidney stone.   [JC]  1341 I discussed the patient with her urologist, Dr.Wolff who advises that the patient can be admitted, IV antibiotics and urology will present to evaluate the patient and take the patient to the OR for stent placement.   [JC]    Clinical Course User Index [JC] Cuthriell, Delorise Royals, PA-C          Patient's diagnosis is consistent with infected kidney stone.  Patient presented to emergency department with increased left flank pain, fever, decreased urinary output.  Patient had recent lithotripsy but had a ongoing retained 1.5 cm stone in the left ureter.  Patient was seen in this department yesterday, but had symptomatic improvement after medication.  Patient followed up via telephone with her urologist yesterday.  She states that she was doing "good" yesterday but woke up this morning with increased pain, decreased urinary output, fever.  Patient presented to emergency department for evaluation.  Patient has ongoing elevated white blood cell count, leukocytes in the urine, slight elevation in her creatinine.  Given this finding with now decreased urination, fever, increased pain concern for infected kidney stone exist.  I discussed the patient with her urologist, Dr. Evelene Croon.  Urology advises that the patient would be a good candidate for admission, stent placement.  Patient is started on IV fluids, antibiotics here in the emergency department.  Pain control is ongoing in the emergency department as well.  Patient care will be transferred to urology for stent placement.       ____________________________________________  FINAL CLINICAL IMPRESSION(S) / ED DIAGNOSES  Final diagnoses:  Ureterolithiasis  Hydroureteronephrosis  Acute cystitis with hematuria      NEW MEDICATIONS STARTED DURING THIS VISIT:  ED Discharge Orders    None  This chart was dictated using voice recognition software/Dragon. Despite best efforts to proofread, errors can occur which can change the meaning. Any change was purely unintentional.    Racheal Patches, PA-C 10/31/19 1444    Jene Every, MD 10/31/19 1450

## 2019-10-31 NOTE — Progress Notes (Signed)
Ice chips given to patient for tickle in her throat.

## 2019-10-31 NOTE — Progress Notes (Signed)
15 minute call to floor. 

## 2019-10-31 NOTE — Progress Notes (Signed)
Dr. Yves Dill talking with hospitalist on the phone in reference To admission.

## 2019-11-01 ENCOUNTER — Encounter: Payer: Self-pay | Admitting: Urology

## 2019-11-01 DIAGNOSIS — R739 Hyperglycemia, unspecified: Secondary | ICD-10-CM | POA: Diagnosis not present

## 2019-11-01 DIAGNOSIS — I10 Essential (primary) hypertension: Secondary | ICD-10-CM | POA: Diagnosis not present

## 2019-11-01 DIAGNOSIS — N200 Calculus of kidney: Secondary | ICD-10-CM

## 2019-11-01 DIAGNOSIS — A419 Sepsis, unspecified organism: Secondary | ICD-10-CM | POA: Diagnosis not present

## 2019-11-01 LAB — HEMOGLOBIN A1C
Hgb A1c MFr Bld: 5.5 % (ref 4.8–5.6)
Mean Plasma Glucose: 111.15 mg/dL

## 2019-11-01 LAB — HIV ANTIBODY (ROUTINE TESTING W REFLEX): HIV Screen 4th Generation wRfx: NONREACTIVE

## 2019-11-01 MED ORDER — LEVOFLOXACIN IN D5W 500 MG/100ML IV SOLN: 500.0000 mg | INTRAVENOUS | Status: DC

## 2019-11-01 NOTE — Progress Notes (Signed)
Brittany Cain to be D/C'd home with husband per MD order.  Discussed prescriptions and follow up appointments with the patient. Prescriptions given to patient, medication list explained in detail. Pt verbalized understanding.  Allergies as of 11/01/2019       Reactions   Aciphex [rabeprazole] Swelling   Bactrim [sulfamethoxazole-trimethoprim] Diarrhea, Nausea Only   Ceftin [cefuroxime Axetil] Hives   Protonix [pantoprazole] Hives, Swelling   Vilazodone Hcl Hives, Other (See Comments)   Tongue swelling        Medication List     TAKE these medications    atorvastatin 40 MG tablet Commonly known as: LIPITOR Take 40 mg by mouth at bedtime.   buPROPion 200 MG 12 hr tablet Commonly known as: WELLBUTRIN SR Take 200 mg by mouth 2 (two) times daily.   cyanocobalamin 1000 MCG tablet Take 1,000 mcg by mouth daily.   famotidine 20 MG tablet Commonly known as: PEPCID Take 20 mg by mouth 2 (two) times daily.   HYDROcodone-acetaminophen 10-325 MG tablet Commonly known as: NORCO Take 1 tablet by mouth every 4 (four) hours as needed for moderate pain. Maximum dose per 24 hours -8 pills.   loratadine 10 MG tablet Commonly known as: CLARITIN Take 10 mg by mouth daily.   losartan 100 MG tablet Commonly known as: COZAAR Take 100 mg by mouth daily.   nitrofurantoin 100 MG capsule Commonly known as: Macrodantin Take 1 capsule (100 mg total) by mouth 2 (two) times daily.   Nucynta 50 MG tablet Generic drug: tapentadol Take 1 tablet (50 mg total) by mouth every 6 (six) hours as needed for severe pain. 1 TO 2 TABS Q 6 HOURS PRN PAIN   omeprazole 20 MG capsule Commonly known as: PRILOSEC Take 20 mg by mouth daily.   ondansetron 8 MG disintegrating tablet Commonly known as: Zofran ODT Take 1 tablet (8 mg total) by mouth every 6 (six) hours as needed for nausea or vomiting.   Pamprin Multi-Symptom 500-25-15 MG Tabs Generic drug: APAP-Pamabrom-Pyrilamine Take 2 tablets by mouth  at bedtime.   promethazine 25 MG tablet Commonly known as: PHENERGAN Take 25 mg by mouth as directed.   pyridOXINE 50 MG tablet Commonly known as: VITAMIN B-6 Take 50 mg by mouth 2 (two) times daily.   rOPINIRole 0.25 MG tablet Commonly known as: REQUIP Take 1 tablet (0.25 mg total) by mouth at bedtime.   tamsulosin 0.4 MG Caps capsule Commonly known as: FLOMAX Take 1 capsule (0.4 mg total) by mouth daily.   Vitamin D3 125 MCG (5000 UT) Caps Take 5,000 Units by mouth daily.       ASK your doctor about these medications    ciprofloxacin 500 MG tablet Commonly known as: CIPRO Take 500 mg by mouth 2 (two) times daily. For 10 days Ask about: Should I take this medication?        Vitals:   11/01/19 0615 11/01/19 1243  BP: 133/67 122/75  Pulse: 68 69  Resp:  16  Temp: 97.7 F (36.5 C) 98.6 F (37 C)  SpO2: 97% 100%    Skin clean, dry and intact without evidence of skin break down, no evidence of skin tears noted. IV catheter discontinued intact. Site without signs and symptoms of complications. Dressing and pressure applied. Pt denies pain at this time. No complaints noted.  An After Visit Summary was printed and given to the patient. Patient escorted via [place], and D/C home via private auto.  [place] A Dajiah Kooi

## 2019-11-01 NOTE — Progress Notes (Signed)
1 Day Post-Op Subjective: Patient reports pain control good.Wants to go home.  Objective: Vital signs in last 24 hours: Temp:  [97.6 F (36.4 C)-98.7 F (37.1 C)] 98.6 F (37 C) (11/15 1243) Pulse Rate:  [68-102] 69 (11/15 1243) Resp:  [10-18] 16 (11/15 1243) BP: (119-158)/(66-97) 122/75 (11/15 1243) SpO2:  [83 %-100 %] 100 % (11/15 1243)  Intake/Output from previous day: 11/14 0701 - 11/15 0700 In: 1767 [I.V.:1767] Out: 1850 [Urine:1850] Intake/Output this shift: Total I/O In: 3 [I.V.:3] Out: -   Physical Exam:  General:alert GI: not done Female genitalia: not done not indicated Resp: not done  Lab Results: Recent Labs    10/30/19 0528 10/31/19 1032 10/31/19 2053  HGB 13.4 12.8 12.4  HCT 40.4 37.7 36.9   BMET Recent Labs    10/30/19 0528 10/31/19 1032  NA 135 136  K 4.0 3.6  CL 101 103  CO2 23 24  GLUCOSE 148* 111*  BUN 17 16  CREATININE 0.97 1.34*  CALCIUM 8.9 8.7*   No results for input(s): LABPT, INR in the last 72 hours. No results for input(s): LABURIN in the last 72 hours. Results for orders placed or performed during the hospital encounter of 10/31/19  SARS Coronavirus 2 by RT PCR (hospital order, performed in [facility] hospital lab) Nasopharyngeal Nasopharyngeal Swab     Status: None   Collection Time: 10/31/19  1:49 PM   Specimen: Nasopharyngeal Swab  Result Value Ref Range Status   SARS Coronavirus 2 NEGATIVE NEGATIVE Final    Comment: (NOTE) If result is NEGATIVE SARS-CoV-2 target nucleic acids are NOT DETECTED. The SARS-CoV-2 RNA is generally detectable in upper and lower  respiratory specimens during the acute phase of infection. The lowest  concentration of SARS-CoV-2 viral copies this assay can detect is 250  copies / mL. A negative result does not preclude SARS-CoV-2 infection  and should not be used as the sole basis for treatment or other  patient management decisions.  A negative result may occur with  improper specimen  collection / handling, submission of specimen other  than nasopharyngeal swab, presence of viral mutation(s) within the  areas targeted by this assay, and inadequate number of viral copies  (<250 copies / mL). A negative result must be combined with clinical  observations, patient history, and epidemiological information. If result is POSITIVE SARS-CoV-2 target nucleic acids are DETECTED. The SARS-CoV-2 RNA is generally detectable in upper and lower  respiratory specimens dur ing the acute phase of infection.  Positive  results are indicative of active infection with SARS-CoV-2.  Clinical  correlation with patient history and other diagnostic information is  necessary to determine patient infection status.  Positive results do  not rule out bacterial infection or co-infection with other viruses. If result is PRESUMPTIVE POSTIVE SARS-CoV-2 nucleic acids MAY BE PRESENT.   A presumptive positive result was obtained on the submitted specimen  and confirmed on repeat testing.  While 2019 novel coronavirus  (SARS-CoV-2) nucleic acids may be present in the submitted sample  additional confirmatory testing may be necessary for epidemiological  and / or clinical management purposes  to differentiate between  SARS-CoV-2 and other Sarbecovirus currently known to infect humans.  If clinically indicated additional testing with an alternate test  methodology [phone]) is advised. The SARS-CoV-2 RNA is generally  detectable in upper and lower respiratory sp ecimens during the acute  phase of infection. The expected result is Negative. Fact Sheet for Patients:  StrictlyIdeas.no Fact Sheet for Healthcare  Providers: https://pope.com/ This test is not yet approved or cleared by the [place] and has been authorized for detection and/or diagnosis of SARS-CoV-2 by FDA under an Emergency Use Authorization (EUA).  This EUA will remain in effect  (meaning this test can be used) for the duration of the COVID-19 declaration under Section 564(b)(1) of the Act, 21 U.S.C. section 360bbb-3(b)(1), unless the authorization is terminated or revoked sooner. Performed at [facility], [address] [place], [place] [address]     Studies/Results: Dg Or Urology Cysto Image ([facility] Only)  Result Date: 10/31/2019 There is no interpretation for this exam.  This order is for images obtained during a surgical procedure.  Please See "Surgeries" Tab for more information regarding the procedure.    Assessment/Plan: 1 Day Post-Op Procedure(s) (LRB): ,left stent placement,left retrograde pyelogram,cystoscopy (Left)  Doing well. OK for discharge from urology aspect. Follow up in the office 11/09/19   LOS: 0 days   Orson Ape 11/01/2019, 1:11 PM

## 2019-11-01 NOTE — Discharge Summary (Signed)
Physician Discharge Summary  Brittany Cain ZOX:[id number] DOB: 23-Feb-1961 DOA: 10/31/2019  PCP: Marisue IvanLinthavong, Kanhka, MD  Admit date: 10/31/2019 Discharge date: 11/01/2019  Admitted From: Inpatient Disposition: home  Recommendations for Outpatient Follow-up:  1. Follow up with PCP in 1-2 weeks 2. F/up with urol per MD:  Home Health:No Equipment/Devices:NONE  Discharge Condition:Stable CODE STATUS:Full code Diet recommendation: Regular healthy diet  Brief/Interim Summary: patient is a 58 year old Caucasian female with past medical history of nephrolithiasis presenting to the emergency room with fevers chills low back pain radiating to left lower quadrant and elevated white count admitted today for management of her sepsis and her left ureteral stone with obstruction   Sepsis:  Pt meets sepsis criteria with fever/ tachycardia/ source and white count; Patient received Levaquin 500 mg IV as 1 in the emergency room. WAS SEEN BY UROLOGY AND TAKE TO OR FOR STENT PLACEMENT. Tolerated procedure without complication, rx for abx and pain med by Dr Sheppard Pentonwolf and cleared for d/c home   Ureterolithiasis with obstruction: As above  Essential hypertension: C/w patient's losartan 100 mg daily on d/c   Hyperglycemia/obesity: Consistent with stress induced hyperglycemia, A1C5.5.5. No indication for DM meds  Dyslipidemia: Patient to continue her home med regimen  Discharge Diagnoses:  Principal Problem:   Sepsis (HCC) Active Problems:   Ureterolithiasis   Essential hypertension   Hyperglycemia   Obesity    Discharge Instructions  Discharge Instructions    Activity as tolerated - No restrictions   Complete by: As directed    Call MD for:  difficulty breathing, headache or visual disturbances   Complete by: As directed    Call MD for:  extreme fatigue   Complete by: As directed    Call MD for:  hives   Complete by: As directed    Call MD for:  persistant dizziness or  light-headedness   Complete by: As directed    Call MD for:  persistant nausea and vomiting   Complete by: As directed    Call MD for:  redness, tenderness, or signs of infection (pain, swelling, redness, odor or green/yellow discharge around incision site)   Complete by: As directed    Call MD for:  severe uncontrolled pain   Complete by: As directed    Call MD for:  temperature >100.4   Complete by: As directed    Diet - low sodium heart healthy   Complete by: As directed    Diet general   Complete by: As directed    Increase activity slowly   Complete by: As directed    Increase activity slowly   Complete by: As directed    May shower / Bathe   Complete by: As directed    stent string   Complete by: As directed    Instruct patient to not pull on string coming out of urethra     Allergies as of 11/01/2019      Reactions   Aciphex [rabeprazole] Swelling   Bactrim [sulfamethoxazole-trimethoprim] Diarrhea, Nausea Only   Ceftin [cefuroxime Axetil] Hives   Protonix [pantoprazole] Hives, Swelling   Vilazodone Hcl Hives, Other (See Comments)   Tongue swelling      Medication List    TAKE these medications   atorvastatin 40 MG tablet Commonly known as: LIPITOR Take 40 mg by mouth at bedtime.   buPROPion 200 MG 12 hr tablet Commonly known as: WELLBUTRIN SR Take 200 mg by mouth 2 (two) times daily.   cyanocobalamin 1000 MCG tablet Take 1,000  mcg by mouth daily.   famotidine 20 MG tablet Commonly known as: PEPCID Take 20 mg by mouth 2 (two) times daily.   HYDROcodone-acetaminophen 10-325 MG tablet Commonly known as: NORCO Take 1 tablet by mouth every 4 (four) hours as needed for moderate pain. Maximum dose per 24 hours -8 pills.   loratadine 10 MG tablet Commonly known as: CLARITIN Take 10 mg by mouth daily.   losartan 100 MG tablet Commonly known as: COZAAR Take 100 mg by mouth daily.   nitrofurantoin 100 MG capsule Commonly known as: Macrodantin Take 1  capsule (100 mg total) by mouth 2 (two) times daily.   Nucynta 50 MG tablet Generic drug: tapentadol Take 1 tablet (50 mg total) by mouth every 6 (six) hours as needed for severe pain. 1 TO 2 TABS Q 6 HOURS PRN PAIN   omeprazole 20 MG capsule Commonly known as: PRILOSEC Take 20 mg by mouth daily.   ondansetron 8 MG disintegrating tablet Commonly known as: Zofran ODT Take 1 tablet (8 mg total) by mouth every 6 (six) hours as needed for nausea or vomiting.   Pamprin Multi-Symptom 500-25-15 MG Tabs Generic drug: APAP-Pamabrom-Pyrilamine Take 2 tablets by mouth at bedtime.   promethazine 25 MG tablet Commonly known as: PHENERGAN Take 25 mg by mouth as directed.   pyridOXINE 50 MG tablet Commonly known as: VITAMIN B-6 Take 50 mg by mouth 2 (two) times daily.   rOPINIRole 0.25 MG tablet Commonly known as: REQUIP Take 1 tablet (0.25 mg total) by mouth at bedtime.   tamsulosin 0.4 MG Caps capsule Commonly known as: FLOMAX Take 1 capsule (0.4 mg total) by mouth daily.   Vitamin D3 125 MCG (5000 UT) Caps Take 5,000 Units by mouth daily.     ASK your doctor about these medications   ciprofloxacin 500 MG tablet Commonly known as: CIPRO Take 500 mg by mouth 2 (two) times daily. For 10 days Ask about: Should I take this medication?      Follow-up Information    Orson Ape, MD.   Specialty: Urology Why: post-op check-up Contact information: [address] [place] [address] [phone]          Allergies  Allergen Reactions  . Aciphex [Rabeprazole] Swelling  . Bactrim [Sulfamethoxazole-Trimethoprim] Diarrhea and Nausea Only  . Ceftin [Cefuroxime Axetil] Hives  . Protonix [Pantoprazole] Hives and Swelling  . Vilazodone Hcl Hives and Other (See Comments)    Tongue swelling    Consultations:  urology   Procedures/Studies: Dg Or Urology Cysto Image ([facility] Only)  Result Date: 10/31/2019 There is no interpretation for this exam.  This order is for  images obtained during a surgical procedure.  Please See "Surgeries" Tab for more information regarding the procedure.   Ct Renal Stone Study  Result Date: 10/30/2019 CLINICAL DATA:  Left flank pain. Status post lithotripsy. EXAM: CT ABDOMEN AND PELVIS WITHOUT CONTRAST TECHNIQUE: Multidetector CT imaging of the abdomen and pelvis was performed following the standard protocol without IV contrast. COMPARISON:  CT of the abdomen and pelvis 07/27/2019 FINDINGS: Lower chest: The lung bases are clear without focal nodule or mass. The heart size is normal. Hepatobiliary: There is diffuse fatty infiltration of the liver. No discrete lesions are present. Cholecystectomy is noted. Common bile duct is within normal limits. Pancreas: Unremarkable. No pancreatic ductal dilatation or surrounding inflammatory changes. Spleen: Normal in size without focal abnormality. Adrenals/Urinary Tract: The adrenal glands are normal bilaterally. A punctate nonobstructing stone is present at the lower  pole of the right kidney. The right kidney and ureter are otherwise within normal limits. 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 There are nonobstructing stone fragments within dependent calices. Moderate left-sided hydronephrosis is present. The left ureter is dilated to the level of obstructing stone fragments in the lower ureter stone fragments measure over 1.5 cm in the distal ureter. Additional layering stones are present dependently within the urinary bladder. Stomach/Bowel: The stomach and duodenum are within normal limits. Small bowel is unremarkable. The appendix is visualized and within normal limits. The ascending and transverse colon are normal. Vascular/Lymphatic: No significant vascular findings are present. No enlarged abdominal or pelvic lymph nodes. Reproductive: Status post hysterectomy. No adnexal masses. Other: No abdominal wall hernia or abnormality. No  abdominopelvic ascites. Musculoskeletal: Lumbar spine fusion is solid at L4-5 and L5-S1. Adjacent level disease is present at L3-4. No acute osseous abnormalities are present. No focal lytic or blastic lesions are present. The hips are located and within normal limits. IMPRESSION: 1. Obstructing stone fragments (steinstrasse) in the distal left ureter measuring up to 1.5 cm. 2. Moderate left-sided hydroureteronephrosis. 3. Additional nonobstructing stone fragments are present within dependent calices of the left kidney. 4. Layering stone fragments are present dependently within the urinary bladder. 5. Punctate nonobstructing stone at the lower pole of the right kidney. 6. Hepatic steatosis. 7. Cholecystectomy and hysterectomy. 8. Lumbar spine fusion at L4-5 and L5-S1. Electronically Signed   By: Marin Roberts M.D.   On: 10/30/2019 06:20       Subjective: No acute events overnight Tolerated procedure well Stabel and requesting d/c home  Discharge Exam: Vitals:   11/01/19 0615 11/01/19 1243  BP: 133/67 122/75  Pulse: 68 69  Resp:  16  Temp: 97.7 F (36.5 C) 98.6 F (37 C)  SpO2: 97% 100%   Vitals:   10/31/19 1857 10/31/19 1931 11/01/19 0615 11/01/19 1243  BP: (!) 158/97 (!) 142/68 133/67 122/75  Pulse: 91 84 68 69  Resp: Temp: 98.7 F (37.1 C) 98.7 F (37.1 C) 97.7 F (36.5 C) 98.6 F (37 C)  TempSrc: Oral Oral Oral Oral  SpO2: 100% 99% 97% 100%  Weight:      Height:        General: Pt is alert, awake, not in acute distress Cardiovascular: RRR, S1/S2 +, no rubs, no gallops Respiratory: CTA bilaterally, no wheezing, no rhonchi Abdominal: Soft, NT, ND, bowel sounds + Extremities: no edema, no cyanosis    The results of significant diagnostics from this hospitalization (including imaging, microbiology, ancillary and laboratory) are listed below for reference.     Microbiology: Recent Results (from the past 240 hour(s))  SARS Coronavirus 2 by RT PCR  (hospital order, performed in [facility] hospital lab) Nasopharyngeal Nasopharyngeal Swab     Status: None   Collection Time: 10/31/19  1:49 PM   Specimen: Nasopharyngeal Swab  Result Value Ref Range Status   SARS Coronavirus 2 NEGATIVE NEGATIVE Final    Comment: (NOTE) If result is NEGATIVE SARS-CoV-2 target nucleic acids are NOT DETECTED. The SARS-CoV-2 RNA is generally detectable in upper and lower  respiratory specimens during the acute phase of infection. The lowest  concentration of SARS-CoV-2 viral copies this assay can detect is 250  copies / mL. A negative result does not preclude SARS-CoV-2 infection  and should not be used as the sole basis for treatment or other  patient management decisions.  A negative result may occur with  improper specimen collection / handling, submission of specimen other  than nasopharyngeal swab, presence of viral mutation(s) within the  areas targeted by this assay, and inadequate number of viral copies  (<250 copies / mL). A negative result must be combined with clinical  observations, patient history, and epidemiological information. If result is POSITIVE SARS-CoV-2 target nucleic acids are DETECTED. The SARS-CoV-2 RNA is generally detectable in upper and lower  respiratory specimens dur ing the acute phase of infection.  Positive  results are indicative of active infection with SARS-CoV-2.  Clinical  correlation with patient history and other diagnostic information is  necessary to determine patient infection status.  Positive results do  not rule out bacterial infection or co-infection with other viruses. If result is PRESUMPTIVE POSTIVE SARS-CoV-2 nucleic acids MAY BE PRESENT.   A presumptive positive result was obtained on the submitted specimen  and confirmed on repeat testing.  While 2019 novel coronavirus  (SARS-CoV-2) nucleic acids may be present in the submitted sample  additional confirmatory testing may be necessary for  epidemiological  and / or clinical management purposes  to differentiate between  SARS-CoV-2 and other Sarbecovirus currently known to infect humans.  If clinically indicated additional testing with an alternate test  methodology [phone]) is advised. The SARS-CoV-2 RNA is generally  detectable in upper and lower respiratory sp ecimens during the acute  phase of infection. The expected result is Negative. Fact Sheet for Patients:  StrictlyIdeas.no Fact Sheet for Healthcare Providers: BankingDealers.co.za This test is not yet approved or cleared by the [place] FDA and has been authorized for detection and/or diagnosis of SARS-CoV-2 by FDA under an Emergency Use Authorization (EUA).  This EUA will remain in effect (meaning this test can be used) for the duration of the COVID-19 declaration under Section 564(b)(1) of the Act, 21 U.S.C. section 360bbb-3(b)(1), unless the authorization is terminated or revoked sooner. Performed at [facility], [address] [place], [place] [address]      Labs: BNP (last 3 results) No results for input(s): BNP in the last 8760 hours. Basic Metabolic Panel: Recent Labs  Lab 10/30/19 0528 10/31/19 1032 10/31/19 2053  NA 135 136  --   K 4.0 3.6  --   CL 101 103  --   CO2 23 24  --   GLUCOSE 148* 111*  --   BUN 17 16  --   CREATININE 0.97 1.34*  --   CALCIUM 8.9 8.7*  --   MG  --   --  2.3  PHOS  --   --  2.2*   Liver Function Tests: Recent Labs  Lab 10/31/19 1032  AST 23  ALT 27  ALKPHOS 73  BILITOT 1.6*  PROT 7.3  ALBUMIN 4.1   Recent Labs  Lab 10/31/19 1032  LIPASE 18   No results for input(s): AMMONIA in the last 168 hours. CBC: Recent Labs  Lab 10/30/19 0528 10/31/19 1032 10/31/19 2053  WBC 16.3* 16.2* 8.0  NEUTROABS  --   --  7.0  HGB 13.4 12.8 12.4  HCT 40.4 37.7 36.9  MCV 88.6 85.5 88.5  PLT 308 289 272   Cardiac Enzymes: No results for input(s):  CKTOTAL, CKMB, CKMBINDEX, TROPONINI in the last 168 hours. BNP: Invalid input(s): POCBNP CBG: No results for input(s): GLUCAP in the last 168 hours. D-Dimer No results for input(s): DDIMER in the last 72 hours. Hgb A1c Recent Labs    10/31/19 2053  HGBA1C 5.5   Lipid Profile No results for input(s): CHOL, HDL, LDLCALC, TRIG, CHOLHDL, LDLDIRECT in the last 72 hours. Thyroid function studies Recent Labs    10/31/19 2053  TSH 1.190   Anemia work up No results for input(s): VITAMINB12, FOLATE, FERRITIN, TIBC, IRON, RETICCTPCT in the last 72 hours. Urinalysis    Component Value Date/Time   COLORURINE YELLOW (A) 10/31/2019 1032   APPEARANCEUR HAZY (A) 10/31/2019 1032   APPEARANCEUR Hazy 04/23/2014 1234   LABSPEC 1.017 10/31/2019 1032   LABSPEC 1.006 04/23/2014 1234   PHURINE 5.0 10/31/2019 1032   GLUCOSEU NEGATIVE 10/31/2019 1032   GLUCOSEU Negative 04/23/2014 1234   HGBUR LARGE (A) 10/31/2019 1032   BILIRUBINUR NEGATIVE 10/31/2019 1032   BILIRUBINUR Negative 04/23/2014 1234   KETONESUR 5 (A) 10/31/2019 1032   PROTEINUR 30 (A) 10/31/2019 1032   UROBILINOGEN 1.0 06/26/2013 2049   NITRITE NEGATIVE 10/31/2019 1032   LEUKOCYTESUR MODERATE (A) 10/31/2019 1032   LEUKOCYTESUR 1+ 04/23/2014 1234   Sepsis Labs Invalid input(s): PROCALCITONIN,  WBC,  LACTICIDVEN Microbiology Recent Results (from the past 240 hour(s))  SARS Coronavirus 2 by RT PCR (hospital order, performed in [facility] hospital lab) Nasopharyngeal Nasopharyngeal Swab     Status: None   Collection Time: 10/31/19  1:49 PM   Specimen: Nasopharyngeal Swab  Result Value Ref Range Status   SARS Coronavirus 2 NEGATIVE NEGATIVE Final    Comment: (NOTE) If result is NEGATIVE SARS-CoV-2 target nucleic acids are NOT DETECTED. The SARS-CoV-2 RNA is generally detectable in upper and lower  respiratory specimens during the acute phase of infection. The lowest  concentration of SARS-CoV-2 viral copies this assay can  detect is 250  copies / mL. A negative result does not preclude SARS-CoV-2 infection  and should not be used as the sole basis for treatment or other  patient management decisions.  A negative result may occur with  improper specimen collection / handling, submission of specimen other  than nasopharyngeal swab, presence of viral mutation(s) within the  areas targeted by this assay, and inadequate number of viral copies  (<250 copies / mL). A negative result must be combined with clinical  observations, patient history, and epidemiological information. If result is POSITIVE SARS-CoV-2 target nucleic acids are DETECTED. The SARS-CoV-2 RNA is generally detectable in upper and lower  respiratory specimens dur ing the acute phase of infection.  Positive  results are indicative of active infection with SARS-CoV-2.  Clinical  correlation with patient history and other diagnostic information is  necessary to determine patient infection status.  Positive results do  not rule out bacterial infection or co-infection with other viruses. If result is PRESUMPTIVE POSTIVE SARS-CoV-2 nucleic acids MAY BE PRESENT.   A presumptive positive result was obtained on the submitted specimen  and confirmed on repeat testing.  While 2019 novel coronavirus  (SARS-CoV-2) nucleic acids may be present in the submitted sample  additional confirmatory testing may be necessary for epidemiological  and / or clinical management purposes  to differentiate between  SARS-CoV-2 and other Sarbecovirus currently known to infect humans.  If clinically indicated additional testing with an alternate test  methodology [phone]) is advised. The SARS-CoV-2 RNA is generally  detectable in upper and lower respiratory sp ecimens during the acute  phase of infection. The expected result is Negative. Fact Sheet for Patients:  BoilerBrush.com.cy Fact Sheet for Healthcare  Providers: https://pope.com/ This test is not yet approved or cleared by the [place] FDA and has been authorized for  detection and/or diagnosis of SARS-CoV-2 by FDA under an Emergency Use Authorization (EUA).  This EUA will remain in effect (meaning this test can be used) for the duration of the COVID-19 declaration under Section 564(b)(1) of the Act, 21 U.S.C. section 360bbb-3(b)(1), unless the authorization is terminated or revoked sooner. Performed at [facility], [address] [place], [place] [address]      Time coordinating discharge: Over 30 minutes  SIGNED:   Burke Keels, MD  [facility] 11/01/2019, 1:52 PM Pager   If 7PM-7AM, please contact night-coverage [email or web address] Password [email or web address]

## 2019-11-02 ENCOUNTER — Encounter: Payer: Self-pay | Admitting: Urology

## 2019-11-02 LAB — URINE CULTURE: Culture: <10000 — AB

## 2019-11-05 LAB — CULTURE, BLOOD (ROUTINE X 2)
Culture: NO GROWTH
Culture: NO GROWTH

## 2019-12-01 ENCOUNTER — Ambulatory Visit: Payer: Self-pay | Admitting: Urology

## 2020-03-28 ENCOUNTER — Other Ambulatory Visit: Payer: Self-pay | Admitting: Family Medicine

## 2020-03-28 DIAGNOSIS — Z1231 Encounter for screening mammogram for malignant neoplasm of breast: Secondary | ICD-10-CM

## 2020-10-12 ENCOUNTER — Other Ambulatory Visit: Payer: Self-pay

## 2020-10-12 ENCOUNTER — Inpatient Hospital Stay
Admission: EM | Admit: 2020-10-12 | Discharge: 2020-10-17 | DRG: 177 | Disposition: A | Discharge status: Home / Self Care | Payer: BC Managed Care – PPO | Attending: Internal Medicine | Admitting: Internal Medicine

## 2020-10-12 ENCOUNTER — Emergency Department: Payer: BC Managed Care – PPO

## 2020-10-12 DIAGNOSIS — A0839 Other viral enteritis: Secondary | ICD-10-CM | POA: Diagnosis present

## 2020-10-12 DIAGNOSIS — G8929 Other chronic pain: Secondary | ICD-10-CM | POA: Diagnosis present

## 2020-10-12 DIAGNOSIS — E785 Hyperlipidemia, unspecified: Secondary | ICD-10-CM | POA: Diagnosis present

## 2020-10-12 DIAGNOSIS — J9601 Acute respiratory failure with hypoxia: Secondary | ICD-10-CM | POA: Diagnosis not present

## 2020-10-12 DIAGNOSIS — E0781 Sick-euthyroid syndrome: Secondary | ICD-10-CM | POA: Diagnosis present

## 2020-10-12 DIAGNOSIS — I10 Essential (primary) hypertension: Secondary | ICD-10-CM | POA: Diagnosis present

## 2020-10-12 DIAGNOSIS — K219 Gastro-esophageal reflux disease without esophagitis: Secondary | ICD-10-CM | POA: Diagnosis present

## 2020-10-12 DIAGNOSIS — J1282 Pneumonia due to coronavirus disease 2019: Secondary | ICD-10-CM | POA: Diagnosis present

## 2020-10-12 DIAGNOSIS — U071 COVID-19: Principal | ICD-10-CM

## 2020-10-12 DIAGNOSIS — F419 Anxiety disorder, unspecified: Secondary | ICD-10-CM

## 2020-10-12 DIAGNOSIS — Z90710 Acquired absence of both cervix and uterus: Secondary | ICD-10-CM

## 2020-10-12 DIAGNOSIS — Z79899 Other long term (current) drug therapy: Secondary | ICD-10-CM

## 2020-10-12 DIAGNOSIS — F411 Generalized anxiety disorder: Secondary | ICD-10-CM

## 2020-10-12 DIAGNOSIS — E039 Hypothyroidism, unspecified: Secondary | ICD-10-CM | POA: Diagnosis present

## 2020-10-12 DIAGNOSIS — Z6841 Body Mass Index (BMI) 40.0 and over, adult: Secondary | ICD-10-CM

## 2020-10-12 DIAGNOSIS — G2581 Restless legs syndrome: Secondary | ICD-10-CM | POA: Diagnosis present

## 2020-10-12 DIAGNOSIS — D75838 Other thrombocytosis: Secondary | ICD-10-CM | POA: Diagnosis present

## 2020-10-12 DIAGNOSIS — J9600 Acute respiratory failure, unspecified whether with hypoxia or hypercapnia: Secondary | ICD-10-CM

## 2020-10-12 DIAGNOSIS — Z23 Encounter for immunization: Secondary | ICD-10-CM

## 2020-10-12 DIAGNOSIS — F32A Depression, unspecified: Secondary | ICD-10-CM

## 2020-10-12 DIAGNOSIS — E66813 Obesity, class 3: Secondary | ICD-10-CM

## 2020-10-12 LAB — TROPONIN I (HIGH SENSITIVITY)
Troponin I (High Sensitivity): 15 ng/L (ref <18)
Troponin I (High Sensitivity): 13 ng/L (ref <18)

## 2020-10-12 LAB — COMPREHENSIVE METABOLIC PANEL
ALT: 44 U/L (ref 0–44)
AST: 42 U/L — ABNORMAL HIGH (ref 15–41)
Albumin: 3.4 g/dL — ABNORMAL LOW (ref 3.5–5.0)
Alkaline Phosphatase: 60 U/L (ref 38–126)
Anion gap: 12 (ref 5–15)
BUN: 19 mg/dL (ref 6–20)
CO2: 25 mmol/L (ref 22–32)
Calcium: 8.7 mg/dL — ABNORMAL LOW (ref 8.9–10.3)
Chloride: 105 mmol/L (ref 98–111)
Creatinine, Ser: 0.84 mg/dL (ref 0.44–1.00)
GFR, Estimated: >60 mL/min (ref >60)
Glucose, Bld: 115 mg/dL — ABNORMAL HIGH (ref 70–99)
Potassium: 3.8 mmol/L (ref 3.5–5.1)
Sodium: 142 mmol/L (ref 135–145)
Total Bilirubin: 0.7 mg/dL (ref 0.3–1.2)
Total Protein: 7.2 g/dL (ref 6.5–8.1)

## 2020-10-12 LAB — CBC
HCT: 43.2 % (ref 36.0–46.0)
Hemoglobin: 14.5 g/dL (ref 12.0–15.0)
MCH: 30.1 pg (ref 26.0–34.0)
MCHC: 33.6 g/dL (ref 30.0–36.0)
MCV: 89.8 fL (ref 80.0–100.0)
Platelets: 441 10*3/uL — ABNORMAL HIGH (ref 150–400)
RBC: 4.81 MIL/uL (ref 3.87–5.11)
RDW: 13.7 % (ref 11.5–15.5)
WBC: 6.7 10*3/uL (ref 4.0–10.5)
nRBC: 0 % (ref 0.0–0.2)

## 2020-10-12 LAB — RESPIRATORY PANEL BY RT PCR (FLU A&B, COVID)
Influenza A by PCR: NEGATIVE
Influenza B by PCR: NEGATIVE
SARS Coronavirus 2 by RT PCR: POSITIVE — AB

## 2020-10-12 LAB — LACTIC ACID, PLASMA: Lactic Acid, Venous: 1.7 mmol/L (ref 0.5–1.9)

## 2020-10-12 MED ORDER — LACTATED RINGERS IV BOLUS
1000.0000 mL | Freq: Once | INTRAVENOUS | Status: AC
  Administered 2020-10-13: 1000 mL via INTRAVENOUS

## 2020-10-12 MED ORDER — METHYLPREDNISOLONE SODIUM SUCC 125 MG IJ SOLR
125.0000 mg | Freq: Once | INTRAMUSCULAR | Status: AC
  Administered 2020-10-13: 125 mg via INTRAVENOUS
  Filled 2020-10-12: qty 2

## 2020-10-12 NOTE — ED Triage Notes (Signed)
Pt in with co diarrhea for a week, shortness of breath worse on exertion. Has had fever of 103 at home, pt has not tested for Covid.

## 2020-10-12 NOTE — ED Provider Notes (Signed)
[facility] Emergency Department Provider Note  ____________________________________________  Time seen: Approximately 11:59 PM  I have reviewed the triage vital signs and the nursing notes.   HISTORY  Chief Complaint Shortness of Breath and Diarrhea   HPI Brittany Cain is a 59 y.o. female with a history of hypertension and obesity who presents for evaluation of shortness of breath.  Patient has had a week of fever, body aches, diarrhea and now progressively worsening shortness of breath.  She is unvaccinated against Covid.  No known sick contact exposure.  She reports that her shortness of breath is now constant and moderate in intensity.  No chest pain.  No personal or family history of blood clots, recent travel immobilization, leg pain or swelling, hemoptysis, or exogenous hormones.  No nausea or vomiting, no abdominal pain.   Past Medical History:  Diagnosis Date  . Anxiety   . Chicken pox   . Chronic back pain   . Depression   . GERD (gastroesophageal reflux disease)   . History of kidney stones   . Hypertension   . Obesity   . Restless leg syndrome     Patient Active Problem List   Diagnosis Date Noted  . Sepsis (HCC) 10/31/2019  . Ureterolithiasis 10/31/2019  . Essential hypertension 10/31/2019  . Hyperglycemia 10/31/2019  . Restless leg syndrome   . Obesity   . Hypertension   . Major depressive disorder, recurrent episode, severe (HCC) 04/09/2014  . MDD (major depressive disorder), recurrent episode, severe (HCC) 04/08/2014  . GAD (generalized anxiety disorder) 03/25/2014    Past Surgical History:  Procedure Laterality Date  . ABDOMINAL HYSTERECTOMY    . BACK SURGERY     lower back; metal in lower back  . CHOLECYSTECTOMY    . CYSTOSCOPY/URETEROSCOPY/HOLMIUM LASER/STENT PLACEMENT Left 10/31/2019   Procedure: ,left stent placement,left retrograde pyelogram,cystoscopy;  Surgeon: Orson Ape, MD;  Location: [facility] ORS;  Service:  Urology;  Laterality: Left;  . ESOPHAGOGASTRODUODENOSCOPY (EGD) WITH PROPOFOL N/A 05/24/2017   Procedure: ESOPHAGOGASTRODUODENOSCOPY (EGD) WITH PROPOFOL;  Surgeon: Christena Deem, MD;  Location: [facility] ENDOSCOPY;  Service: Endoscopy;  Laterality: N/A;  . EXTRACORPOREAL SHOCK WAVE LITHOTRIPSY Left 10/29/2019   Procedure: EXTRACORPOREAL SHOCK WAVE LITHOTRIPSY (ESWL);  Surgeon: Orson Ape, MD;  Location: [facility] ORS;  Service: Urology;  Laterality: Left;  . INCISIONAL HERNIA REPAIR N/A 06/01/2019   Procedure: LAPAROSCOPIC INCISIONAL HERNIA REPAIR WITH MESH;  Surgeon: Carolan Shiver, MD;  Location: [facility] ORS;  Service: General;  Laterality: N/A;    Prior to Admission medications   Medication Sig Start Date End Date Taking? Authorizing Provider  APAP-Pamabrom-Pyrilamine (PAMPRIN MULTI-SYMPTOM) 500-25-15 MG TABS Take 2 tablets by mouth at bedtime.    [provider]  atorvastatin (LIPITOR) 40 MG tablet Take 40 mg by mouth at bedtime.     [provider]  buPROPion (WELLBUTRIN SR) 200 MG 12 hr tablet Take 200 mg by mouth 2 (two) times daily.    [provider]  Cholecalciferol (VITAMIN D3) 125 MCG (5000 UT) CAPS Take 5,000 Units by mouth daily.    [provider]  cyanocobalamin 1000 MCG tablet Take 1,000 mcg by mouth daily.    [provider]  famotidine (PEPCID) 20 MG tablet Take 20 mg by mouth 2 (two) times daily.    [provider]  HYDROcodone-acetaminophen (NORCO) 10-325 MG tablet Take 1 tablet by mouth every 4 (four) hours as needed for moderate pain. Maximum dose per 24 hours -8 pills. 10/29/19  Orson Ape, MD  loratadine (CLARITIN) 10 MG tablet Take 10 mg by mouth daily.    [provider]  losartan (COZAAR) 100 MG tablet Take 100 mg by mouth daily.    [provider]  nitrofurantoin (MACRODANTIN) 100 MG capsule Take 1 capsule (100 mg total) by mouth 2 (two) times daily. 10/31/19   Orson Ape, MD    NUCYNTA 50 MG tablet Take 1 tablet (50 mg total) by mouth every 6 (six) hours as needed for severe pain. 1 TO 2 TABS Q 6 HOURS PRN PAIN 10/31/19   Orson Ape, MD  omeprazole (PRILOSEC) 20 MG capsule Take 20 mg by mouth daily.    [provider]  ondansetron (ZOFRAN ODT) 8 MG disintegrating tablet Take 1 tablet (8 mg total) by mouth every 6 (six) hours as needed for nausea or vomiting. 10/29/19   Orson Ape, MD  promethazine (PHENERGAN) 25 MG tablet Take 25 mg by mouth as directed. 10/21/19   [provider]  pyridOXINE (VITAMIN B-6) 50 MG tablet Take 50 mg by mouth 2 (two) times daily.    [provider]  rOPINIRole (REQUIP) 0.25 MG tablet Take 1 tablet (0.25 mg total) by mouth at bedtime. 03/31/14   Tamala Julian, PA-C  tamsulosin (FLOMAX) 0.4 MG CAPS capsule Take 1 capsule (0.4 mg total) by mouth daily. 10/29/19   Orson Ape, MD    Allergies Aciphex [rabeprazole], Bactrim [sulfamethoxazole-trimethoprim], Ceftin [cefuroxime axetil], Protonix [pantoprazole], and Vilazodone hcl  Family History  Problem Relation Age of Onset  . Stroke Mother   . Prostate cancer Father   . Breast cancer Neg Hx     Social History Social History   Tobacco Use  . Smoking status: Never Smoker  . Smokeless tobacco: Never Used  Vaping Use  . Vaping Use: Never used  Substance Use Topics  . Alcohol use: No  . Drug use: No    Review of Systems  Constitutional: + fever. Eyes: Negative for visual changes. ENT: Negative for sore throat. Neck: No neck pain  Cardiovascular: Negative for chest pain. Respiratory: + shortness of breath. Gastrointestinal: Negative for abdominal pain, vomiting. + diarrhea. Genitourinary: Negative for dysuria. Musculoskeletal: Negative for back pain. Skin: Negative for rash. Neurological: Negative for headaches, weakness or numbness. Psych: No SI or HI  ____________________________________________   PHYSICAL EXAM:  VITAL  SIGNS: ED Triage Vitals  Enc Vitals Group     BP 10/12/20 1952 118/71     Pulse Rate 10/12/20 1949 74     Resp 10/12/20 1949 (!) 22     Temp 10/12/20 1949 98.4 F (36.9 C)     Temp Source 10/12/20 1949 Oral     SpO2 10/12/20 1952 (!) 88 %     Weight 10/12/20 1950 233 lb (105.7 kg)     Height 10/12/20 1950 5\' 2"  (1.575 m)     Head Circumference --      Peak Flow --      Pain Score 10/12/20 1950 2     Pain Loc --      Pain Edu? --      Excl. in GC? --     Constitutional: Alert and oriented, looks unwell and mild respiratory distress. HEENT:      Head: Normocephalic and atraumatic.         Eyes: Conjunctivae are normal. Sclera is non-icteric.       Mouth/Throat: Mucous membranes are moist.  Neck: Supple with no signs of meningismus. Cardiovascular: Regular rate and rhythm. No murmurs, gallops, or rubs. 2+ symmetrical distal pulses are present in all extremities. No JVD. Respiratory: Tachypneic and hypoxic on room air which improved on 2 L nasal cannula, increased work of breathing Gastrointestinal: Soft, non tender, and non distended. Musculoskeletal:  No edema, cyanosis, or erythema of extremities. Neurologic: Normal speech and language. Face is symmetric. Moving all extremities. No gross focal neurologic deficits are appreciated. Skin: Skin is warm, dry and intact. No rash noted. Psychiatric: Mood and affect are normal. Speech and behavior are normal.  ____________________________________________   LABS (all labs ordered are listed, but only abnormal results are displayed)  Labs Reviewed  RESPIRATORY PANEL BY RT PCR (FLU A&B, COVID) - Abnormal; Notable for the following components:      Result Value   SARS Coronavirus 2 by RT PCR POSITIVE (*)    All other components within normal limits  CBC - Abnormal; Notable for the following components:   Platelets 441 (*)    All other components within normal limits  COMPREHENSIVE METABOLIC PANEL - Abnormal; Notable for the  following components:   Glucose, Bld 115 (*)    Calcium 8.7 (*)    Albumin 3.4 (*)    AST 42 (*)    All other components within normal limits  LACTIC ACID, PLASMA  LACTIC ACID, PLASMA  FIBRIN DERIVATIVES D-DIMER ([facility] ONLY)  PROCALCITONIN  TROPONIN I (HIGH SENSITIVITY)  TROPONIN I (HIGH SENSITIVITY)   ____________________________________________  EKG  ED ECG REPORT I, Nita Sicklearolina Odyn Turko, the attending physician, personally viewed and interpreted this ECG.  Normal sinus rhythm, rate of 81, normal intervals, normal axis, diffuse T wave flattening, no ST elevations or depressions.  No significant changes when compared to prior from June 2020 ____________________________________________  RADIOLOGY  I have personally reviewed the images performed during this visit and I agree with the Radiologist's read.   Interpretation by Radiologist:  DG Chest 2 View  Result Date: 10/12/2020 CLINICAL DATA:  Shortness of breath EXAM: CHEST - 2 VIEW COMPARISON:  None. FINDINGS: The heart size and mediastinal contours are within normal limits. Patchy airspace opacities are seen at the periphery of both lower lungs. The visualized skeletal structures are unremarkable. IMPRESSION: Patchy airspace opacities at the periphery of both lower lungs, likely consistent with multifocal pneumonia. Electronically Signed   By: Jonna ClarkBindu  Avutu M.D.   On: 10/12/2020 20:22     ____________________________________________   PROCEDURES  Procedure(s) performed:yes .1-3 Lead EKG Interpretation Performed by: Nita SickleVeronese, [place], MD Authorized by: Nita SickleVeronese, [place], MD     Interpretation: non-specific     ECG rate assessment: normal     Rhythm: sinus rhythm     Ectopy: none     Critical Care performed: yes  CRITICAL CARE Performed by: Nita Sicklearolina Anyssa Sharpless  ?  Total critical care time: 35 min  Critical care time was exclusive of separately billable procedures and treating other patients.  Critical care was  necessary to treat or prevent imminent or life-threatening deterioration.  Critical care was time spent personally by me on the following activities: development of treatment plan with patient and/or surrogate as well as nursing, discussions with consultants, evaluation of patient's response to treatment, examination of patient, obtaining history from patient or surrogate, ordering and performing treatments and interventions, ordering and review of laboratory studies, ordering and review of radiographic studies, pulse oximetry and re-evaluation of patient's condition.  ____________________________________________   INITIAL IMPRESSION / ASSESSMENT AND PLAN / ED  COURSE  59 y.o. female with a history of hypertension and obesity who presents for evaluation of shortness of breath, fever, diarrhea. Patient tested positive for Covid here. Not vaccinated.  Mild respiratory distress with tachypnea and increased work of breathing, hypoxic to 88% on room air.  Patient was placed on 2 L nasal cannula with improvement of her hypoxia.  EKG with no signs of acute ischemic changes.  Old medical records reviewed including most recent PCP visit 2 weeks ago where patient was diagnosed with borderline diabetes.  She was placed on telemetry for close monitoring.  Chest x-ray visualized by me showing multifocal pneumonia, confirmed by radiology.  Flu negative.  No significant abnormalities on blood work with a 2 - high-sensitivity troponins.  Due to hypoxia and positive Covid will check a D-dimer and if that is elevated will do a CT of the chest to rule out a PE.  Will start patient on remdesivir and steroids.  Will admit patient to the hospitalist service.  _________________________ 1:43 AM on 10/13/2020 -----------------------------------------  D-dimer negative.  Procalcitonin is negative.  Lactic borderline for which patient received a liter bolus.  Patient is accepted to the hospitalist service.     _____________________________________________ Please note:  Patient was evaluated in Emergency Department today for the symptoms described in the history of present illness. Patient was evaluated in the context of the global COVID-19 pandemic, which necessitated consideration that the patient might be at risk for infection with the SARS-CoV-2 virus that causes COVID-19. Institutional protocols and algorithms that pertain to the evaluation of patients at risk for COVID-19 are in a state of rapid change based on information released by regulatory bodies including the CDC and federal and state organizations. These policies and algorithms were followed during the patient's care in the ED.  Some ED evaluations and interventions may be delayed as a result of limited staffing during the pandemic.   [place] Controlled Substance Database was reviewed by me. ____________________________________________   FINAL CLINICAL IMPRESSION(S) / ED DIAGNOSES   Final diagnoses:  Acute respiratory failure with hypoxia (HCC)  Pneumonia due to COVID-19 virus      NEW MEDICATIONS STARTED DURING THIS VISIT:  ED Discharge Orders    None       Note:  This document was prepared using Dragon voice recognition software and may include unintentional dictation errors.    Don Perking, [place], MD 10/13/20 [phone]

## 2020-10-12 NOTE — ED Notes (Signed)
Pt placed on O2 at 2l per [place]

## 2020-10-13 ENCOUNTER — Encounter: Payer: Self-pay | Admitting: Internal Medicine

## 2020-10-13 DIAGNOSIS — J9601 Acute respiratory failure with hypoxia: Secondary | ICD-10-CM | POA: Diagnosis present

## 2020-10-13 DIAGNOSIS — J1282 Pneumonia due to coronavirus disease 2019: Secondary | ICD-10-CM

## 2020-10-13 DIAGNOSIS — E039 Hypothyroidism, unspecified: Secondary | ICD-10-CM | POA: Diagnosis present

## 2020-10-13 DIAGNOSIS — Z23 Encounter for immunization: Secondary | ICD-10-CM | POA: Diagnosis not present

## 2020-10-13 DIAGNOSIS — U071 COVID-19: Principal | ICD-10-CM

## 2020-10-13 DIAGNOSIS — F411 Generalized anxiety disorder: Secondary | ICD-10-CM | POA: Diagnosis present

## 2020-10-13 DIAGNOSIS — G8929 Other chronic pain: Secondary | ICD-10-CM | POA: Diagnosis present

## 2020-10-13 DIAGNOSIS — R7401 Elevation of levels of liver transaminase levels: Secondary | ICD-10-CM | POA: Diagnosis not present

## 2020-10-13 DIAGNOSIS — Z6841 Body Mass Index (BMI) 40.0 and over, adult: Secondary | ICD-10-CM | POA: Diagnosis not present

## 2020-10-13 DIAGNOSIS — Z79899 Other long term (current) drug therapy: Secondary | ICD-10-CM | POA: Diagnosis not present

## 2020-10-13 DIAGNOSIS — F419 Anxiety disorder, unspecified: Secondary | ICD-10-CM

## 2020-10-13 DIAGNOSIS — A0839 Other viral enteritis: Secondary | ICD-10-CM

## 2020-10-13 DIAGNOSIS — G2581 Restless legs syndrome: Secondary | ICD-10-CM | POA: Diagnosis present

## 2020-10-13 DIAGNOSIS — J9600 Acute respiratory failure, unspecified whether with hypoxia or hypercapnia: Secondary | ICD-10-CM

## 2020-10-13 DIAGNOSIS — E0781 Sick-euthyroid syndrome: Secondary | ICD-10-CM | POA: Diagnosis present

## 2020-10-13 DIAGNOSIS — D75838 Other thrombocytosis: Secondary | ICD-10-CM | POA: Diagnosis present

## 2020-10-13 DIAGNOSIS — Z90710 Acquired absence of both cervix and uterus: Secondary | ICD-10-CM | POA: Diagnosis not present

## 2020-10-13 DIAGNOSIS — I10 Essential (primary) hypertension: Secondary | ICD-10-CM | POA: Diagnosis not present

## 2020-10-13 DIAGNOSIS — K219 Gastro-esophageal reflux disease without esophagitis: Secondary | ICD-10-CM | POA: Diagnosis present

## 2020-10-13 DIAGNOSIS — E66813 Obesity, class 3: Secondary | ICD-10-CM

## 2020-10-13 DIAGNOSIS — E785 Hyperlipidemia, unspecified: Secondary | ICD-10-CM | POA: Diagnosis present

## 2020-10-13 DIAGNOSIS — F32A Depression, unspecified: Secondary | ICD-10-CM

## 2020-10-13 LAB — FIBRIN DERIVATIVES D-DIMER (ARMC ONLY): Fibrin derivatives D-dimer (ARMC): 474.19 ng/mL (FEU) (ref 0.00–499.00)

## 2020-10-13 LAB — CBC
HCT: 42.3 % (ref 36.0–46.0)
Hemoglobin: 14 g/dL (ref 12.0–15.0)
MCH: 29.9 pg (ref 26.0–34.0)
MCHC: 33.1 g/dL (ref 30.0–36.0)
MCV: 90.4 fL (ref 80.0–100.0)
Platelets: 444 10*3/uL — ABNORMAL HIGH (ref 150–400)
RBC: 4.68 MIL/uL (ref 3.87–5.11)
RDW: 13.7 % (ref 11.5–15.5)
WBC: 9.8 10*3/uL (ref 4.0–10.5)
nRBC: 0 % (ref 0.0–0.2)

## 2020-10-13 LAB — CREATININE, SERUM
Creatinine, Ser: 0.85 mg/dL (ref 0.44–1.00)
GFR, Estimated: >60 mL/min (ref >60)

## 2020-10-13 LAB — PROCALCITONIN: Procalcitonin: <0.1 ng/mL

## 2020-10-13 LAB — LACTIC ACID, PLASMA: Lactic Acid, Venous: 2 mmol/L (ref 0.5–1.9)

## 2020-10-13 MED ORDER — ALBUTEROL SULFATE HFA 108 (90 BASE) MCG/ACT IN AERS
2.0000 | INHALATION_SPRAY | Freq: Four times a day (QID) | RESPIRATORY_TRACT | Status: DC
  Administered 2020-10-13 – 2020-10-17 (×18): 2 via RESPIRATORY_TRACT
  Filled 2020-10-13: qty 6.7

## 2020-10-13 MED ORDER — ACETAMINOPHEN 325 MG PO TABS: 650.0000 mg | ORAL_TABLET | Freq: Four times a day (QID) | ORAL | Status: DC | PRN

## 2020-10-13 MED ORDER — GUAIFENESIN-DM 100-10 MG/5ML PO SYRP
10.0000 mL | ORAL_SOLUTION | ORAL | Status: DC | PRN
  Filled 2020-10-13 (×2): qty 10

## 2020-10-13 MED ORDER — SODIUM CHLORIDE 0.9 % IV SOLN
100.0000 mg | Freq: Every day | INTRAVENOUS | Status: AC
  Administered 2020-10-14 – 2020-10-17 (×4): 100 mg via INTRAVENOUS
  Filled 2020-10-13 (×4): qty 20

## 2020-10-13 MED ORDER — HYDROCODONE-ACETAMINOPHEN 5-325 MG PO TABS
1.0000 | ORAL_TABLET | ORAL | Status: DC | PRN
  Administered 2020-10-13 – 2020-10-16 (×5): 2 via ORAL
  Filled 2020-10-13 (×5): qty 2

## 2020-10-13 MED ORDER — SODIUM CHLORIDE 0.9 % IV SOLN: 100.0000 mg | Freq: Every day | INTRAVENOUS | Status: DC

## 2020-10-13 MED ORDER — DEXAMETHASONE SODIUM PHOSPHATE 10 MG/ML IJ SOLN
6.0000 mg | INTRAMUSCULAR | Status: DC
  Administered 2020-10-13 – 2020-10-14 (×2): 6 mg via INTRAVENOUS
  Filled 2020-10-13 (×2): qty 1

## 2020-10-13 MED ORDER — ONDANSETRON HCL 4 MG/2ML IJ SOLN: 4.0000 mg | Freq: Four times a day (QID) | INTRAMUSCULAR | Status: DC | PRN

## 2020-10-13 MED ORDER — INFLUENZA VAC SPLIT QUAD 0.5 ML IM SUSY
0.5000 mL | PREFILLED_SYRINGE | INTRAMUSCULAR | Status: DC
  Filled 2020-10-13: qty 0.5

## 2020-10-13 MED ORDER — SODIUM CHLORIDE 0.9 % IV SOLN: 200.0000 mg | Freq: Once | INTRAVENOUS | Status: DC

## 2020-10-13 MED ORDER — SENNOSIDES-DOCUSATE SODIUM 8.6-50 MG PO TABS: 1.0000 | ORAL_TABLET | Freq: Every evening | ORAL | Status: DC | PRN

## 2020-10-13 MED ORDER — ONDANSETRON HCL 4 MG PO TABS: 4.0000 mg | ORAL_TABLET | Freq: Four times a day (QID) | ORAL | Status: DC | PRN

## 2020-10-13 MED ORDER — HYDROCOD POLST-CPM POLST ER 10-8 MG/5ML PO SUER
5.0000 mL | Freq: Two times a day (BID) | ORAL | Status: DC | PRN
  Administered 2020-10-13 – 2020-10-15 (×4): 5 mL via ORAL
  Filled 2020-10-13 (×4): qty 5

## 2020-10-13 MED ORDER — SODIUM CHLORIDE 0.9 % IV SOLN
200.0000 mg | Freq: Once | INTRAVENOUS | Status: AC
  Administered 2020-10-13: 200 mg via INTRAVENOUS
  Filled 2020-10-13: qty 200

## 2020-10-13 MED ORDER — BISACODYL 5 MG PO TBEC: 5.0000 mg | DELAYED_RELEASE_TABLET | Freq: Every day | ORAL | Status: DC | PRN

## 2020-10-13 MED ORDER — ENOXAPARIN SODIUM 60 MG/0.6ML ~~LOC~~ SOLN
0.5000 mg/kg | SUBCUTANEOUS | Status: DC
  Administered 2020-10-13 – 2020-10-16 (×5): 52.5 mg via SUBCUTANEOUS
  Filled 2020-10-13 (×6): qty 0.6

## 2020-10-13 NOTE — ED Notes (Signed)
Patient assisted onto and off of bedside commode.  

## 2020-10-13 NOTE — Progress Notes (Signed)
Remdesivir - Pharmacy Brief Note   O:  ALT: 44 CXR: Patchy airspace opacities at the periphery of both lower lungs, likely consistent with multifocal pneumonia. SpO2: 92% on 2 L [place]   A/P:  Remdesivir 200 mg IVPB once followed by 100 mg IVPB daily x 4 days.   Laureen Ochs, PharmD 10/13/2020 12:01 AM

## 2020-10-13 NOTE — Progress Notes (Signed)
Patient admitted early this morning for Covid pneumonia, she is seen and examined Currently continue to have cough, she is on 3 L oxygen, does not appear in acute distress,  Continue current regimen.  she declined my offer to update her husband.

## 2020-10-13 NOTE — H&P (Signed)
History and Physical    Brittany AlbeDonna P Milewski QMV:[id number] DOB: 1961-04-13 DOA: 10/12/2020  PCP: Marisue IvanLinthavong, Kanhka, MD   Patient coming from: home  I have personally briefly reviewed patient's [facility]  Chief Complaint: Shortness of breath  HPI: Brittany Cain is a 59 y.o. female with medical history significant for hypertension, depression, chronic back pain, GERD, obesity class III, who presents to the emergency room with a 1 week history of diarrhea, fever of up to 103, body aches, now with shortness of breath that has been progressively worsening.  She is unvaccinated against Covid but denies exposure.  She denies chest pain, leg pain or swelling. ED Course: On arrival she was afebrile, tachypneic at 22 with O2 sat 88% on room air.  Pulse 74, BP 118/71.  Covid positive.  Flu negative.  Blood work unremarkable which included CBC, CMP, lactic acid and troponin.  D-dimer pending.  CXR with patchy airspace opacities consistent with multifocal pneumonia EKG as reviewed by me : NSR at 81 with no acute ST-T wave changes Hospitalist consulted for admission  Review of Systems: As per HPI otherwise all other systems on review of systems negative.    Past Medical History:  Diagnosis Date  . Anxiety   . Chicken pox   . Chronic back pain   . Depression   . GERD (gastroesophageal reflux disease)   . History of kidney stones   . Hypertension   . Obesity   . Restless leg syndrome     Past Surgical History:  Procedure Laterality Date  . ABDOMINAL HYSTERECTOMY    . BACK SURGERY     lower back; metal in lower back  . CHOLECYSTECTOMY    . CYSTOSCOPY/URETEROSCOPY/HOLMIUM LASER/STENT PLACEMENT Left 10/31/2019   Procedure: ,left stent placement,left retrograde pyelogram,cystoscopy;  Surgeon: Orson ApeWolff, Michael R, MD;  Location: [facility] ORS;  Service: Urology;  Laterality: Left;  . ESOPHAGOGASTRODUODENOSCOPY (EGD) WITH PROPOFOL N/A 05/24/2017   Procedure:  ESOPHAGOGASTRODUODENOSCOPY (EGD) WITH PROPOFOL;  Surgeon: Christena DeemSkulskie, Martin U, MD;  [facility];  Service: Endoscopy;  Laterality: N/A;  . EXTRACORPOREAL SHOCK WAVE LITHOTRIPSY Left 10/29/2019   Procedure: EXTRACORPOREAL SHOCK WAVE LITHOTRIPSY (ESWL);  Surgeon: Orson ApeWolff, Michael R, MD;  Location: [facility] ORS;  Service: Urology;  Laterality: Left;  . INCISIONAL HERNIA REPAIR N/A 06/01/2019   Procedure: LAPAROSCOPIC INCISIONAL HERNIA REPAIR WITH MESH;  Surgeon: Carolan Shiverintron-Diaz, Edgardo, MD;  Location: [facility] ORS;  Service: General;  Laterality: N/A;     reports that she has never smoked. She has never used smokeless tobacco. She reports that she does not drink alcohol and does not use drugs.  Allergies  Allergen Reactions  . Aciphex [Rabeprazole] Swelling  . Bactrim [Sulfamethoxazole-Trimethoprim] Diarrhea and Nausea Only  . Ceftin [Cefuroxime Axetil] Hives  . Protonix [Pantoprazole] Hives and Swelling  . Vilazodone Hcl Hives and Other (See Comments)    Tongue swelling    Family History  Problem Relation Age of Onset  . Stroke Mother   . Prostate cancer Father   . Breast cancer Neg Hx       Prior to Admission medications   Medication Sig Start Date End Date Taking? Authorizing Provider  APAP-Pamabrom-Pyrilamine (PAMPRIN MULTI-SYMPTOM) 500-25-15 MG TABS Take 2 tablets by mouth at bedtime.    [provider]  atorvastatin (LIPITOR) 40 MG tablet Take 40 mg by mouth at bedtime.     [provider]  buPROPion (WELLBUTRIN SR) 200 MG 12 hr tablet Take 200 mg by mouth 2 (  two) times daily.    [provider]  Cholecalciferol (VITAMIN D3) 125 MCG (5000 UT) CAPS Take 5,000 Units by mouth daily.    [provider]  cyanocobalamin 1000 MCG tablet Take 1,000 mcg by mouth daily.    [provider]  famotidine (PEPCID) 20 MG tablet Take 20 mg by mouth 2 (two) times daily.    [provider]  HYDROcodone-acetaminophen (NORCO) 10-325 MG tablet Take  1 tablet by mouth every 4 (four) hours as needed for moderate pain. Maximum dose per 24 hours -8 pills. 10/29/19   Orson Ape, MD  loratadine (CLARITIN) 10 MG tablet Take 10 mg by mouth daily.    [provider]  losartan (COZAAR) 100 MG tablet Take 100 mg by mouth daily.    [provider]  nitrofurantoin (MACRODANTIN) 100 MG capsule Take 1 capsule (100 mg total) by mouth 2 (two) times daily. 10/31/19   Orson Ape, MD  NUCYNTA 50 MG tablet Take 1 tablet (50 mg total) by mouth every 6 (six) hours as needed for severe pain. 1 TO 2 TABS Q 6 HOURS PRN PAIN 10/31/19   Orson Ape, MD  omeprazole (PRILOSEC) 20 MG capsule Take 20 mg by mouth daily.    [provider]  ondansetron (ZOFRAN ODT) 8 MG disintegrating tablet Take 1 tablet (8 mg total) by mouth every 6 (six) hours as needed for nausea or vomiting. 10/29/19   Orson Ape, MD  promethazine (PHENERGAN) 25 MG tablet Take 25 mg by mouth as directed. 10/21/19   [provider]  pyridOXINE (VITAMIN B-6) 50 MG tablet Take 50 mg by mouth 2 (two) times daily.    [provider]  rOPINIRole (REQUIP) 0.25 MG tablet Take 1 tablet (0.25 mg total) by mouth at bedtime. 03/31/14   Tamala Julian, PA-C  tamsulosin (FLOMAX) 0.4 MG CAPS capsule Take 1 capsule (0.4 mg total) by mouth daily. 10/29/19   Orson Ape, MD    Physical Exam: Vitals:   10/12/20 1949 10/12/20 1950 10/12/20 1952 10/12/20 2339  BP:   118/71 125/68  Pulse: 74   78  Resp: (!) 22   20  Temp: 98.4 F (36.9 C)   98.6 F (37 C)  TempSrc: Oral   Oral  SpO2:   (!) 88% 95%  Weight:  105.7 kg    Height:  5\' 2"  (1.575 m)       Vitals:   10/12/20 1949 10/12/20 1950 10/12/20 1952 10/12/20 2339  BP:   118/71 125/68  Pulse: 74   78  Resp: (!) 22   20  Temp: 98.4 F (36.9 C)   98.6 F (37 C)  TempSrc: Oral   Oral  SpO2:   (!) 88% 95%  Weight:  105.7 kg    Height:  5\' 2"  (1.575 m)        Constitutional: Alert  and oriented x 3 .  Conversational dyspnea and speaking in short sentences HEENT:      Head: Normocephalic and atraumatic.         Eyes: PERLA, EOMI, Conjunctivae are normal. Sclera is non-icteric.       Mouth/Throat: Mucous membranes are moist.       Neck: Supple with no signs of meningismus. Cardiovascular: Regular rate and rhythm. No murmurs, gallops, or rubs. 2+ symmetrical distal pulses are present . No JVD. No LE edema Respiratory: Respiratory effort increased with coarse breath sounds bilaterally. Gastrointestinal: Soft, non tender, and non  distended with positive bowel sounds. No rebound or guarding. Genitourinary: No CVA tenderness. Musculoskeletal: Nontender with normal range of motion in all extremities. No cyanosis, or erythema of extremities. Neurologic:  Face is symmetric. Moving all extremities. No gross focal neurologic deficits . Skin: Skin is warm, dry.  No rash or ulcers Psychiatric: Mood and affect are normal    Labs on Admission: I have personally reviewed following labs and imaging studies  CBC: Recent Labs  Lab 10/12/20 2000  WBC 6.7  HGB 14.5  HCT 43.2  MCV 89.8  PLT 441*   Basic Metabolic Panel: Recent Labs  Lab 10/12/20 2000  NA 142  K 3.8  CL 105  CO2 25  GLUCOSE 115*  BUN 19  CREATININE 0.84  CALCIUM 8.7*   GFR: Estimated Creatinine Clearance: 82.3 mL/min (by C-G formula based on SCr of 0.84 mg/dL). Liver Function Tests: Recent Labs  Lab 10/12/20 2000  AST 42*  ALT 44  ALKPHOS 60  BILITOT 0.7  PROT 7.2  ALBUMIN 3.4*   No results for input(s): LIPASE, AMYLASE in the last 168 hours. No results for input(s): AMMONIA in the last 168 hours. Coagulation Profile: No results for input(s): INR, PROTIME in the last 168 hours. Cardiac Enzymes: No results for input(s): CKTOTAL, CKMB, CKMBINDEX, TROPONINI in the last 168 hours. BNP (last 3 results) No results for input(s): PROBNP in the last 8760 hours. HbA1C: No results for input(s):  HGBA1C in the last 72 hours. CBG: No results for input(s): GLUCAP in the last 168 hours. Lipid Profile: No results for input(s): CHOL, HDL, LDLCALC, TRIG, CHOLHDL, LDLDIRECT in the last 72 hours. Thyroid Function Tests: No results for input(s): TSH, T4TOTAL, FREET4, T3FREE, THYROIDAB in the last 72 hours. Anemia Panel: No results for input(s): VITAMINB12, FOLATE, FERRITIN, TIBC, IRON, RETICCTPCT in the last 72 hours. Urine analysis:    Component Value Date/Time   COLORURINE YELLOW (A) 10/31/2019 1032   APPEARANCEUR HAZY (A) 10/31/2019 1032   APPEARANCEUR Hazy 04/23/2014 1234   LABSPEC 1.017 10/31/2019 1032   LABSPEC 1.006 04/23/2014 1234   PHURINE 5.0 10/31/2019 1032   GLUCOSEU NEGATIVE 10/31/2019 1032   GLUCOSEU Negative 04/23/2014 1234   HGBUR LARGE (A) 10/31/2019 1032   BILIRUBINUR NEGATIVE 10/31/2019 1032   BILIRUBINUR Negative 04/23/2014 1234   KETONESUR 5 (A) 10/31/2019 1032   PROTEINUR 30 (A) 10/31/2019 1032   UROBILINOGEN 1.0 06/26/2013 2049   NITRITE NEGATIVE 10/31/2019 1032   LEUKOCYTESUR MODERATE (A) 10/31/2019 1032   LEUKOCYTESUR 1+ 04/23/2014 1234    Radiological Exams on Admission: DG Chest 2 View  Result Date: 10/12/2020 CLINICAL DATA:  Shortness of breath EXAM: CHEST - 2 VIEW COMPARISON:  None. FINDINGS: The heart size and mediastinal contours are within normal limits. Patchy airspace opacities are seen at the periphery of both lower lungs. The visualized skeletal structures are unremarkable. IMPRESSION: Patchy airspace opacities at the periphery of both lower lungs, likely consistent with multifocal pneumonia. Electronically Signed   By: Jonna Clark M.D.   On: 10/12/2020 20:22     Assessment/Plan 59 year old female with history of hypertension, depression, chronic back pain, GERD, obesity class III, who presents to the emergency room with a 1 week history of diarrhea, fever of up to 103, body aches, now with shortness of breath that has been progressively  worsening.  Covid positive with typical chest x-ray findings    Acute respiratory failure due to COVID-19 [facility])   Pneumonia due to COVID-19 virus   Gastroenteritis due to COVID-19  virus -O2 sat in the ER 88% on room air requiring 2 L to bring sats up to the mid 90s, increased work of breathing -Remdesivir, Decadron, albuterol, antitussives and vitamins -Proning as tolerated per protocol -Follow D-dimer and inflammatory biomarkers -Supportive care    Essential hypertension -Continue home losartan    Obesity, Class III, BMI 40-49.9 (morbid obesity) (HCC) -Risk factor for severe disease    Chronic back pain -As needed Norco    Depression -Continue home Wellbutrin    DVT prophylaxis: Lovenox  Code Status: full code  Family Communication:  none  Disposition Plan: Back to previous home environment Consults called: none  Status:At the time of admission, it appears that the appropriate admission status for this patient is INPATIENT. This is judged to be reasonable and necessary in order to provide the required intensity of service to ensure the patient's safety given the presenting symptoms, physical exam findings, and initial radiographic and laboratory data in the context of their  Comorbid conditions.   Patient requires inpatient status due to high intensity of service, high risk for further deterioration and high frequency of surveillance required.   I certify that at the point of admission it is my clinical judgment that the patient will require inpatient hospital care spanning beyond 2 midnights     Andris Baumann MD [facility]     10/13/2020, 12:42 AM

## 2020-10-14 DIAGNOSIS — J1282 Pneumonia due to coronavirus disease 2019: Secondary | ICD-10-CM

## 2020-10-14 DIAGNOSIS — R7401 Elevation of levels of liver transaminase levels: Secondary | ICD-10-CM

## 2020-10-14 DIAGNOSIS — A0839 Other viral enteritis: Secondary | ICD-10-CM

## 2020-10-14 DIAGNOSIS — D75839 Thrombocytosis, unspecified: Secondary | ICD-10-CM

## 2020-10-14 DIAGNOSIS — I10 Essential (primary) hypertension: Secondary | ICD-10-CM

## 2020-10-14 LAB — CBC WITH DIFFERENTIAL/PLATELET
Abs Immature Granulocytes: 0.03 10*3/uL (ref 0.00–0.07)
Basophils Absolute: 0 10*3/uL (ref 0.0–0.1)
Basophils Relative: 0 %
Eosinophils Absolute: 0 10*3/uL (ref 0.0–0.5)
Eosinophils Relative: 0 %
HCT: 39.5 % (ref 36.0–46.0)
Hemoglobin: 13.1 g/dL (ref 12.0–15.0)
Immature Granulocytes: 1 %
Lymphocytes Relative: 21 %
Lymphs Abs: 1.1 10*3/uL (ref 0.7–4.0)
MCH: 30 pg (ref 26.0–34.0)
MCHC: 33.2 g/dL (ref 30.0–36.0)
MCV: 90.6 fL (ref 80.0–100.0)
Monocytes Absolute: 0.7 10*3/uL (ref 0.1–1.0)
Monocytes Relative: 14 %
Neutro Abs: 3.4 10*3/uL (ref 1.7–7.7)
Neutrophils Relative %: 64 %
Platelets: 455 10*3/uL — ABNORMAL HIGH (ref 150–400)
RBC: 4.36 MIL/uL (ref 3.87–5.11)
RDW: 14 % (ref 11.5–15.5)
Smear Review: NORMAL
WBC: 5.2 10*3/uL (ref 4.0–10.5)
nRBC: 0 % (ref 0.0–0.2)

## 2020-10-14 LAB — COMPREHENSIVE METABOLIC PANEL
ALT: 66 U/L — ABNORMAL HIGH (ref 0–44)
AST: 62 U/L — ABNORMAL HIGH (ref 15–41)
Albumin: 3.1 g/dL — ABNORMAL LOW (ref 3.5–5.0)
Alkaline Phosphatase: 52 U/L (ref 38–126)
Anion gap: 11 (ref 5–15)
BUN: 25 mg/dL — ABNORMAL HIGH (ref 6–20)
CO2: 26 mmol/L (ref 22–32)
Calcium: 8.5 mg/dL — ABNORMAL LOW (ref 8.9–10.3)
Chloride: 103 mmol/L (ref 98–111)
Creatinine, Ser: 0.75 mg/dL (ref 0.44–1.00)
GFR, Estimated: >60 mL/min (ref >60)
Glucose, Bld: 151 mg/dL — ABNORMAL HIGH (ref 70–99)
Potassium: 3.9 mmol/L (ref 3.5–5.1)
Sodium: 140 mmol/L (ref 135–145)
Total Bilirubin: 0.8 mg/dL (ref 0.3–1.2)
Total Protein: 6.2 g/dL — ABNORMAL LOW (ref 6.5–8.1)

## 2020-10-14 LAB — FIBRIN DERIVATIVES D-DIMER (ARMC ONLY): Fibrin derivatives D-dimer (ARMC): 425.57 ng/mL (FEU) (ref 0.00–499.00)

## 2020-10-14 LAB — C-REACTIVE PROTEIN: CRP: 0.8 mg/dL (ref <1.0)

## 2020-10-14 MED ORDER — VITAMIN B-12 1000 MCG PO TABS
1000.0000 ug | ORAL_TABLET | Freq: Every day | ORAL | Status: DC
  Administered 2020-10-15 – 2020-10-17 (×3): 1000 ug via ORAL
  Filled 2020-10-14 (×3): qty 1

## 2020-10-14 MED ORDER — METHYLPREDNISOLONE SODIUM SUCC 125 MG IJ SOLR
60.0000 mg | Freq: Two times a day (BID) | INTRAMUSCULAR | Status: DC
  Administered 2020-10-14 – 2020-10-16 (×4): 60 mg via INTRAVENOUS
  Filled 2020-10-14 (×4): qty 2

## 2020-10-14 MED ORDER — ENSURE ENLIVE PO LIQD
237.0000 mL | Freq: Three times a day (TID) | ORAL | Status: DC
  Administered 2020-10-14 – 2020-10-17 (×8): 237 mL via ORAL

## 2020-10-14 MED ORDER — THYROID 60 MG PO TABS
60.0000 mg | ORAL_TABLET | Freq: Every day | ORAL | Status: DC
  Filled 2020-10-14: qty 1

## 2020-10-14 MED ORDER — ATORVASTATIN CALCIUM 20 MG PO TABS
40.0000 mg | ORAL_TABLET | Freq: Every day | ORAL | Status: DC
  Administered 2020-10-14 – 2020-10-16 (×3): 40 mg via ORAL
  Filled 2020-10-14 (×3): qty 2

## 2020-10-14 MED ORDER — BUPROPION HCL ER (SR) 100 MG PO TB12
200.0000 mg | ORAL_TABLET | Freq: Two times a day (BID) | ORAL | Status: DC
  Administered 2020-10-14 – 2020-10-16 (×4): 200 mg via ORAL
  Administered 2020-10-16: 100 mg via ORAL
  Administered 2020-10-17: 200 mg via ORAL
  Filled 2020-10-14 (×7): qty 2

## 2020-10-14 MED ORDER — VITAMIN B-6 50 MG PO TABS
50.0000 mg | ORAL_TABLET | Freq: Two times a day (BID) | ORAL | Status: DC
  Administered 2020-10-14 – 2020-10-17 (×6): 50 mg via ORAL
  Filled 2020-10-14 (×7): qty 1

## 2020-10-14 MED ORDER — FAMOTIDINE 20 MG PO TABS
20.0000 mg | ORAL_TABLET | Freq: Two times a day (BID) | ORAL | Status: DC
  Administered 2020-10-14 – 2020-10-17 (×6): 20 mg via ORAL
  Filled 2020-10-14 (×6): qty 1

## 2020-10-14 MED ORDER — ROPINIROLE HCL 0.25 MG PO TABS
0.2500 mg | ORAL_TABLET | Freq: Every day | ORAL | Status: DC
  Administered 2020-10-14 – 2020-10-16 (×3): 0.25 mg via ORAL
  Filled 2020-10-14 (×4): qty 1

## 2020-10-14 MED ORDER — ASCORBIC ACID 500 MG PO TABS
500.0000 mg | ORAL_TABLET | Freq: Every day | ORAL | Status: DC
  Administered 2020-10-14 – 2020-10-17 (×4): 500 mg via ORAL
  Filled 2020-10-14 (×4): qty 1

## 2020-10-14 MED ORDER — ADULT MULTIVITAMIN W/MINERALS CH
1.0000 | ORAL_TABLET | Freq: Every day | ORAL | Status: DC
  Administered 2020-10-15 – 2020-10-17 (×3): 1 via ORAL
  Filled 2020-10-14 (×3): qty 1

## 2020-10-14 MED ORDER — LORATADINE 10 MG PO TABS
10.0000 mg | ORAL_TABLET | Freq: Every day | ORAL | Status: DC
  Administered 2020-10-14 – 2020-10-17 (×4): 10 mg via ORAL
  Filled 2020-10-14 (×4): qty 1

## 2020-10-14 MED ORDER — ZINC SULFATE 220 (50 ZN) MG PO CAPS
220.0000 mg | ORAL_CAPSULE | Freq: Every day | ORAL | Status: DC
  Administered 2020-10-14 – 2020-10-17 (×4): 220 mg via ORAL
  Filled 2020-10-14 (×4): qty 1

## 2020-10-14 MED ORDER — VITAMIN D3 25 MCG (1000 UNIT) PO TABS
5000.0000 [IU] | ORAL_TABLET | Freq: Every day | ORAL | Status: DC
  Administered 2020-10-15 – 2020-10-17 (×3): 5000 [IU] via ORAL
  Filled 2020-10-14 (×7): qty 5

## 2020-10-14 NOTE — Progress Notes (Addendum)
PROGRESS NOTE    Brittany Cain  BJY:[id number] DOB: May 31, 1961 DOA: 10/12/2020 PCP: Marisue Ivan, MD    Chief Complaint  Patient presents with  . Shortness of Breath  . Diarrhea    Brief Narrative:  Brittany Cain is a 59 y.o. female with medical history significant for hypertension, depression, chronic back pain, GERD, obesity class III, who presents to the emergency room with a 1 week history of diarrhea, fever of up to 103, body aches, now with shortness of breath that has been progressively worsening.  She is unvaccinated against Covid but denies exposure  Subjective:  She remains the same, on 2 L oxygen supplement  Assessment & Plan:   Principal Problem:   Acute respiratory failure due to COVID-19 [facility]) Active Problems:   Essential hypertension   Pneumonia due to COVID-19 virus   Gastroenteritis due to COVID-19 virus   Obesity, Class III, BMI 40-49.9 (morbid obesity) (HCC)   Chronic back pain   Depression   Acute hypoxic respiratory failure due to COVID-19 infection COVID-19 pneumonia -She has both respiratory symptoms and GI symptoms -O2 sat in the ER 88% on room air requiring 2 L to bring sats up to the mid 90s, increased work of breathing -Chest x-ray personally reviewed showed bilateral lower lung infiltrate consistent with multifocal pneumonia - continue remdesivir, change Decadron to Solu-Medrol, start vitamin C and zinc supplement, Lovenox for DVT prophylaxis at 0.5 mg/kg every 24 hours -Monitor procalcitonin, CRP, ddimer  Transaminitis Likely from current COVID-19 infection  add on hepatitis panel Monitor LFT  Thrombocytosis likely reactive  Hypertension; home meds losartan was not restarted on admission, currently normotensive, will continue hold losartan  Hyperlipidemia: Continue statin  Hypothyroidism: Continue supplement, check TSH  Class III obesity Body mass index is 42.62 kg/m.    DVT prophylaxis: Lovenox   Code Status:  Full Family Communication: Patient Disposition:   Status is: Inpatient  Dispo: The patient is from: Home              Anticipated d/c is to: Home              Anticipated d/c date is: TBD                Consultants:   None  Procedures:   None  Antimicrobials:   Antiviral remdesivir     Objective: Vitals:   10/14/20 1303 10/14/20 1500 10/14/20 1621 10/14/20 1651  BP: 135/73  (!) 141/83   Pulse: (!) 57  (!) 56 72  Resp: 20  16   Temp: 97.8 F (36.6 C)  97.9 F (36.6 C)   TempSrc: Oral  Oral   SpO2: 97% 93% 94% 90%  Weight:      Height:        Intake/Output Summary (Last 24 hours) at 10/14/2020 1720 Last data filed at 10/14/2020 1503 Gross per 24 hour  Intake 100 ml  Output --  Net 100 ml   Filed Weights   10/12/20 1950  Weight: 105.7 kg    Examination:  General exam: calm, NAD Respiratory system: Diminished, no wheezing, no rales, no rhonchi.  Still have intermittent tachypnea but overall appear slightly improved Cardiovascular system: S1 & S2 heard, RRR. No pedal edema. Gastrointestinal system: Protuberant abdomen , soft and nontender.  Normal bowel sounds heard. Central nervous system: Alert and oriented. No focal neurological deficits. Extremities: Symmetric 5 x 5 power. Skin: No rashes, lesions or ulcers Psychiatry: Judgement and insight appear normal. Mood & affect  appropriate.     Data Reviewed: I have personally reviewed following labs and imaging studies  CBC: Recent Labs  Lab 10/12/20 2000 10/13/20 0229 10/14/20 0414  WBC 6.7 9.8 5.2  NEUTROABS  --   --  3.4  HGB 14.5 14.0 13.1  HCT 43.2 42.3 39.5  MCV 89.8 90.4 90.6  PLT 441* 444* 455*    Basic Metabolic Panel: Recent Labs  Lab 10/12/20 2000 10/13/20 0229 10/14/20 0414  NA 142  --  140  K 3.8  --  3.9  CL 105  --  103  CO2 25  --  26  GLUCOSE 115*  --  151*  BUN 19  --  25*  CREATININE 0.84 0.85 0.75  CALCIUM 8.7*  --  8.5*    GFR: Estimated Creatinine  Clearance: 86.4 mL/min (by C-G formula based on SCr of 0.75 mg/dL).  Liver Function Tests: Recent Labs  Lab 10/12/20 2000 10/14/20 0414  AST 42* 62*  ALT 44 66*  ALKPHOS 60 52  BILITOT 0.7 0.8  PROT 7.2 6.2*  ALBUMIN 3.4* 3.1*    CBG: No results for input(s): GLUCAP in the last 168 hours.   Recent Results (from the past 240 hour(s))  Respiratory Panel by RT PCR (Flu A&B, Covid) - Nasopharyngeal Swab     Status: Abnormal   Collection Time: 10/12/20  8:00 PM   Specimen: Nasopharyngeal Swab  Result Value Ref Range Status   SARS Coronavirus 2 by RT PCR POSITIVE (A) NEGATIVE Final    Comment: RESULT CALLED TO, READ BACK BY AND VERIFIED WITH: LISA THOMPSON AT 2151 ON 10/12/20 BY SS (NOTE) SARS-CoV-2 target nucleic acids are DETECTED.  SARS-CoV-2 RNA is generally detectable in upper respiratory specimens  during the acute phase of infection. Positive results are indicative of the presence of the identified virus, but do not rule out bacterial infection or co-infection with other pathogens not detected by the test. Clinical correlation with patient history and other diagnostic information is necessary to determine patient infection status. The expected result is Negative.  Fact Sheet for Patients:  https://www.moore.com/  Fact Sheet for Healthcare Providers: https://www.young.biz/  This test is not yet approved or cleared by the [place] FDA and  has been authorized for detection and/or diagnosis of SARS-CoV-2 by FDA under an Emergency Use Authorization (EUA).  This EUA will remain in effect (meaning this test ca n be used) for the duration of  the COVID-19 declaration under Section 564(b)(1) of the Act, 21 U.S.C. section 360bbb-3(b)(1), unless the authorization is terminated or revoked sooner.      Influenza A by PCR NEGATIVE NEGATIVE Final   Influenza B by PCR NEGATIVE NEGATIVE Final    Comment: (NOTE) The Xpert Xpress  SARS-CoV-2/FLU/RSV assay is intended as an aid in  the diagnosis of influenza from Nasopharyngeal swab specimens and  should not be used as a sole basis for treatment. Nasal washings and  aspirates are unacceptable for Xpert Xpress SARS-CoV-2/FLU/RSV  testing.  Fact Sheet for Patients: https://www.moore.com/  Fact Sheet for Healthcare Providers: https://www.young.biz/  This test is not yet approved or cleared by the [place] FDA and  has been authorized for detection and/or diagnosis of SARS-CoV-2 by  FDA under an Emergency Use Authorization (EUA). This EUA will remain  in effect (meaning this test can be used) for the duration of the  Covid-19 declaration under Section 564(b)(1) of the Act, 21  U.S.C. section 360bbb-3(b)(1), unless the authorization is  terminated or revoked. Performed  at [facility], [address] [place], [place] [address]          Radiology Studies: DG Chest 2 View  Result Date: 10/12/2020 CLINICAL DATA:  Shortness of breath EXAM: CHEST - 2 VIEW COMPARISON:  None. FINDINGS: The heart size and mediastinal contours are within normal limits. Patchy airspace opacities are seen at the periphery of both lower lungs. The visualized skeletal structures are unremarkable. IMPRESSION: Patchy airspace opacities at the periphery of both lower lungs, likely consistent with multifocal pneumonia. Electronically Signed   By: Jonna Clark M.D.   On: 10/12/2020 20:22        Scheduled Meds: . albuterol  2 puff Inhalation Q6H  . enoxaparin (LOVENOX) injection  0.5 mg/kg Subcutaneous Q24H  . feeding supplement  237 mL Oral TID BM  . influenza vac split quadrivalent PF  0.5 mL Intramuscular Tomorrow-1000  . methylPREDNISolone (SOLU-MEDROL) injection  60 mg Intravenous Q12H  . [START ON 10/15/2020] multivitamin with minerals  1 tablet Oral Daily   Continuous Infusions: . remdesivir 100 mg in NS 100 mL Stopped  (10/14/20 0810)     LOS: 1 day   Time spent: Greater than 50% of this time was spent in counseling, explanation of diagnosis, planning of further management, and coordination of care.  I have personally reviewed and interpreted on  10/14/2020 daily labs,I reviewed all nursing notes, pharmacy notes, vitals, pertinent old records  I have discussed plan of care as described above with RN , patient on 10/14/2020  Voice Recognition /Dragon dictation system was used to create this note, attempts have been made to correct errors. Please contact the author with questions and/or clarifications.   Albertine Grates, MD PhD FACP [facility]  Available via Epic secure chat 7am-7pm for nonurgent issues Please page for urgent issues To page the attending provider between 7A-7P or the covering provider during after hours 7P-7A, please log into the web site [email or web address] and access using universal [place] password for that web site. If you do not have the password, please call the hospital operator.    10/14/2020, 5:20 PM

## 2020-10-14 NOTE — Progress Notes (Signed)
Initial Nutrition Assessment  DOCUMENTATION CODES:   Obesity unspecified  INTERVENTION:   Ensure Enlive po TID, each supplement provides 350 kcal and 20 grams of protein  MVI daily   NUTRITION DIAGNOSIS:   Increased nutrient needs related to catabolic illness (COVID 19) as evidenced by increased estimated needs.  GOAL:   Patient will meet greater than or equal to 90% of their needs  MONITOR:   PO intake, Supplement acceptance, Labs, Weight trends, I & O's, Skin  REASON FOR ASSESSMENT:   Malnutrition Screening Tool    ASSESSMENT:   59 y.o. female with medical history significant for hypertension, depression, chronic back pain, GERD and obesity class III who is admitted with COVID 19.   Unable to reach pt by phone. Pt with increased estimated needs r/t COVID 19. RD suspects pt with decreased appetite and oral intake pta. RD will add supplements and MVI to help pt meet her estimated needs. Per chart, pt appears weight stable at baseline. RD will obtain nutrition related history at follow-up.   Medications reviewed and include: dexamethasone, lovenox  Labs reviewed: BUN 25(H), AST 62(H), ALT 66(H)  NUTRITION - FOCUSED PHYSICAL EXAM: Unable to perform at this time   Diet Order:   Diet Order            Diet Heart Room service appropriate? Yes; Fluid consistency: Thin  Diet effective now                EDUCATION NEEDS:   Education needs have been addressed  Skin:  Skin Assessment: Reviewed RN Assessment  Last BM:  10/28  Height:   Ht Readings from Last 1 Encounters:  10/12/20 5\' 2"  (1.575 m)    Weight:   Wt Readings from Last 1 Encounters:  10/12/20 105.7 kg    Ideal Body Weight:  50 kg  BMI:  Body mass index is 42.62 kg/m.  Estimated Nutritional Needs:   Kcal:  2100-2400kcal/day  Protein:  105-120g/day  Fluid:  1.5-1.8L/day  10/14/20 MS, RD, LDN Please refer to [facility] for RD and/or RD on-call/weekend/after hours pager

## 2020-10-15 LAB — COMPREHENSIVE METABOLIC PANEL
ALT: 77 U/L — ABNORMAL HIGH (ref 0–44)
AST: 43 U/L — ABNORMAL HIGH (ref 15–41)
Albumin: 3 g/dL — ABNORMAL LOW (ref 3.5–5.0)
Alkaline Phosphatase: 52 U/L (ref 38–126)
Anion gap: 7 (ref 5–15)
BUN: 24 mg/dL — ABNORMAL HIGH (ref 6–20)
CO2: 27 mmol/L (ref 22–32)
Calcium: 8.4 mg/dL — ABNORMAL LOW (ref 8.9–10.3)
Chloride: 105 mmol/L (ref 98–111)
Creatinine, Ser: 0.66 mg/dL (ref 0.44–1.00)
GFR, Estimated: >60 mL/min (ref >60)
Glucose, Bld: 163 mg/dL — ABNORMAL HIGH (ref 70–99)
Potassium: 3.8 mmol/L (ref 3.5–5.1)
Sodium: 139 mmol/L (ref 135–145)
Total Bilirubin: 0.9 mg/dL (ref 0.3–1.2)
Total Protein: 6 g/dL — ABNORMAL LOW (ref 6.5–8.1)

## 2020-10-15 LAB — CBC WITH DIFFERENTIAL/PLATELET
Abs Immature Granulocytes: 0.15 10*3/uL — ABNORMAL HIGH (ref 0.00–0.07)
Basophils Absolute: 0 10*3/uL (ref 0.0–0.1)
Basophils Relative: 0 %
Eosinophils Absolute: 0 10*3/uL (ref 0.0–0.5)
Eosinophils Relative: 0 %
HCT: 38.1 % (ref 36.0–46.0)
Hemoglobin: 12.8 g/dL (ref 12.0–15.0)
Immature Granulocytes: 2 %
Lymphocytes Relative: 14 %
Lymphs Abs: 1 10*3/uL (ref 0.7–4.0)
MCH: 30.4 pg (ref 26.0–34.0)
MCHC: 33.6 g/dL (ref 30.0–36.0)
MCV: 90.5 fL (ref 80.0–100.0)
Monocytes Absolute: 0.4 10*3/uL (ref 0.1–1.0)
Monocytes Relative: 5 %
Neutro Abs: 5.6 10*3/uL (ref 1.7–7.7)
Neutrophils Relative %: 79 %
Platelets: 462 10*3/uL — ABNORMAL HIGH (ref 150–400)
RBC: 4.21 MIL/uL (ref 3.87–5.11)
RDW: 13.6 % (ref 11.5–15.5)
WBC: 7.2 10*3/uL (ref 4.0–10.5)
nRBC: 0.3 % — ABNORMAL HIGH (ref 0.0–0.2)

## 2020-10-15 LAB — HEPATITIS PANEL, ACUTE
HCV Ab: NONREACTIVE
Hep A IgM: NONREACTIVE
Hep B C IgM: NONREACTIVE
Hepatitis B Surface Ag: NONREACTIVE

## 2020-10-15 LAB — FIBRIN DERIVATIVES D-DIMER (ARMC ONLY): Fibrin derivatives D-dimer (ARMC): 367.71 ng/mL (FEU) (ref 0.00–499.00)

## 2020-10-15 LAB — PROCALCITONIN: Procalcitonin: <0.1 ng/mL

## 2020-10-15 LAB — TSH: TSH: 0.155 u[IU]/mL — ABNORMAL LOW (ref 0.350–4.500)

## 2020-10-15 LAB — C-REACTIVE PROTEIN: CRP: 0.6 mg/dL (ref <1.0)

## 2020-10-15 MED ORDER — THYROID 30 MG PO TABS
30.0000 mg | ORAL_TABLET | Freq: Every day | ORAL | Status: DC
  Administered 2020-10-15 – 2020-10-17 (×3): 30 mg via ORAL
  Filled 2020-10-15 (×3): qty 1

## 2020-10-15 NOTE — Plan of Care (Signed)
  Problem: Education: Goal: Knowledge of risk factors and measures for prevention of condition will improve Outcome: Progressing   Problem: Coping: Goal: Psychosocial and spiritual needs will be supported Outcome: Progressing   Problem: Respiratory: Goal: Will maintain a patent airway Outcome: Progressing Goal: Complications related to the disease process, condition or treatment will be avoided or minimized Outcome: Progressing   Problem: Education: Goal: Knowledge of General Education information will improve Description: Including pain rating scale, medication(s)/side effects and non-pharmacologic comfort measures Outcome: Progressing   Problem: Clinical Measurements: Goal: Diagnostic test results will improve Outcome: Progressing   Problem: Activity: Goal: Risk for activity intolerance will decrease Outcome: Progressing   Problem: Pain Managment: Goal: General experience of comfort will improve Outcome: Progressing   Problem: Safety: Goal: Ability to remain free from injury will improve Outcome: Progressing

## 2020-10-15 NOTE — Plan of Care (Signed)
°  Problem: Respiratory: Goal: Will maintain a patent airway Outcome: Progressing Note: Incentive Spirometer and Flutter Valve placed at bedside    Problem: Pain Managment: Goal: General experience of comfort will improve Outcome: Progressing   Problem: Safety: Goal: Ability to remain free from injury will improve Outcome: Progressing

## 2020-10-15 NOTE — Progress Notes (Signed)
PROGRESS NOTE    Brittany Cain  FAO:[id number] DOB: [birth year] DOA: 10/12/2020 PCP: Marisue Ivan, MD    Chief Complaint  Patient presents with  . Shortness of Breath  . Diarrhea    Brief Narrative:  Brittany Cain is a 59 y.o. female with medical history significant for hypertension, depression, chronic back pain, GERD, obesity class III, who presents to the emergency room with a 1 week history of diarrhea, fever of up to 103, body aches, now with shortness of breath that has been progressively worsening.  She is unvaccinated against Covid but denies exposure  Subjective:  She remains the same, on 2 L oxygen supplement She mild intermittent productive cough, denies chest pain, no edema   Assessment & Plan:   Principal Problem:   Acute respiratory failure due to COVID-19 [facility]) Active Problems:   Essential hypertension   Pneumonia due to COVID-19 virus   Gastroenteritis due to COVID-19 virus   Obesity, Class III, BMI 40-49.9 (morbid obesity) (HCC)   Chronic back pain   Depression   Acute hypoxic respiratory failure due to COVID-19 infection COVID-19 pneumonia -She has both respiratory symptoms and GI symptoms -O2 sat in the ER 88% on room air requiring 2 L to bring sats up to the mid 90s, increased work of breathing -Chest x-ray personally reviewed showed bilateral lower lung infiltrate consistent with multifocal pneumonia - continue remdesivir,  Decadron changed to Solu-Medrol, continue vitamin C and zinc supplement, Lovenox for DVT prophylaxis at 0.5 mg/kg every 24 hours - procalcitonin, CRP, ddimer has been stable  Transaminitis Likely from current COVID-19 infection   hepatitis panel negative  LFT start to improve  Thrombocytosis likely reactive, monitor  Hypertension; home meds losartan was not restarted on admission, currently normotensive, will continue hold losartan  Hyperlipidemia: Continue statin  Hypothyroidism: Continue supplement,  TSH  suppressed, decrease Armour thyroid dose from 60 mg daily to 30 mg daily  Class III obesity Body mass index is 42.62 kg/m.    DVT prophylaxis: Lovenox   Code Status: Full Family Communication: Husband over the phone Disposition:   Status is: Inpatient  Dispo: The patient is from: Home              Anticipated d/c is to: Home              Anticipated d/c date is: Hopefully on Monday after finish 5 dose of remdesivir                Consultants:   None  Procedures:   None  Antimicrobials:   Antiviral remdesivir     Objective: Vitals:   10/14/20 1621 10/14/20 1651 10/14/20 1955 10/15/20 0744  BP: (!) 141/83   (!) 152/81  Pulse: (!) 56 72  60  Resp: 16   18  Temp: 97.9 F (36.6 C)   98.1 F (36.7 C)  TempSrc: Oral   Oral  SpO2: 94% 90% 94% 94%  Weight:      Height:        Intake/Output Summary (Last 24 hours) at 10/15/2020 0817 Last data filed at 10/14/2020 1503 Gross per 24 hour  Intake 100 ml  Output --  Net 100 ml   Filed Weights   10/12/20 1950  Weight: 105.7 kg    Examination:  General exam: calm, NAD Respiratory system: Diminished, no wheezing, no rales, no rhonchi.  Still have intermittent tachypnea but overall appear slightly improved Cardiovascular system: S1 & S2 heard, RRR. No pedal edema. Gastrointestinal  system: Protuberant abdomen , soft and nontender.  Normal bowel sounds heard. Central nervous system: Alert and oriented. No focal neurological deficits. Extremities: Symmetric 5 x 5 power. Skin: No rashes, lesions or ulcers Psychiatry: Judgement and insight appear normal. Mood & affect appropriate.     Data Reviewed: I have personally reviewed following labs and imaging studies  CBC: Recent Labs  Lab 10/12/20 2000 10/13/20 0229 10/14/20 0414 10/15/20 0423  WBC 6.7 9.8 5.2 7.2  NEUTROABS  --   --  3.4 5.6  HGB 14.5 14.0 13.1 12.8  HCT 43.2 42.3 39.5 38.1  MCV 89.8 90.4 90.6 90.5  PLT 441* 444* 455* 462*    Basic  Metabolic Panel: Recent Labs  Lab 10/12/20 2000 10/13/20 0229 10/14/20 0414 10/15/20 0423  NA 142  --  140 139  K 3.8  --  3.9 3.8  CL 105  --  103 105  CO2 25  --  26 27  GLUCOSE 115*  --  151* 163*  BUN 19  --  25* 24*  CREATININE 0.84 0.85 0.75 0.66  CALCIUM 8.7*  --  8.5* 8.4*    GFR: Estimated Creatinine Clearance: 86.4 mL/min (by C-G formula based on SCr of 0.66 mg/dL).  Liver Function Tests: Recent Labs  Lab 10/12/20 2000 10/14/20 0414 10/15/20 0423  AST 42* 62* 43*  ALT 44 66* 77*  ALKPHOS 60 52 52  BILITOT 0.7 0.8 0.9  PROT 7.2 6.2* 6.0*  ALBUMIN 3.4* 3.1* 3.0*    CBG: No results for input(s): GLUCAP in the last 168 hours.   Recent Results (from the past 240 hour(s))  Respiratory Panel by RT PCR (Flu A&B, Covid) - Nasopharyngeal Swab     Status: Abnormal   Collection Time: 10/12/20  8:00 PM   Specimen: Nasopharyngeal Swab  Result Value Ref Range Status   SARS Coronavirus 2 by RT PCR POSITIVE (A) NEGATIVE Final    Comment: RESULT CALLED TO, READ BACK BY AND VERIFIED WITH: LISA THOMPSON AT 2151 ON 10/12/20 BY SS (NOTE) SARS-CoV-2 target nucleic acids are DETECTED.  SARS-CoV-2 RNA is generally detectable in upper respiratory specimens  during the acute phase of infection. Positive results are indicative of the presence of the identified virus, but do not rule out bacterial infection or co-infection with other pathogens not detected by the test. Clinical correlation with patient history and other diagnostic information is necessary to determine patient infection status. The expected result is Negative.  Fact Sheet for Patients:  https://www.moore.com/  Fact Sheet for Healthcare Providers: https://www.young.biz/  This test is not yet approved or cleared by the [place] FDA and  has been authorized for detection and/or diagnosis of SARS-CoV-2 by FDA under an Emergency Use Authorization (EUA).  This EUA  will remain in effect (meaning this test ca n be used) for the duration of  the COVID-19 declaration under Section 564(b)(1) of the Act, 21 U.S.C. section 360bbb-3(b)(1), unless the authorization is terminated or revoked sooner.      Influenza A by PCR NEGATIVE NEGATIVE Final   Influenza B by PCR NEGATIVE NEGATIVE Final    Comment: (NOTE) The Xpert Xpress SARS-CoV-2/FLU/RSV assay is intended as an aid in  the diagnosis of influenza from Nasopharyngeal swab specimens and  should not be used as a sole basis for treatment. Nasal washings and  aspirates are unacceptable for Xpert Xpress SARS-CoV-2/FLU/RSV  testing.  Fact Sheet for Patients: https://www.moore.com/  Fact Sheet for Healthcare Providers: https://www.young.biz/  This test is not yet  approved or cleared by the [place] and  has been authorized for detection and/or diagnosis of SARS-CoV-2 by  FDA under an Emergency Use Authorization (EUA). This EUA will remain  in effect (meaning this test can be used) for the duration of the  Covid-19 declaration under Section 564(b)(1) of the Act, 21  U.S.C. section 360bbb-3(b)(1), unless the authorization is  terminated or revoked. Performed at [facility], [address] [place], [place] [address]          Radiology Studies: No results found.      Scheduled Meds: . albuterol  2 puff Inhalation Q6H  . vitamin C  500 mg Oral Daily  . atorvastatin  40 mg Oral QHS  . buPROPion  200 mg Oral BID  . cholecalciferol  5,000 Units Oral Daily  . enoxaparin (LOVENOX) injection  0.5 mg/kg Subcutaneous Q24H  . famotidine  20 mg Oral BID  . feeding supplement  237 mL Oral TID BM  . influenza vac split quadrivalent PF  0.5 mL Intramuscular Tomorrow-1000  . loratadine  10 mg Oral Daily  . methylPREDNISolone (SOLU-MEDROL) injection  60 mg Intravenous Q12H  . multivitamin with minerals  1 tablet Oral Daily  . pyridOXINE  50  mg Oral BID  . rOPINIRole  0.25 mg Oral QHS  . thyroid  60 mg Oral Daily  . cyanocobalamin  1,000 mcg Oral Daily  . zinc sulfate  220 mg Oral Daily   Continuous Infusions: . remdesivir 100 mg in NS 100 mL Stopped (10/14/20 0810)     LOS: 2 days   Time spent: Greater than 50% of this time was spent in counseling, explanation of diagnosis, planning of further management, and coordination of care.  I have personally reviewed and interpreted on  10/15/2020 daily labs,I reviewed all nursing notes, pharmacy notes, vitals, pertinent old records  I have discussed plan of care as described above with RN , patient on 10/15/2020  Voice Recognition /Dragon dictation system was used to create this note, attempts have been made to correct errors. Please contact the author with questions and/or clarifications.   Albertine Grates, MD PhD FACP [facility]  Available via Epic secure chat 7am-7pm for nonurgent issues Please page for urgent issues To page the attending provider between 7A-7P or the covering provider during after hours 7P-7A, please log into the web site [email or web address] and access using universal [place] password for that web site. If you do not have the password, please call the hospital operator.    10/15/2020, 8:17 AM

## 2020-10-16 LAB — CBC WITH DIFFERENTIAL/PLATELET
Abs Immature Granulocytes: 0.48 10*3/uL — ABNORMAL HIGH (ref 0.00–0.07)
Basophils Absolute: 0 10*3/uL (ref 0.0–0.1)
Basophils Relative: 0 %
Eosinophils Absolute: 0 10*3/uL (ref 0.0–0.5)
Eosinophils Relative: 0 %
HCT: 38.2 % (ref 36.0–46.0)
Hemoglobin: 13 g/dL (ref 12.0–15.0)
Immature Granulocytes: 4 %
Lymphocytes Relative: 11 %
Lymphs Abs: 1.2 10*3/uL (ref 0.7–4.0)
MCH: 30.7 pg (ref 26.0–34.0)
MCHC: 34 g/dL (ref 30.0–36.0)
MCV: 90.1 fL (ref 80.0–100.0)
Monocytes Absolute: 0.6 10*3/uL (ref 0.1–1.0)
Monocytes Relative: 6 %
Neutro Abs: 8.9 10*3/uL — ABNORMAL HIGH (ref 1.7–7.7)
Neutrophils Relative %: 79 %
Platelets: 491 10*3/uL — ABNORMAL HIGH (ref 150–400)
RBC: 4.24 MIL/uL (ref 3.87–5.11)
RDW: 13.5 % (ref 11.5–15.5)
WBC: 11.3 10*3/uL — ABNORMAL HIGH (ref 4.0–10.5)
nRBC: 0.2 % (ref 0.0–0.2)

## 2020-10-16 LAB — COMPREHENSIVE METABOLIC PANEL
ALT: 64 U/L — ABNORMAL HIGH (ref 0–44)
AST: 33 U/L (ref 15–41)
Albumin: 3 g/dL — ABNORMAL LOW (ref 3.5–5.0)
Alkaline Phosphatase: 50 U/L (ref 38–126)
Anion gap: 8 (ref 5–15)
BUN: 21 mg/dL — ABNORMAL HIGH (ref 6–20)
CO2: 28 mmol/L (ref 22–32)
Calcium: 8.3 mg/dL — ABNORMAL LOW (ref 8.9–10.3)
Chloride: 103 mmol/L (ref 98–111)
Creatinine, Ser: 0.69 mg/dL (ref 0.44–1.00)
GFR, Estimated: >60 mL/min (ref >60)
Glucose, Bld: 179 mg/dL — ABNORMAL HIGH (ref 70–99)
Potassium: 3.9 mmol/L (ref 3.5–5.1)
Sodium: 139 mmol/L (ref 135–145)
Total Bilirubin: 1.1 mg/dL (ref 0.3–1.2)
Total Protein: 5.8 g/dL — ABNORMAL LOW (ref 6.5–8.1)

## 2020-10-16 LAB — PROCALCITONIN: Procalcitonin: <0.1 ng/mL

## 2020-10-16 LAB — C-REACTIVE PROTEIN: CRP: 0.5 mg/dL (ref <1.0)

## 2020-10-16 LAB — FIBRIN DERIVATIVES D-DIMER (ARMC ONLY): Fibrin derivatives D-dimer (ARMC): 419.22 ng/mL (FEU) (ref 0.00–499.00)

## 2020-10-16 MED ORDER — METHYLPREDNISOLONE SODIUM SUCC 125 MG IJ SOLR
60.0000 mg | Freq: Every day | INTRAMUSCULAR | Status: DC
  Administered 2020-10-17: 60 mg via INTRAVENOUS
  Filled 2020-10-16: qty 2

## 2020-10-16 NOTE — Plan of Care (Signed)
  Problem: Respiratory: Goal: Will maintain a patent airway Outcome: Progressing Pt on 2L O2 and maintaining sats Goal: Complications related to the disease process, condition or treatment will be avoided or minimized Outcome: Progressing

## 2020-10-16 NOTE — Progress Notes (Signed)
PROGRESS NOTE    Brittany Cain  ZDG:[id number] DOB: 01-09-1961 DOA: 10/12/2020 PCP: Marisue Ivan, MD    Chief Complaint  Patient presents with  . Shortness of Breath  . Diarrhea    Brief Narrative:  Brittany Cain is a 59 y.o. female with medical history significant for hypertension, depression, chronic back pain, GERD, obesity class III, who presents to the emergency room with a 1 week history of diarrhea, fever of up to 103, body aches, now with shortness of breath that has been progressively worsening.  She is unvaccinated against Covid but denies exposure  Subjective:  She reports feeling better Reports mild intermittent productive cough, denies chest pain, no edema Tachypnea seems has resolved   Assessment & Plan:   Principal Problem:   Acute respiratory failure due to COVID-19 [facility]) Active Problems:   Essential hypertension   Pneumonia due to COVID-19 virus   Gastroenteritis due to COVID-19 virus   Obesity, Class III, BMI 40-49.9 (morbid obesity) (HCC)   Chronic back pain   Depression   Acute hypoxic respiratory failure due to COVID-19 infection, COVID-19 pneumonia -She has both respiratory symptoms and GI symptoms on presentation -O2 sat in the ER 88% on room air requiring 2 L to bring sats up to the mid 90s, increased work of breathing -Chest x-ray personally reviewed showed bilateral lower lung infiltrate consistent with multifocal pneumonia - continue remdesivir,  Decadron changed to Solu-Medrol, continue vitamin C and zinc supplement, Lovenox for DVT prophylaxis at 0.5 mg/kg every 24 hours - procalcitonin, CRP, ddimer has been stable, clinically she is improving, wean oxygen, will do home o2 eval, likely able to discharge home tomorrow after last dose of remdesivir and go home on prednisone taper, patient is looking forward to go home   Transaminitis Likely from current COVID-19 infection   hepatitis panel negative  LFT start to improve F/u with  pcp  Thrombocytosis likely reactive, monitor  Hypertension; home meds losartan was not restarted on admission, currently normotensive, will continue hold losartan, likely able to resume at discharge  Hyperlipidemia: Continue statin  Hypothyroidism: Continue supplement,  TSH suppressed, decrease Armour thyroid dose from 60 mg daily to 30 mg daily  Class III obesity Body mass index is 42.62 kg/m.    DVT prophylaxis: Lovenox   Code Status: Full Family Communication: Husband over the phone on 10/30 Disposition:   Status is: Inpatient  Dispo: The patient is from: Home              Anticipated d/c is to: Home              Anticipated d/c date is: Hopefully on Monday after finish 5 dose of remdesivir                Consultants:   None  Procedures:   None  Antimicrobials:   Antiviral remdesivir     Objective: Vitals:   10/16/20 0503 10/16/20 0838 10/16/20 1100 10/16/20 1150  BP: 139/89 (!) 144/94  137/68  Pulse: 71 84  81  Resp: 20 20  20   Temp: 98.6 F (37 C) 98.5 F (36.9 C)  98.1 F (36.7 C)  TempSrc: Oral Oral  Oral  SpO2: 94% 95% 94% 94%  Weight:      Height:        Intake/Output Summary (Last 24 hours) at 10/16/2020 1401 Last data filed at 10/16/2020 1047 Gross per 24 hour  Intake 337 ml  Output --  Net 337 ml  Filed Weights   10/12/20 1950  Weight: 105.7 kg    Examination:  General exam: calm, NAD Respiratory system: Diminished, no wheezing, no rales, no rhonchi.  Still have intermittent tachypnea but overall appear slightly improved Cardiovascular system: S1 & S2 heard, RRR. No pedal edema. Gastrointestinal system: Protuberant abdomen , soft and nontender.  Normal bowel sounds heard. Central nervous system: Alert and oriented. No focal neurological deficits. Extremities: Symmetric 5 x 5 power. Skin: No rashes, lesions or ulcers Psychiatry: Judgement and insight appear normal. Mood & affect appropriate.     Data Reviewed: I have  personally reviewed following labs and imaging studies  CBC: Recent Labs  Lab 10/12/20 2000 10/13/20 0229 10/14/20 0414 10/15/20 0423 10/16/20 0403  WBC 6.7 9.8 5.2 7.2 11.3*  NEUTROABS  --   --  3.4 5.6 8.9*  HGB 14.5 14.0 13.1 12.8 13.0  HCT 43.2 42.3 39.5 38.1 38.2  MCV 89.8 90.4 90.6 90.5 90.1  PLT 441* 444* 455* 462* 491*    Basic Metabolic Panel: Recent Labs  Lab 10/12/20 2000 10/13/20 0229 10/14/20 0414 10/15/20 0423 10/16/20 0403  NA 142  --  140 139 139  K 3.8  --  3.9 3.8 3.9  CL 105  --  103 105 103  CO2 25  --  26 27 28   GLUCOSE 115*  --  151* 163* 179*  BUN 19  --  25* 24* 21*  CREATININE 0.84 0.85 0.75 0.66 0.69  CALCIUM 8.7*  --  8.5* 8.4* 8.3*    GFR: Estimated Creatinine Clearance: 86.4 mL/min (by C-G formula based on SCr of 0.69 mg/dL).  Liver Function Tests: Recent Labs  Lab 10/12/20 2000 10/14/20 0414 10/15/20 0423 10/16/20 0403  AST 42* 62* 43* 33  ALT 44 66* 77* 64*  ALKPHOS 60 52 52 50  BILITOT 0.7 0.8 0.9 1.1  PROT 7.2 6.2* 6.0* 5.8*  ALBUMIN 3.4* 3.1* 3.0* 3.0*    CBG: No results for input(s): GLUCAP in the last 168 hours.   Recent Results (from the past 240 hour(s))  Respiratory Panel by RT PCR (Flu A&B, Covid) - Nasopharyngeal Swab     Status: Abnormal   Collection Time: 10/12/20  8:00 PM   Specimen: Nasopharyngeal Swab  Result Value Ref Range Status   SARS Coronavirus 2 by RT PCR POSITIVE (A) NEGATIVE Final    Comment: RESULT CALLED TO, READ BACK BY AND VERIFIED WITH: LISA THOMPSON AT 2151 ON 10/12/20 BY SS (NOTE) SARS-CoV-2 target nucleic acids are DETECTED.  SARS-CoV-2 RNA is generally detectable in upper respiratory specimens  during the acute phase of infection. Positive results are indicative of the presence of the identified virus, but do not rule out bacterial infection or co-infection with other pathogens not detected by the test. Clinical correlation with patient history and other diagnostic information is  necessary to determine patient infection status. The expected result is Negative.  Fact Sheet for Patients:  10/14/20  Fact Sheet for Healthcare Providers: https://www.moore.com/  This test is not yet approved or cleared by the https://www.young.biz/ FDA and  has been authorized for detection and/or diagnosis of SARS-CoV-2 by FDA under an Emergency Use Authorization (EUA).  This EUA will remain in effect (meaning this test ca n be used) for the duration of  the COVID-19 declaration under Section 564(b)(1) of the Act, 21 U.S.C. section 360bbb-3(b)(1), unless the authorization is terminated or revoked sooner.      Influenza A by PCR NEGATIVE NEGATIVE Final   Influenza  B by PCR NEGATIVE NEGATIVE Final    Comment: (NOTE) The Xpert Xpress SARS-CoV-2/FLU/RSV assay is intended as an aid in  the diagnosis of influenza from Nasopharyngeal swab specimens and  should not be used as a sole basis for treatment. Nasal washings and  aspirates are unacceptable for Xpert Xpress SARS-CoV-2/FLU/RSV  testing.  Fact Sheet for Patients: https://www.moore.com/  Fact Sheet for Healthcare Providers: https://www.young.biz/  This test is not yet approved or cleared by the [place] FDA and  has been authorized for detection and/or diagnosis of SARS-CoV-2 by  FDA under an Emergency Use Authorization (EUA). This EUA will remain  in effect (meaning this test can be used) for the duration of the  Covid-19 declaration under Section 564(b)(1) of the Act, 21  U.S.C. section 360bbb-3(b)(1), unless the authorization is  terminated or revoked. Performed at [facility], [address], [place], [place] [address]          Radiology Studies: No results found.      Scheduled Meds: . albuterol  2 puff Inhalation Q6H  . vitamin C  500 mg Oral Daily  . atorvastatin  40 mg Oral QHS  . buPROPion  200 mg  Oral BID  . cholecalciferol  5,000 Units Oral Daily  . enoxaparin (LOVENOX) injection  0.5 mg/kg Subcutaneous Q24H  . famotidine  20 mg Oral BID  . feeding supplement  237 mL Oral TID BM  . influenza vac split quadrivalent PF  0.5 mL Intramuscular Tomorrow-1000  . loratadine  10 mg Oral Daily  . [START ON 10/17/2020] methylPREDNISolone (SOLU-MEDROL) injection  60 mg Intravenous Daily  . multivitamin with minerals  1 tablet Oral Daily  . pyridOXINE  50 mg Oral BID  . rOPINIRole  0.25 mg Oral QHS  . thyroid  30 mg Oral Daily  . cyanocobalamin  1,000 mcg Oral Daily  . zinc sulfate  220 mg Oral Daily   Continuous Infusions: . remdesivir 100 mg in NS 100 mL Stopped (10/16/20 1042)     LOS: 3 days   Time spent: Greater than 50% of this time was spent in counseling, explanation of diagnosis, planning of further management, and coordination of care.  I have personally reviewed and interpreted on  10/16/2020 daily labs,I reviewed all nursing notes, pharmacy notes, vitals, pertinent old records  I have discussed plan of care as described above with RN , patient on 10/16/2020  Voice Recognition /Dragon dictation system was used to create this note, attempts have been made to correct errors. Please contact the author with questions and/or clarifications.   Albertine Grates, MD PhD FACP [facility]  Available via Epic secure chat 7am-7pm for nonurgent issues Please page for urgent issues To page the attending provider between 7A-7P or the covering provider during after hours 7P-7A, please log into the web site [email or web address] and access using universal [place] password for that web site. If you do not have the password, please call the hospital operator.    10/16/2020, 2:01 PM

## 2020-10-16 NOTE — Plan of Care (Signed)
  Problem: Respiratory: Goal: Will maintain a patent airway Outcome: Progressing   Problem: Activity: Goal: Risk for activity intolerance will decrease Outcome: Progressing   Problem: Pain Managment: Goal: General experience of comfort will improve Outcome: Progressing   Problem: Safety: Goal: Ability to remain free from injury will improve Outcome: Progressing

## 2020-10-17 LAB — CBC WITH DIFFERENTIAL/PLATELET
Abs Immature Granulocytes: 0.55 10*3/uL — ABNORMAL HIGH (ref 0.00–0.07)
Basophils Absolute: 0 10*3/uL (ref 0.0–0.1)
Basophils Relative: 0 %
Eosinophils Absolute: 0 10*3/uL (ref 0.0–0.5)
Eosinophils Relative: 0 %
HCT: 38.8 % (ref 36.0–46.0)
Hemoglobin: 12.9 g/dL (ref 12.0–15.0)
Immature Granulocytes: 4 %
Lymphocytes Relative: 15 %
Lymphs Abs: 2.1 10*3/uL (ref 0.7–4.0)
MCH: 30.6 pg (ref 26.0–34.0)
MCHC: 33.2 g/dL (ref 30.0–36.0)
MCV: 92.2 fL (ref 80.0–100.0)
Monocytes Absolute: 1.2 10*3/uL — ABNORMAL HIGH (ref 0.1–1.0)
Monocytes Relative: 9 %
Neutro Abs: 10.1 10*3/uL — ABNORMAL HIGH (ref 1.7–7.7)
Neutrophils Relative %: 72 %
Platelets: 521 10*3/uL — ABNORMAL HIGH (ref 150–400)
RBC: 4.21 MIL/uL (ref 3.87–5.11)
RDW: 13.8 % (ref 11.5–15.5)
WBC: 13.9 10*3/uL — ABNORMAL HIGH (ref 4.0–10.5)
nRBC: 0.5 % — ABNORMAL HIGH (ref 0.0–0.2)

## 2020-10-17 LAB — COMPREHENSIVE METABOLIC PANEL
ALT: 61 U/L — ABNORMAL HIGH (ref 0–44)
AST: 31 U/L (ref 15–41)
Albumin: 2.9 g/dL — ABNORMAL LOW (ref 3.5–5.0)
Alkaline Phosphatase: 48 U/L (ref 38–126)
Anion gap: 8 (ref 5–15)
BUN: 21 mg/dL — ABNORMAL HIGH (ref 6–20)
CO2: 29 mmol/L (ref 22–32)
Calcium: 8.4 mg/dL — ABNORMAL LOW (ref 8.9–10.3)
Chloride: 103 mmol/L (ref 98–111)
Creatinine, Ser: 0.74 mg/dL (ref 0.44–1.00)
GFR, Estimated: >60 mL/min (ref >60)
Glucose, Bld: 134 mg/dL — ABNORMAL HIGH (ref 70–99)
Potassium: 3.9 mmol/L (ref 3.5–5.1)
Sodium: 140 mmol/L (ref 135–145)
Total Bilirubin: 0.9 mg/dL (ref 0.3–1.2)
Total Protein: 5.5 g/dL — ABNORMAL LOW (ref 6.5–8.1)

## 2020-10-17 LAB — FIBRIN DERIVATIVES D-DIMER (ARMC ONLY): Fibrin derivatives D-dimer (ARMC): 417.63 ng/mL (FEU) (ref 0.00–499.00)

## 2020-10-17 LAB — C-REACTIVE PROTEIN: CRP: 0.7 mg/dL (ref <1.0)

## 2020-10-17 MED ORDER — PREDNISONE 20 MG PO TABS: ORAL_TABLET | ORAL | 0 refills | Status: DC

## 2020-10-17 MED ORDER — BENZONATATE 100 MG PO CAPS: 100.0000 mg | ORAL_CAPSULE | Freq: Three times a day (TID) | ORAL | 0 refills | Status: DC | PRN

## 2020-10-17 NOTE — Progress Notes (Addendum)
Pt d/c to home via husband. VSS. IV removed intact. Education complete. All questions answered. All belongings sent with pt.

## 2020-10-17 NOTE — Progress Notes (Signed)
SATURATION QUALIFICATIONS: (This note is used to comply with regulatory documentation for home oxygen)  Patient Saturations on Room Air at Rest = 91%  Patient Saturations on Room Air while Ambulating = 89%  Patient Saturations on 1 Liters of oxygen while Ambulating = 91%  Please briefly explain why patient needs home oxygen:

## 2020-10-17 NOTE — Discharge Summary (Signed)
[facility]  Physician Discharge Summary   Patient ID: Brittany Cain MRN: [id number] DOB/AGE: Feb 04, 1961 59 y.o.  Admit date: 10/12/2020 Discharge date: 10/17/2020  PCP: Marisue Ivan, MD  DISCHARGE DIAGNOSES:  Pneumonia due to COVID-19 Acute respiratory failure with hypoxia Transaminitis secondary to COVID-19 Thrombocytosis likely reactive Essential hypertension Hyperlipidemia Hypothyroidism Class III obesity  RECOMMENDATIONS FOR OUTPATIENT FOLLOW UP: 1. Outpatient follow-up with PCP 2. Home oxygen    Home Health: None Equipment/Devices: Home oxygen  CODE STATUS: Full code  DISCHARGE CONDITION: fair  Diet recommendation: Heart healthy  INITIAL HISTORY: Brittany Empson Crawfordis a 59 y.o.femalewith medical history significant forhypertension, depression, chronic back pain, GERD, obesity class III, who presented to the emergency room with a 1 week history of diarrhea, fever of up to 103, body aches, shortness of breath that has been progressively worsening.She is unvaccinated against Covid but denies exposure   HOSPITAL COURSE:   Pneumonia due to COVID-19/acute respiratory failure with hypoxia Patient had both respiratory and GI symptoms on presentation.  She was noted to be hypoxic in the emergency department.  She was placed on 2 L of oxygen.  Chest x-ray showed bilateral infiltrates.  She was considered high risk for decompensation due to her obesity.  Patient was started on Remdesivir and steroids.  She started improving.  Inflammatory markers were never elevated.  She did not require baricitinib.  Home oxygen assessment to be done today.  Medically otherwise she is stable.  She wants to go home.  Okay for discharge later today.  She will be discharged on tapering doses of prednisone.  Transaminitis Likely from current COVID-19 infection.  Seems to be improving.  Thrombocytosis Likely reactive.   Essential hypertension May resume home  medications.    Hyperlipidemia Continue statin  Hypothyroidism TSH was noted to be 0.155.  Free T4 was not checked.  Low TSH likely due to sick euthyroid.  Will not recommend changing the dose of her thyroid supplement.  She can continue the same dose as prior to admission.  Class III obesity Estimated body mass index is 42.62 kg/m as calculated from the following:   Height as of this encounter:  (1.575 m).   Weight as of this encounter: 105.7 kg.   Overall she is stable.  Feels better.  Wants to go home.  Okay for discharge today.  PERTINENT LABS:  The results of significant diagnostics from this hospitalization (including imaging, microbiology, ancillary and laboratory) are listed below for reference.    Microbiology: Recent Results (from the past 240 hour(s))  Respiratory Panel by RT PCR (Flu A&B, Covid) - Nasopharyngeal Swab     Status: Abnormal   Collection Time: 10/12/20  8:00 PM   Specimen: Nasopharyngeal Swab  Result Value Ref Range Status   SARS Coronavirus 2 by RT PCR POSITIVE (A) NEGATIVE Final    Comment: RESULT CALLED TO, READ BACK BY AND VERIFIED WITH: LISA THOMPSON AT 2151 ON 10/12/20 BY SS (NOTE) SARS-CoV-2 target nucleic acids are DETECTED.  SARS-CoV-2 RNA is generally detectable in upper respiratory specimens  during the acute phase of infection. Positive results are indicative of the presence of the identified virus, but do not rule out bacterial infection or co-infection with other pathogens not detected by the test. Clinical correlation with patient history and other diagnostic information is necessary to determine patient infection status. The expected result is Negative.  Fact Sheet for Patients:  https://www.moore.com/  Fact Sheet for Healthcare Providers: https://www.young.biz/  This test is not yet approved  or cleared by the [place] and  has been authorized for detection and/or diagnosis  of SARS-CoV-2 by FDA under an Emergency Use Authorization (EUA).  This EUA will remain in effect (meaning this test ca n be used) for the duration of  the COVID-19 declaration under Section 564(b)(1) of the Act, 21 U.S.C. section 360bbb-3(b)(1), unless the authorization is terminated or revoked sooner.      Influenza A by PCR NEGATIVE NEGATIVE Final   Influenza B by PCR NEGATIVE NEGATIVE Final    Comment: (NOTE) The Xpert Xpress SARS-CoV-2/FLU/RSV assay is intended as an aid in  the diagnosis of influenza from Nasopharyngeal swab specimens and  should not be used as a sole basis for treatment. Nasal washings and  aspirates are unacceptable for Xpert Xpress SARS-CoV-2/FLU/RSV  testing.  Fact Sheet for Patients: https://www.moore.com/  Fact Sheet for Healthcare Providers: https://www.young.biz/  This test is not yet approved or cleared by the [place] FDA and  has been authorized for detection and/or diagnosis of SARS-CoV-2 by  FDA under an Emergency Use Authorization (EUA). This EUA will remain  in effect (meaning this test can be used) for the duration of the  Covid-19 declaration under Section 564(b)(1) of the Act, 21  U.S.C. section 360bbb-3(b)(1), unless the authorization is  terminated or revoked. Performed at [facility], [address] [place], [place] [address]      Labs:  COVID-19 Labs   Lab Results  Component Value Date   SARSCOV2NAA POSITIVE (A) 10/12/2020   SARSCOV2NAA NEGATIVE 10/31/2019   SARSCOV2NAA NOT DETECTED 05/28/2019      Basic Metabolic Panel: Recent Labs  Lab 10/12/20 2000 10/12/20 2000 10/13/20 0229 10/14/20 0414 10/15/20 0423 10/16/20 0403 10/17/20 0414  NA 142  --   --  140 139 139 140  K 3.8  --   --  3.9 3.8 3.9 3.9  CL 105  --   --  103 105 103 103  CO2 25  --   --  26 27 28 29   GLUCOSE 115*  --   --  151* 163* 179* 134*  BUN 19  --   --  25* 24* 21* 21*  CREATININE  0.84   < > 0.85 0.75 0.66 0.69 0.74  CALCIUM 8.7*  --   --  8.5* 8.4* 8.3* 8.4*   < > = values in this interval not displayed.   Liver Function Tests: Recent Labs  Lab 10/12/20 2000 10/14/20 0414 10/15/20 0423 10/16/20 0403 10/17/20 0414  AST 42* 62* 43* 33 31  ALT 44 66* 77* 64* 61*  ALKPHOS 60 52 52 50 48  BILITOT 0.7 0.8 0.9 1.1 0.9  PROT 7.2 6.2* 6.0* 5.8* 5.5*  ALBUMIN 3.4* 3.1* 3.0* 3.0* 2.9*   CBC: Recent Labs  Lab 10/13/20 0229 10/14/20 0414 10/15/20 0423 10/16/20 0403 10/17/20 0414  WBC 9.8 5.2 7.2 11.3* 13.9*  NEUTROABS  --  3.4 5.6 8.9* 10.1*  HGB 14.0 13.1 12.8 13.0 12.9  HCT 42.3 39.5 38.1 38.2 38.8  MCV 90.4 90.6 90.5 90.1 92.2  PLT 444* 455* 462* 491* 521*    IMAGING STUDIES DG Chest 2 View  Result Date: 10/12/2020 CLINICAL DATA:  Shortness of breath EXAM: CHEST - 2 VIEW COMPARISON:  None. FINDINGS: The heart size and mediastinal contours are within normal limits. Patchy airspace opacities are seen at the periphery of both lower lungs. The visualized skeletal structures are unremarkable. IMPRESSION: Patchy airspace opacities at the periphery of both lower  lungs, likely consistent with multifocal pneumonia. Electronically Signed   By: Jonna Clark M.D.   On: 10/12/2020 20:22    DISCHARGE EXAMINATION: Vitals:   10/17/20 0038 10/17/20 0600 10/17/20 0823 10/17/20 1128  BP: 140/80 130/75 (!) 109/91 125/73  Pulse: 83 77 86 86  Resp: 18 17 20 20   Temp: 97.7 F (36.5 C) 97.6 F (36.4 C) 98.2 F (36.8 C) 98.2 F (36.8 C)  TempSrc:  Oral Oral Oral  SpO2: (!) 89% 94% 96% (!) 89%  Weight:      Height:       General appearance: Awake alert.  In no distress Resp: Normal effort at rest.  Few crackles bilateral bases.  No wheezing or rhonchi. Cardio: S1-S2 is normal regular.  No S3-S4.  No rubs murmurs or bruit GI: Abdomen is soft.  Nontender nondistended.  Bowel sounds are present normal.  No masses organomegaly   DISPOSITION: Home  Discharge  Instructions    Call MD for:  difficulty breathing, headache or visual disturbances   Complete by: As directed    Call MD for:  extreme fatigue   Complete by: As directed    Call MD for:  persistant dizziness or light-headedness   Complete by: As directed    Call MD for:  persistant nausea and vomiting   Complete by: As directed    Call MD for:  severe uncontrolled pain   Complete by: As directed    Call MD for:  temperature >100.4   Complete by: As directed    Diet - low sodium heart healthy   Complete by: As directed    Discharge instructions   Complete by: As directed    You will need to remain isolated till November 16.  Take your medications as prescribed.  Please follow-up with your PCP in 1 to 2 weeks.  COVID 19 INSTRUCTIONS  - You are felt to be stable enough to no longer require inpatient monitoring, testing, and treatment, though you will need to follow the recommendations below: - Based on the CDC's non-test criteria for ending self-isolation: You may not return to work/leave the home until at least 20 days since symptom onset AND 24 hours without a fever (without taking tylenol, ibuprofen, etc.) AND have improvement in respiratory symptoms.  - Follow up with your doctor in the next week via telehealth or seek medical attention right away if your symptoms get WORSE.    Directions for you at home:  Wear a facemask You should wear a facemask that covers your nose and mouth when you are in the same room with other people and when you visit a healthcare provider. People who live with or visit you should also wear a facemask while they are in the same room with you.  Separate yourself from other people in your home As much as possible, you should stay in a different room from other people in your home. Also, you should use a separate bathroom, if available.  Avoid sharing household items You should not share dishes, drinking glasses, cups, eating utensils, towels,  bedding, or other items with other people in your home. After using these items, you should wash them thoroughly with soap and water.  Cover your coughs and sneezes Cover your mouth and nose with a tissue when you cough or sneeze, or you can cough or sneeze into your sleeve. Throw used tissues in a lined trash can, and immediately wash your hands with soap and water for at least 20  seconds or use an alcohol-based hand rub.  Wash your [employer] your hands often and thoroughly with soap and water for at least 20 seconds. You can use an alcohol-based hand sanitizer if soap and water are not available and if your hands are not visibly dirty. Avoid touching your eyes, nose, and mouth with unwashed hands.  Directions for those who live with, or provide care at home for you:  Limit the number of people who have contact with the patient If possible, have only one caregiver for the patient. Other household members should stay in another home or place of residence. If this is not possible, they should stay in another room, or be separated from the patient as much as possible. Use a separate bathroom, if available. Restrict visitors who do not have an essential need to be in the home.  Ensure good ventilation Make sure that shared spaces in the home have good air flow, such as from an air conditioner or an opened window, weather permitting.  Wash your hands often Wash your hands often and thoroughly with soap and water for at least 20 seconds. You can use an alcohol based hand sanitizer if soap and water are not available and if your hands are not visibly dirty. Avoid touching your eyes, nose, and mouth with unwashed hands. Use disposable paper towels to dry your hands. If not available, use dedicated cloth towels and replace them when they become wet.  Wear a facemask and gloves Wear a disposable facemask at all times in the room and gloves when you touch or have contact with the patient's  blood, body fluids, and/or secretions or excretions, such as sweat, saliva, sputum, nasal mucus, vomit, urine, or feces.  Ensure the mask fits over your nose and mouth tightly, and do not touch it during use. Throw out disposable facemasks and gloves after using them. Do not reuse. Wash your hands immediately after removing your facemask and gloves. If your personal clothing becomes contaminated, carefully remove clothing and launder. Wash your hands after handling contaminated clothing. Place all used disposable facemasks, gloves, and other waste in a lined container before disposing them with other household waste. Remove gloves and wash your hands immediately after handling these items.  Do not share dishes, glasses, or other household items with the patient Avoid sharing household items. You should not share dishes, drinking glasses, cups, eating utensils, towels, bedding, or other items with a patient who is confirmed to have, or being evaluated for, COVID-19 infection. After the person uses these items, you should wash them thoroughly with soap and water.  Wash laundry thoroughly Immediately remove and wash clothes or bedding that have blood, body fluids, and/or secretions or excretions, such as sweat, saliva, sputum, nasal mucus, vomit, urine, or feces, on them. Wear gloves when handling laundry from the patient. Read and follow directions on labels of laundry or clothing items and detergent. In general, wash and dry with the warmest temperatures recommended on the label.  Clean all areas the individual has used often Clean all touchable surfaces, such as counters, tabletops, doorknobs, bathroom fixtures, toilets, phones, keyboards, tablets, and bedside tables, every day. Also, clean any surfaces that may have blood, body fluids, and/or secretions or excretions on them. Wear gloves when cleaning surfaces the patient has come in contact with. Use a diluted bleach solution (e.g., dilute  bleach with 1 part bleach and 10 parts water) or a household disinfectant with a label that says EPA-registered for coronaviruses. To make  a bleach solution at home, add 1 tablespoon of bleach to 1 quart (4 cups) of water. For a larger supply, add  cup of bleach to 1 gallon (16 cups) of water. Read labels of cleaning products and follow recommendations provided on product labels. Labels contain instructions for safe and effective use of the cleaning product including precautions you should take when applying the product, such as wearing gloves or eye protection and making sure you have good ventilation during use of the product. Remove gloves and wash hands immediately after cleaning.  Monitor yourself for signs and symptoms of illness Caregivers and household members are considered close contacts, should monitor their health, and will be asked to limit movement outside of the home to the extent possible. Follow the monitoring steps for close contacts listed on the symptom monitoring form.   If you have additional questions, contact your local health department or call the epidemiologist on call at [phone] (available 24/7). This guidance is subject to change. For the most up-to-date guidance from [facility], please refer to their website: [email or web address]   You were cared for by a hospitalist during your hospital stay. If you have any questions about your discharge medications or the care you received while you were in the hospital after you are discharged, you can call the unit and asked to speak with the hospitalist on call if the hospitalist that took care of you is not available. Once you are discharged, your primary care physician will handle any further medical issues. Please note that NO REFILLS for any discharge medications will be authorized once you are discharged, as it is imperative that you return to your primary care physician (or  establish a relationship with a primary care physician if you do not have one) for your aftercare needs so that they can reassess your need for medications and monitor your lab values. If you do not have a primary care physician, you can call [phone] for a physician referral.   Increase activity slowly   Complete by: As directed         Allergies as of 10/17/2020      Reactions   Aciphex [rabeprazole] Swelling   Bactrim [sulfamethoxazole-trimethoprim] Diarrhea, Nausea Only   Ceftin [cefuroxime Axetil] Hives   Protonix [pantoprazole] Hives, Swelling   Vilazodone Hcl Hives, Other (See Comments)   Tongue swelling      Medication List    TAKE these medications   acetaminophen 325 MG tablet Commonly known as: TYLENOL Take 325-650 mg by mouth every 6 (six) hours as needed for mild pain or fever.   albuterol 108 (90 Base) MCG/ACT inhaler Commonly known as: VENTOLIN HFA Inhale 1-2 puffs into the lungs every 6 (six) hours as needed for wheezing or shortness of breath.   [employer] Thyroid 60 MG tablet Generic drug: thyroid Take 60 mg by mouth daily.   atorvastatin 40 MG tablet Commonly known as: LIPITOR Take 40 mg by mouth at bedtime.   benzonatate 100 MG capsule Commonly known as: Tessalon Perles Take 1 capsule (100 mg total) by mouth 3 (three) times daily as needed for cough.   buPROPion 200 MG 12 hr tablet Commonly known as: WELLBUTRIN SR Take 200 mg by mouth 2 (two) times daily.   cyanocobalamin 1000 MCG tablet Take 1,000 mcg by mouth daily.   famotidine 20 MG tablet Commonly known as: PEPCID Take 20 mg by mouth 2 (two) times daily.   loratadine 10 MG tablet Commonly known as: CLARITIN Take 10 mg by  mouth daily.   losartan 100 MG tablet Commonly known as: COZAAR Take 100 mg by mouth daily.   omeprazole 20 MG capsule Commonly known as: PRILOSEC Take 20 mg by mouth daily.   predniSONE 20 MG tablet Commonly known as: DELTASONE Take 3 tablets once daily for 3 days  followed by 2 tablets once daily for 3 days followed by 1 tablet once daily for 3 days and then stop   progesterone 200 MG capsule Commonly known as: PROMETRIUM Take 200 mg by mouth at bedtime.   pyridOXINE 50 MG tablet Commonly known as: VITAMIN B-6 Take 50 mg by mouth 2 (two) times daily.   rOPINIRole 0.25 MG tablet Commonly known as: REQUIP Take 0.25 mg by mouth at bedtime.   Vitamin D3 125 MCG (5000 UT) Caps Take 5,000 Units by mouth daily.            Durable Medical Equipment  (From admission, onward)         Start     Ordered   10/17/20 1330  For home use only DME oxygen  Once       Question Answer Comment  Length of Need 6 Months   Mode or (Route) Nasal cannula   Liters per Minute 1   Frequency Continuous (stationary and portable oxygen unit needed)   Oxygen conserving device Yes   Oxygen delivery system Gas      10/17/20 1329            Follow-up Information    Marisue IvanLinthavong, Kanhka, MD. Schedule an appointment as soon as possible for a visit in 1 week(s).   Specialty: Family Medicine Contact information: [address] [facility] [place] [id number] [phone]               TOTAL DISCHARGE TIME: 35 minutes  Zaylin Pistilli Rito EhrlichKrishnan  [facility] Pager on [email or web address]  10/17/2020, 4:42 PM

## 2020-10-17 NOTE — Progress Notes (Signed)
SATURATION QUALIFICATIONS: (This note is used to comply with regulatory documentation for home oxygen)  Patient Saturations on Room Air at Rest = 91%  Patient Saturations on Room Air while Ambulating = 87%  Patient Saturations on 1 Liters of oxygen while Ambulating = 92%  Please briefly explain why patient needs home oxygen:

## 2020-10-17 NOTE — TOC Transition Note (Signed)
Transition of Care [facility]) - CM/SW Discharge Note   Patient Details  Name: Brittany Cain MRN: [id number] Date of Birth: 02/08/1961  [facility] CM/SW Contact:  Allayne Butcher, RN Phone Number: 10/17/2020, 2:21 PM   Clinical Narrative:    Patient is medically cleared for discharge home today with home O2.  [employer] given referral for home O2 at 1L.  Oxygen to be delivered to the room.  RNCM provided patient with pulse oximeter for home use.  Husband will be picking patient up this afternoon.    Final next level of care: Home/Self Care Barriers to Discharge: No Barriers Identified   Patient Goals and CMS Choice        Discharge Placement                       Discharge Plan and Services                DME Arranged: Oxygen, Pulse oximeter DME Agency: [employer] Date DME Agency Contacted: 10/17/20 Time DME Agency Contacted: 1421 Representative spoke with at DME Agency: Oletha Cruel            Social Determinants of Health (SDOH) Interventions     Readmission Risk Interventions No flowsheet data found.

## 2020-10-17 NOTE — Discharge Instructions (Signed)
COVID-19 COVID-19 is a respiratory infection that is caused by a virus called severe acute respiratory syndrome coronavirus 2 (SARS-CoV-2). The disease is also known as coronavirus disease or novel coronavirus. In some people, the virus may not cause any symptoms. In others, it may cause a serious infection. The infection can get worse quickly and can lead to complications, such as:  Pneumonia, or infection of the lungs.  Acute respiratory distress syndrome or ARDS. This is a condition in which fluid build-up in the lungs prevents the lungs from filling with air and passing oxygen into the blood.  Acute respiratory failure. This is a condition in which there is not enough oxygen passing from the lungs to the body or when carbon dioxide is not passing from the lungs out of the body.  Sepsis or septic shock. This is a serious bodily reaction to an infection.  Blood clotting problems.  Secondary infections due to bacteria or fungus.  Organ failure. This is when your body's organs stop working. The virus that causes COVID-19 is contagious. This means that it can spread from person to person through droplets from coughs and sneezes (respiratory secretions). What are the causes? This illness is caused by a virus. You may catch the virus by:  Breathing in droplets from an infected person. Droplets can be spread by a person breathing, speaking, singing, coughing, or sneezing.  Touching something, like a table or a doorknob, that was exposed to the virus (contaminated) and then touching your mouth, nose, or eyes. What increases the risk? Risk for infection You are more likely to be infected with this virus if you:  Are within 6 feet (2 meters) of a person with COVID-19.  Provide care for or live with a person who is infected with COVID-19.  Spend time in crowded indoor spaces or live in shared housing. Risk for serious illness You are more likely to become seriously ill from the virus if you:   Are 50 years of age or older. The higher your age, the more you are at risk for serious illness.  Live in a nursing home or long-term care facility.  Have cancer.  Have a long-term (chronic) disease such as: ? Chronic lung disease, including chronic obstructive pulmonary disease or asthma. ? A long-term disease that lowers your body's ability to fight infection (immunocompromised). ? Heart disease, including heart failure, a condition in which the arteries that lead to the heart become narrow or blocked (coronary artery disease), a disease which makes the heart muscle thick, weak, or stiff (cardiomyopathy). ? Diabetes. ? Chronic kidney disease. ? Sickle cell disease, a condition in which red blood cells have an abnormal "sickle" shape. ? Liver disease.  Are obese. What are the signs or symptoms? Symptoms of this condition can range from mild to severe. Symptoms may appear any time from 2 to 14 days after being exposed to the virus. They include:  A fever or chills.  A cough.  Difficulty breathing.  Headaches, body aches, or muscle aches.  Runny or stuffy (congested) nose.  A sore throat.  New loss of taste or smell. Some people may also have stomach problems, such as nausea, vomiting, or diarrhea. Other people may not have any symptoms of COVID-19. How is this diagnosed? This condition may be diagnosed based on:  Your signs and symptoms, especially if: ? You live in an area with a COVID-19 outbreak. ? You recently traveled to or from an area where the virus is common. ? You   provide care for or live with a person who was diagnosed with COVID-19. ? You were exposed to a person who was diagnosed with COVID-19.  A physical exam.  Lab tests, which may include: ? Taking a sample of fluid from the back of your nose and throat (nasopharyngeal fluid), your nose, or your throat using a swab. ? A sample of mucus from your lungs (sputum). ? Blood tests.  Imaging tests, which  may include, X-rays, CT scan, or ultrasound. How is this treated? At present, there is no medicine to treat COVID-19. Medicines that treat other diseases are being used on a trial basis to see if they are effective against COVID-19. Your health care provider will talk with you about ways to treat your symptoms. For most people, the infection is mild and can be managed at home with rest, fluids, and over-the-counter medicines. Treatment for a serious infection usually takes places in a hospital intensive care unit (ICU). It may include one or more of the following treatments. These treatments are given until your symptoms improve.  Receiving fluids and medicines through an IV.  Supplemental oxygen. Extra oxygen is given through a tube in the nose, a face mask, or a hood.  Positioning you to lie on your stomach (prone position). This makes it easier for oxygen to get into the lungs.  Continuous positive airway pressure (CPAP) or bi-level positive airway pressure (BPAP) machine. This treatment uses mild air pressure to keep the airways open. A tube that is connected to a motor delivers oxygen to the body.  Ventilator. This treatment moves air into and out of the lungs by using a tube that is placed in your windpipe.  Tracheostomy. This is a procedure to create a hole in the neck so that a breathing tube can be inserted.  Extracorporeal membrane oxygenation (ECMO). This procedure gives the lungs a chance to recover by taking over the functions of the heart and lungs. It supplies oxygen to the body and removes carbon dioxide. Follow these instructions at home: Lifestyle  If you are sick, stay home except to get medical care. Your health care provider will tell you how long to stay home. Call your health care provider before you go for medical care.  Rest at home as told by your health care provider.  Do not use any products that contain nicotine or tobacco, such as cigarettes, e-cigarettes, and  chewing tobacco. If you need help quitting, ask your health care provider.  Return to your normal activities as told by your health care provider. Ask your health care provider what activities are safe for you. General instructions  Take over-the-counter and prescription medicines only as told by your health care provider.  Drink enough fluid to keep your urine pale yellow.  Keep all follow-up visits as told by your health care provider. This is important. How is this prevented?  There is no vaccine to help prevent COVID-19 infection. However, there are steps you can take to protect yourself and others from this virus. To protect yourself:   Do not travel to areas where COVID-19 is a risk. The areas where COVID-19 is reported change often. To identify high-risk areas and travel restrictions, check the CDC travel website: wwwnc.cdc.gov/travel/notices  If you live in, or must travel to, an area where COVID-19 is a risk, take precautions to avoid infection. ? Stay away from people who are sick. ? Wash your hands often with soap and water for 20 seconds. If soap and water   are not available, use an alcohol-based hand sanitizer. ? Avoid touching your mouth, face, eyes, or nose. ? Avoid going out in public, follow guidance from your state and local health authorities. ? If you must go out in public, wear a cloth face covering or face mask. Make sure your mask covers your nose and mouth. ? Avoid crowded indoor spaces. Stay at least 6 feet (2 meters) away from others. ? Disinfect objects and surfaces that are frequently touched every day. This may include:  Counters and tables.  Doorknobs and light switches.  Sinks and faucets.  Electronics, such as phones, remote controls, keyboards, computers, and tablets. To protect others: If you have symptoms of COVID-19, take steps to prevent the virus from spreading to others.  If you think you have a COVID-19 infection, contact your health care  provider right away. Tell your health care team that you think you may have a COVID-19 infection.  Stay home. Leave your house only to seek medical care. Do not use public transport.  Do not travel while you are sick.  Wash your hands often with soap and water for 20 seconds. If soap and water are not available, use alcohol-based hand sanitizer.  Stay away from other members of your household. Let healthy household members care for children and pets, if possible. If you have to care for children or pets, wash your hands often and wear a mask. If possible, stay in your own room, separate from others. Use a different bathroom.  Make sure that all people in your household wash their hands well and often.  Cough or sneeze into a tissue or your sleeve or elbow. Do not cough or sneeze into your hand or into the air.  Wear a cloth face covering or face mask. Make sure your mask covers your nose and mouth. Where to find more information  Centers for Disease Control and Prevention: www.cdc.gov/coronavirus/2019-ncov/index.html  World Health Organization: www.who.int/health-topics/coronavirus Contact a health care provider if:  You live in or have traveled to an area where COVID-19 is a risk and you have symptoms of the infection.  You have had contact with someone who has COVID-19 and you have symptoms of the infection. Get help right away if:  You have trouble breathing.  You have pain or pressure in your chest.  You have confusion.  You have bluish lips and fingernails.  You have difficulty waking from sleep.  You have symptoms that get worse. These symptoms may represent a serious problem that is an emergency. Do not wait to see if the symptoms will go away. Get medical help right away. Call your local emergency services (911 in the [place] Do not drive yourself to the hospital. Let the emergency medical personnel know if you think you have COVID-19. Summary  COVID-19 is a  respiratory infection that is caused by a virus. It is also known as coronavirus disease or novel coronavirus. It can cause serious infections, such as pneumonia, acute respiratory distress syndrome, acute respiratory failure, or sepsis.  The virus that causes COVID-19 is contagious. This means that it can spread from person to person through droplets from breathing, speaking, singing, coughing, or sneezing.  You are more likely to develop a serious illness if you are 50 years of age or older, have a weak immune system, live in a nursing home, or have chronic disease.  There is no medicine to treat COVID-19. Your health care provider will talk with you about ways to treat your symptoms.    Take steps to protect yourself and others from infection. Wash your hands often and disinfect objects and surfaces that are frequently touched every day. Stay away from people who are sick and wear a mask if you are sick. This information is not intended to replace advice given to you by your health care provider. Make sure you discuss any questions you have with your health care provider. Document Revised: 10/02/2019 Document Reviewed: 01/08/2019 Elsevier Patient Education  2020 [employer].  

## 2020-11-07 ENCOUNTER — Other Ambulatory Visit: Payer: Self-pay

## 2020-11-07 ENCOUNTER — Ambulatory Visit
Admission: RE | Admit: 2020-11-07 | Discharge: 2020-11-07 | Disposition: A | Discharge status: Home / Self Care | Payer: BC Managed Care – PPO | Source: Ambulatory Visit | Attending: Family Medicine | Admitting: Family Medicine

## 2020-11-07 DIAGNOSIS — Z1231 Encounter for screening mammogram for malignant neoplasm of breast: Secondary | ICD-10-CM | POA: Diagnosis not present

## 2020-12-22 ENCOUNTER — Ambulatory Visit: Payer: BC Managed Care – PPO | Admitting: Internal Medicine

## 2021-05-23 ENCOUNTER — Other Ambulatory Visit: Payer: Self-pay | Admitting: Urology

## 2021-05-23 ENCOUNTER — Other Ambulatory Visit: Payer: Self-pay | Admitting: Orthopedic Surgery

## 2021-05-23 DIAGNOSIS — R109 Unspecified abdominal pain: Secondary | ICD-10-CM

## 2021-06-05 ENCOUNTER — Ambulatory Visit: Admission: RE | Admit: 2021-06-05 | Payer: BC Managed Care – PPO | Source: Ambulatory Visit

## 2021-06-06 ENCOUNTER — Ambulatory Visit
Admission: RE | Admit: 2021-06-06 | Discharge: 2021-06-06 | Disposition: A | Discharge status: Home / Self Care | Payer: BC Managed Care – PPO | Source: Ambulatory Visit | Attending: Urology | Admitting: Urology

## 2021-06-06 ENCOUNTER — Other Ambulatory Visit: Payer: Self-pay

## 2021-06-06 DIAGNOSIS — R109 Unspecified abdominal pain: Secondary | ICD-10-CM | POA: Insufficient documentation

## 2021-06-06 LAB — POCT I-STAT CREATININE: Creatinine, Ser: 0.8 mg/dL (ref 0.44–1.00)

## 2021-06-06 MED ORDER — IOHEXOL 300 MG/ML  SOLN
100.0000 mL | Freq: Once | INTRAMUSCULAR | Status: AC | PRN
  Administered 2021-06-06: 100 mL via INTRAVENOUS

## 2021-09-21 ENCOUNTER — Encounter: Payer: Self-pay | Admitting: Emergency Medicine

## 2021-09-21 ENCOUNTER — Other Ambulatory Visit: Payer: Self-pay

## 2021-09-21 ENCOUNTER — Ambulatory Visit
Admission: EM | Admit: 2021-09-21 | Discharge: 2021-09-21 | Disposition: A | Discharge status: Home / Self Care | Payer: BC Managed Care – PPO | Attending: Internal Medicine | Admitting: Internal Medicine

## 2021-09-21 DIAGNOSIS — U071 COVID-19: Secondary | ICD-10-CM | POA: Diagnosis not present

## 2021-09-21 LAB — INFLUENZA A AND B ANTIGEN (CONVERTED LAB)
INFLUENZA A ANTIGEN, POC: NEGATIVE
INFLUENZA B ANTIGEN, POC: NEGATIVE

## 2021-09-21 LAB — POC SARS CORONAVIRUS 2 AG: SARSCOV2ONAVIRUS 2 AG: POSITIVE — AB

## 2021-09-21 MED ORDER — BENZONATATE 200 MG PO CAPS: 200.0000 mg | ORAL_CAPSULE | Freq: Three times a day (TID) | ORAL | 0 refills | Status: DC | PRN

## 2021-09-21 MED ORDER — MOLNUPIRAVIR EUA 200MG CAPSULE: 4.0000 | ORAL_CAPSULE | Freq: Two times a day (BID) | ORAL | 0 refills | Status: AC

## 2021-09-21 NOTE — ED Provider Notes (Signed)
[facility]    CSN: [id number] Arrival date & time: 09/21/21  1624      History   Chief Complaint Chief Complaint  Patient presents with   Cough   Fever    HPI Brittany Cain is a 60 y.o. female who presents with stuffy nose 2 days ago, then yesterday had a fever up to 101 and been fatigue x 3 days. She is unable to smell or taste. She has a cough which is productive occasional cough with green mucous. She and her sister were at the [facility] this past weekend and they got sick one day apart. Her husband also had a fever for one day with similar symptom She has HA and body aches. Has covid 09/2020 and was hospitalized.  Has not had covid shots.     Past Medical History:  Diagnosis Date   Anxiety    Chicken pox    Chronic back pain    Depression    GERD (gastroesophageal reflux disease)    History of kidney stones    Hypertension    Obesity    Restless leg syndrome     Patient Active Problem List   Diagnosis Date Noted   Pneumonia due to COVID-19 virus 10/13/2020   Acute respiratory failure due to COVID-19 (HCC) 10/13/2020   Gastroenteritis due to COVID-19 virus 10/13/2020   Obesity, Class III, BMI 40-49.9 (morbid obesity) (HCC) 10/13/2020   Chronic back pain 10/13/2020   Depression 10/13/2020   Sepsis (HCC) 10/31/2019   Ureterolithiasis 10/31/2019   Essential hypertension 10/31/2019   Hyperglycemia 10/31/2019   Restless leg syndrome    Obesity    Major depressive disorder, recurrent episode, severe (HCC) 04/09/2014   MDD (major depressive disorder), recurrent episode, severe (HCC) 04/08/2014   GAD (generalized anxiety disorder) 03/25/2014    Past Surgical History:  Procedure Laterality Date   ABDOMINAL HYSTERECTOMY     BACK SURGERY     lower back; metal in lower back   CHOLECYSTECTOMY     CYSTOSCOPY/URETEROSCOPY/HOLMIUM LASER/STENT PLACEMENT Left 10/31/2019   Procedure: ,left stent placement,left retrograde pyelogram,cystoscopy;  Surgeon:  Orson Ape, MD;  Location: [facility];  Service: Urology;  Laterality: Left;   ESOPHAGOGASTRODUODENOSCOPY (EGD) WITH PROPOFOL N/A 05/24/2017   Procedure: ESOPHAGOGASTRODUODENOSCOPY (EGD) WITH PROPOFOL;  Surgeon: Christena Deem, MD;  Location: [facility];  Service: Endoscopy;  Laterality: N/A;   EXTRACORPOREAL SHOCK WAVE LITHOTRIPSY Left 10/29/2019   Procedure: EXTRACORPOREAL SHOCK WAVE LITHOTRIPSY (ESWL);  Surgeon: Orson Ape, MD;  Location: [facility];  Service: Urology;  Laterality: Left;   INCISIONAL HERNIA REPAIR N/A 06/01/2019   Procedure: LAPAROSCOPIC INCISIONAL HERNIA REPAIR WITH MESH;  Surgeon: Carolan Shiver, MD;  Location: [facility] OR[facility];  Service: General;  Laterality: N/A;    OB History   No obstetric history on file.      Home Medications    Prior to Admission medications   Medication Sig Start Date End Date Taking? Authorizing Provider  acetaminophen (TYLENOL) 325 MG tablet Take 325-650 mg by mouth every 6 (six) hours as needed for mild pain or fever.   Yes [provider]  ARMOUR THYROID 60 MG tablet Take 60 mg by mouth daily. 10/04/20  Yes [provider]  atorvastatin (LIPITOR) 40 MG tablet Take 40 mg by mouth at bedtime.    Yes [provider]  benzonatate (TESSALON) 200 MG capsule Take 1 capsule (200 mg total) by mouth 3 (three) times daily as needed for cough. 09/21/21  Yes Rodriguez-Southworth,  Nettie Elm, PA-C  buPROPion (WELLBUTRIN SR) 200 MG 12 hr tablet Take 200 mg by mouth 2 (two) times daily.   Yes [provider]  Cholecalciferol (VITAMIN D3) 125 MCG (5000 UT) CAPS Take 5,000 Units by mouth daily.   Yes [provider]  cyanocobalamin 1000 MCG tablet Take 1,000 mcg by mouth daily.   Yes [provider]  loratadine (CLARITIN) 10 MG tablet Take 10 mg by mouth daily.   Yes [provider]  losartan (COZAAR) 100 MG tablet Take 100 mg by mouth daily.   Yes [provider]  molnupiravir  EUA (LAGEVRIO) 200 mg CAPS capsule Take 4 capsules (800 mg total) by mouth 2 (two) times daily for 5 days. 09/21/21 09/26/21 Yes Rodriguez-Southworth, Nettie Elm, PA-C  progesterone (PROMETRIUM) 200 MG capsule Take 200 mg by mouth at bedtime. 10/04/20  Yes [provider]  pyridOXINE (VITAMIN B-6) 50 MG tablet Take 50 mg by mouth 2 (two) times daily.   Yes [provider]  rOPINIRole (REQUIP) 0.25 MG tablet Take 0.25 mg by mouth at bedtime.   Yes [provider]    Family History Family History  Problem Relation Age of Onset   Stroke Mother    Prostate cancer Father    Breast cancer Neg Hx     Social History Social History   Tobacco Use   Smoking status: Never   Smokeless tobacco: Never  Vaping Use   Vaping Use: Never used  Substance Use Topics   Alcohol use: No   Drug use: No     Allergies   Aciphex [rabeprazole], Bactrim [sulfamethoxazole-trimethoprim], Ceftin [cefuroxime axetil], Protonix [pantoprazole], and Vilazodone hcl   Review of Systems Review of Systems  Constitutional:  Positive for appetite change, chills, fatigue and fever.  HENT:  Positive for congestion, postnasal drip and rhinorrhea. Negative for ear discharge and ear pain.   Eyes:  Negative for discharge.  Respiratory:  Positive for cough.   Gastrointestinal:  Negative for diarrhea, nausea and vomiting.  Skin:  Negative for rash.  Hematological:  Negative for adenopathy.    Physical Exam Triage Vital Signs ED Triage Vitals  Enc Vitals Group     BP 09/21/21 1657 136/77     Pulse Rate 09/21/21 1657 81     Resp 09/21/21 1657 18     Temp 09/21/21 1657 98.4 F (36.9 C)     Temp Source 09/21/21 1657 Oral     SpO2 09/21/21 1657 99 %     Weight 09/21/21 1654 233 lb 0.4 oz (105.7 kg)     Height 09/21/21 1654 5\' 2"  (1.575 m)     Head Circumference --      Peak Flow --      Pain Score 09/21/21 1653 0     Pain Loc --      Pain Edu? --      Excl. in GC? --    No data  found.  Updated Vital Signs BP 136/77 (BP Location: Right Arm)   Pulse 81   Temp 98.4 F (36.9 C) (Oral)   Resp 18   Ht 5\' 2"  (1.575 m)   Wt 233 lb 0.4 oz (105.7 kg)   SpO2 99%   BMI 42.62 kg/m   Visual Acuity Right Eye Distance:   Left Eye Distance:   Bilateral Distance:    Right Eye Near:   Left Eye Near:    Bilateral Near:     Physical Exam Physical Exam Vitals signs and nursing note  reviewed.  Constitutional:      General: She is not in acute distress.    Appearance: Normal appearance. She is not ill-appearing, toxic-appearing or diaphoretic.  HENT:     Head: Normocephalic.     Right Ear: Tympanic membrane, ear canal and external ear normal.     Left Ear: Tympanic membrane, ear canal and external ear normal.     Nose: clear mucous present, has moderate congestion    Mouth/Throat:     Mouth: Mucous membranes are moist.  Eyes:     General: No scleral icterus.       Right eye: No discharge.        Left eye: No discharge.     Conjunctiva/sclera: Conjunctivae normal.  Neck:     Musculoskeletal: Neck supple. No neck rigidity.  Cardiovascular:     Rate and Rhythm: Normal rate and regular rhythm.     Heart sounds: No murmur.  Pulmonary:     Effort: Pulmonary effort is normal.     Breath sounds: Normal breath sounds.   Musculoskeletal: Normal range of motion.  Lymphadenopathy:     Cervical: No cervical adenopathy.  Skin:    General: Skin is warm and dry.     Coloration: Skin is not jaundiced.     Findings: No rash.  Neurological:     Mental Status: She is alert and oriented to person, place, and time.     Gait: Gait normal.  Psychiatric:        Mood and Affect: Mood normal.        Behavior: Behavior normal.        Thought Content: Thought content normal.        Judgment: Judgment normal.    UC Treatments / Results  Labs (all labs ordered are listed, but only abnormal results are displayed) Labs Reviewed  POC SARS CORONAVIRUS 2 AG - Abnormal; Notable  for the following components:      Result Value   SARSCOV2ONAVIRUS 2 AG POSITIVE (*)    All other components within normal limits  RESP PANEL BY RT-PCR (FLU A&B, COVID) [id number]  INFLUENZA A AND B ANTIGEN (CONVERTED LAB)  POC INFLUENZA A AND B ANTIGEN (URGENT CARE ONLY)  POC SARS CORONAVIRUS 2 AG -  ED  Neg Flu test Rapid covid is positive   EKG   Radiology No results found.  Procedures Procedures (including critical care time)  Medications Ordered in UC Medications - No data to display  Initial Impression / Assessment and Plan / UC Course  I have reviewed the triage vital signs and the nursing notes.  Pertinent labs results that were available during my care of the patient were reviewed by me and considered in my medical decision making (see chart for details). Has covid infection and was placed on Molnupiravir and Tessalon perless as noted .     Final Clinical Impressions(s) / UC Diagnoses   Final diagnoses:  COVID-19 virus infection     Discharge Instructions      Stay home for 5 days from yesterday, and the other 5 days you must wear a mask.      ED Prescriptions     Medication Sig Dispense Auth. Provider   molnupiravir EUA (LAGEVRIO) 200 mg CAPS capsule Take 4 capsules (800 mg total) by mouth 2 (two) times daily for 5 days. 40 capsule Rodriguez-Southworth, Tej Murdaugh, PA-C   benzonatate (TESSALON) 200 MG capsule Take 1 capsule (200 mg total) by mouth 3 (three) times daily as  needed for cough. 30 capsule Rodriguez-Southworth, Nettie Elm, PA-C      PDMP not reviewed this encounter.   Garey Ham, [place] 09/21/21 1854

## 2021-09-21 NOTE — ED Triage Notes (Signed)
Pt c/o cough, nasal congestion, runny nose, fatigue and fever. Started about 3 days ago. She also states she can not smell or taste.

## 2021-09-21 NOTE — Discharge Instructions (Signed)
Stay home for 5 days from yesterday, and the other 5 days you must wear a mask.

## 2022-02-04 ENCOUNTER — Other Ambulatory Visit: Payer: Self-pay

## 2022-02-04 ENCOUNTER — Emergency Department
Admission: EM | Admit: 2022-02-04 | Discharge: 2022-02-04 | Disposition: A | Discharge status: Home / Self Care | Payer: BC Managed Care – PPO | Attending: Emergency Medicine | Admitting: Emergency Medicine

## 2022-02-04 ENCOUNTER — Encounter: Payer: Self-pay | Admitting: Emergency Medicine

## 2022-02-04 ENCOUNTER — Emergency Department: Payer: BC Managed Care – PPO

## 2022-02-04 DIAGNOSIS — I10 Essential (primary) hypertension: Secondary | ICD-10-CM | POA: Insufficient documentation

## 2022-02-04 DIAGNOSIS — K859 Acute pancreatitis without necrosis or infection, unspecified: Secondary | ICD-10-CM | POA: Insufficient documentation

## 2022-02-04 DIAGNOSIS — R1013 Epigastric pain: Secondary | ICD-10-CM | POA: Diagnosis present

## 2022-02-04 DIAGNOSIS — R079 Chest pain, unspecified: Secondary | ICD-10-CM

## 2022-02-04 LAB — CBC
HCT: 42.2 % (ref 36.0–46.0)
Hemoglobin: 13.8 g/dL (ref 12.0–15.0)
MCH: 29.9 pg (ref 26.0–34.0)
MCHC: 32.7 g/dL (ref 30.0–36.0)
MCV: 91.5 fL (ref 80.0–100.0)
Platelets: 327 10*3/uL (ref 150–400)
RBC: 4.61 MIL/uL (ref 3.87–5.11)
RDW: 12.4 % (ref 11.5–15.5)
WBC: 9.8 10*3/uL (ref 4.0–10.5)
nRBC: 0 % (ref 0.0–0.2)

## 2022-02-04 LAB — HEPATIC FUNCTION PANEL
ALT: 14 U/L (ref 0–44)
AST: 16 U/L (ref 15–41)
Albumin: 3.6 g/dL (ref 3.5–5.0)
Alkaline Phosphatase: 69 U/L (ref 38–126)
Bilirubin, Direct: 0.1 mg/dL (ref 0.0–0.2)
Indirect Bilirubin: 0.7 mg/dL (ref 0.3–0.9)
Total Bilirubin: 0.8 mg/dL (ref 0.3–1.2)
Total Protein: 6.6 g/dL (ref 6.5–8.1)

## 2022-02-04 LAB — BASIC METABOLIC PANEL
Anion gap: 7 (ref 5–15)
BUN: 13 mg/dL (ref 6–20)
CO2: 29 mmol/L (ref 22–32)
Calcium: 9.3 mg/dL (ref 8.9–10.3)
Chloride: 102 mmol/L (ref 98–111)
Creatinine, Ser: 0.8 mg/dL (ref 0.44–1.00)
GFR, Estimated: >60 mL/min (ref >60)
Glucose, Bld: 103 mg/dL — ABNORMAL HIGH (ref 70–99)
Potassium: 4.1 mmol/L (ref 3.5–5.1)
Sodium: 138 mmol/L (ref 135–145)

## 2022-02-04 LAB — TROPONIN I (HIGH SENSITIVITY): Troponin I (High Sensitivity): 3 ng/L (ref <18)

## 2022-02-04 LAB — LIPASE, BLOOD: Lipase: 68 U/L — ABNORMAL HIGH (ref 11–51)

## 2022-02-04 MED ORDER — LIDOCAINE VISCOUS HCL 2 % MT SOLN
15.0000 mL | Freq: Once | OROMUCOSAL | Status: DC
  Filled 2022-02-04: qty 15

## 2022-02-04 MED ORDER — HYDROCODONE-ACETAMINOPHEN 5-325 MG PO TABS: 1.0000 | ORAL_TABLET | ORAL | 0 refills | Status: DC | PRN

## 2022-02-04 MED ORDER — ALUM & MAG HYDROXIDE-SIMETH 200-200-20 MG/5ML PO SUSP
30.0000 mL | Freq: Once | ORAL | Status: DC
  Filled 2022-02-04: qty 30

## 2022-02-04 MED ORDER — ONDANSETRON 4 MG PO TBDP: 4.0000 mg | ORAL_TABLET | Freq: Three times a day (TID) | ORAL | 0 refills | Status: DC | PRN

## 2022-02-04 NOTE — ED Triage Notes (Signed)
Pt to ED via POV with c/o mid sternal chest pain that goes into her stomach and back. She had it a few days ago drove over here felt better and left, she got the pain back today went to [facility] and they sent her here. Pt is under stress, just lost her mom in Dec and now her dad is terminal with Cancer. She is having SHOB with nausea no diaphoresis

## 2022-02-04 NOTE — ED Provider Notes (Signed)
[facility] Provider Note    Event Date/Time   First MD Initiated Contact with Patient 02/04/22 1618     (approximate)  History   Chief Complaint: Chest Pain  HPI  CAIA LOFARO is a 61 y.o. female with a past medical history of anxiety, depression, hypertension, presents to the emergency department for upper abdominal/lower chest pain.  According to the patient over the past week or so she has been experiencing vague pain in the upper abdomen/lower chest.  Patient states a history of severe gastric reflux which she thought could be the cause of her symptoms took Tums without much relief.  Patient denies any vomiting.  Denies any diarrhea.  Denies any fever.  No shortness of breath, no pleuritic pain.  Physical Exam   Triage Vital Signs: ED Triage Vitals  Enc Vitals Group     BP 02/04/22 1618 136/61     Pulse Rate 02/04/22 1619 71     Resp 02/04/22 1619 15     Temp --      Temp src --      SpO2 02/04/22 1619 97 %     Weight 02/04/22 1543 233 lb (105.7 kg)     Height 02/04/22 1543 5\' 3"  (1.6 m)     Head Circumference --      Peak Flow --      Pain Score 02/04/22 1542 5     Pain Loc --      Pain Edu? --      Excl. in [place]? --     Most recent vital signs: Vitals:   02/04/22 1630 02/04/22 1730  BP: 124/67 119/68  Pulse: 75 71  Resp: 15 14  SpO2: 99% 100%    General: Awake, no distress.  CV:  Good peripheral perfusion.  Regular rate and rhythm  Resp:  Normal effort.  Equal breath sounds bilaterally.  Abd:  No distention.  Soft, mild epigastric tenderness palpation without rebound guarding or distention.    ED Results / Procedures / Treatments   EKG  EKG viewed and interpreted by myself shows a normal sinus rhythm at 69 bpm with a narrow QRS, left axis deviation, largely normal intervals, no concerning ST changes.  RADIOLOGY  I personally reviewed the chest x-ray images, no acute finding on my evaluation. Radiology is read the chest  x-ray is negative.   MEDICATIONS ORDERED IN ED: Medications  alum & mag hydroxide-simeth (MAALOX/MYLANTA) 200-200-20 MG/5ML suspension 30 mL (30 mLs Oral Patient Refused/Not Given 02/04/22 1703)    And  lidocaine (XYLOCAINE) 2 % viscous mouth solution 15 mL (15 mLs Oral Patient Refused/Not Given 02/04/22 1703)     IMPRESSION / MDM / ASSESSMENT AND PLAN / ED COURSE  I reviewed the triage vital signs and the nursing notes.  Patient presents emergency department for epigastric discomfort/lower chest discomfort over the past 1 week or so.  Patient states a very mild vague discomfort.  Given the patient's slight epigastric tenderness we will check labs including LFTs and lipase.  Patient states she is status post cholecystectomy many years ago.  Does not drink alcohol.  Patient's labs have resulted showing reassuringly normal troponin.  Normal LFTs normal chemistry and normal CBC.  Patient does have a mildly elevated lipase which could indicate mild pancreatitis.  Patient does not drink alcohol, status post cholecystectomy.  I discussed with the patient CT imaging to further evaluate, patient states she will hold off for imaging on now and try treatment  with the diet plan I discussed with the patient.  I discussed with the patient if her symptoms do not resolve within the next 7 days or if she has worsening pain at any point she should return to the emergency department for CT imaging.  I did consider admission for this patient however given her reassuring work-up and mild pancreatitis, we will discharge with short course of pain and nausea medication.  Discussed diet plan with the patient and PCP follow-up.  Patient agreeable to plan of care.  Patient states she has a PCP appointment this week.  FINAL CLINICAL IMPRESSION(S) / ED DIAGNOSES   Pancreatitis  Rx / DC Orders   Norco Zofran PCP follow-up  Note:  This document was prepared using Dragon voice recognition software and may include  unintentional dictation errors.   Minna Antis, MD 02/04/22 [phone]

## 2022-02-04 NOTE — ED Notes (Signed)
Per MD, no need for 2nd trop. Ok to [employer]

## 2022-08-30 DIAGNOSIS — E782 Mixed hyperlipidemia: Secondary | ICD-10-CM | POA: Diagnosis not present

## 2022-08-30 DIAGNOSIS — I10 Essential (primary) hypertension: Secondary | ICD-10-CM | POA: Diagnosis not present

## 2022-09-03 DIAGNOSIS — N951 Menopausal and female climacteric states: Secondary | ICD-10-CM | POA: Diagnosis not present

## 2022-09-03 DIAGNOSIS — E782 Mixed hyperlipidemia: Secondary | ICD-10-CM | POA: Diagnosis not present

## 2022-09-03 DIAGNOSIS — Z1211 Encounter for screening for malignant neoplasm of colon: Secondary | ICD-10-CM | POA: Diagnosis not present

## 2022-09-03 DIAGNOSIS — R6882 Decreased libido: Secondary | ICD-10-CM | POA: Diagnosis not present

## 2022-09-03 DIAGNOSIS — I10 Essential (primary) hypertension: Secondary | ICD-10-CM | POA: Diagnosis not present

## 2022-09-28 DIAGNOSIS — Z23 Encounter for immunization: Secondary | ICD-10-CM | POA: Diagnosis not present

## 2022-09-29 IMAGING — CR DG CHEST 2V
2 series · 2 of 2 positions shown · non-contrast
Comparison: None.

CLINICAL DATA: Shortness of breath

EXAM:
CHEST - 2 VIEW

[chest lat]
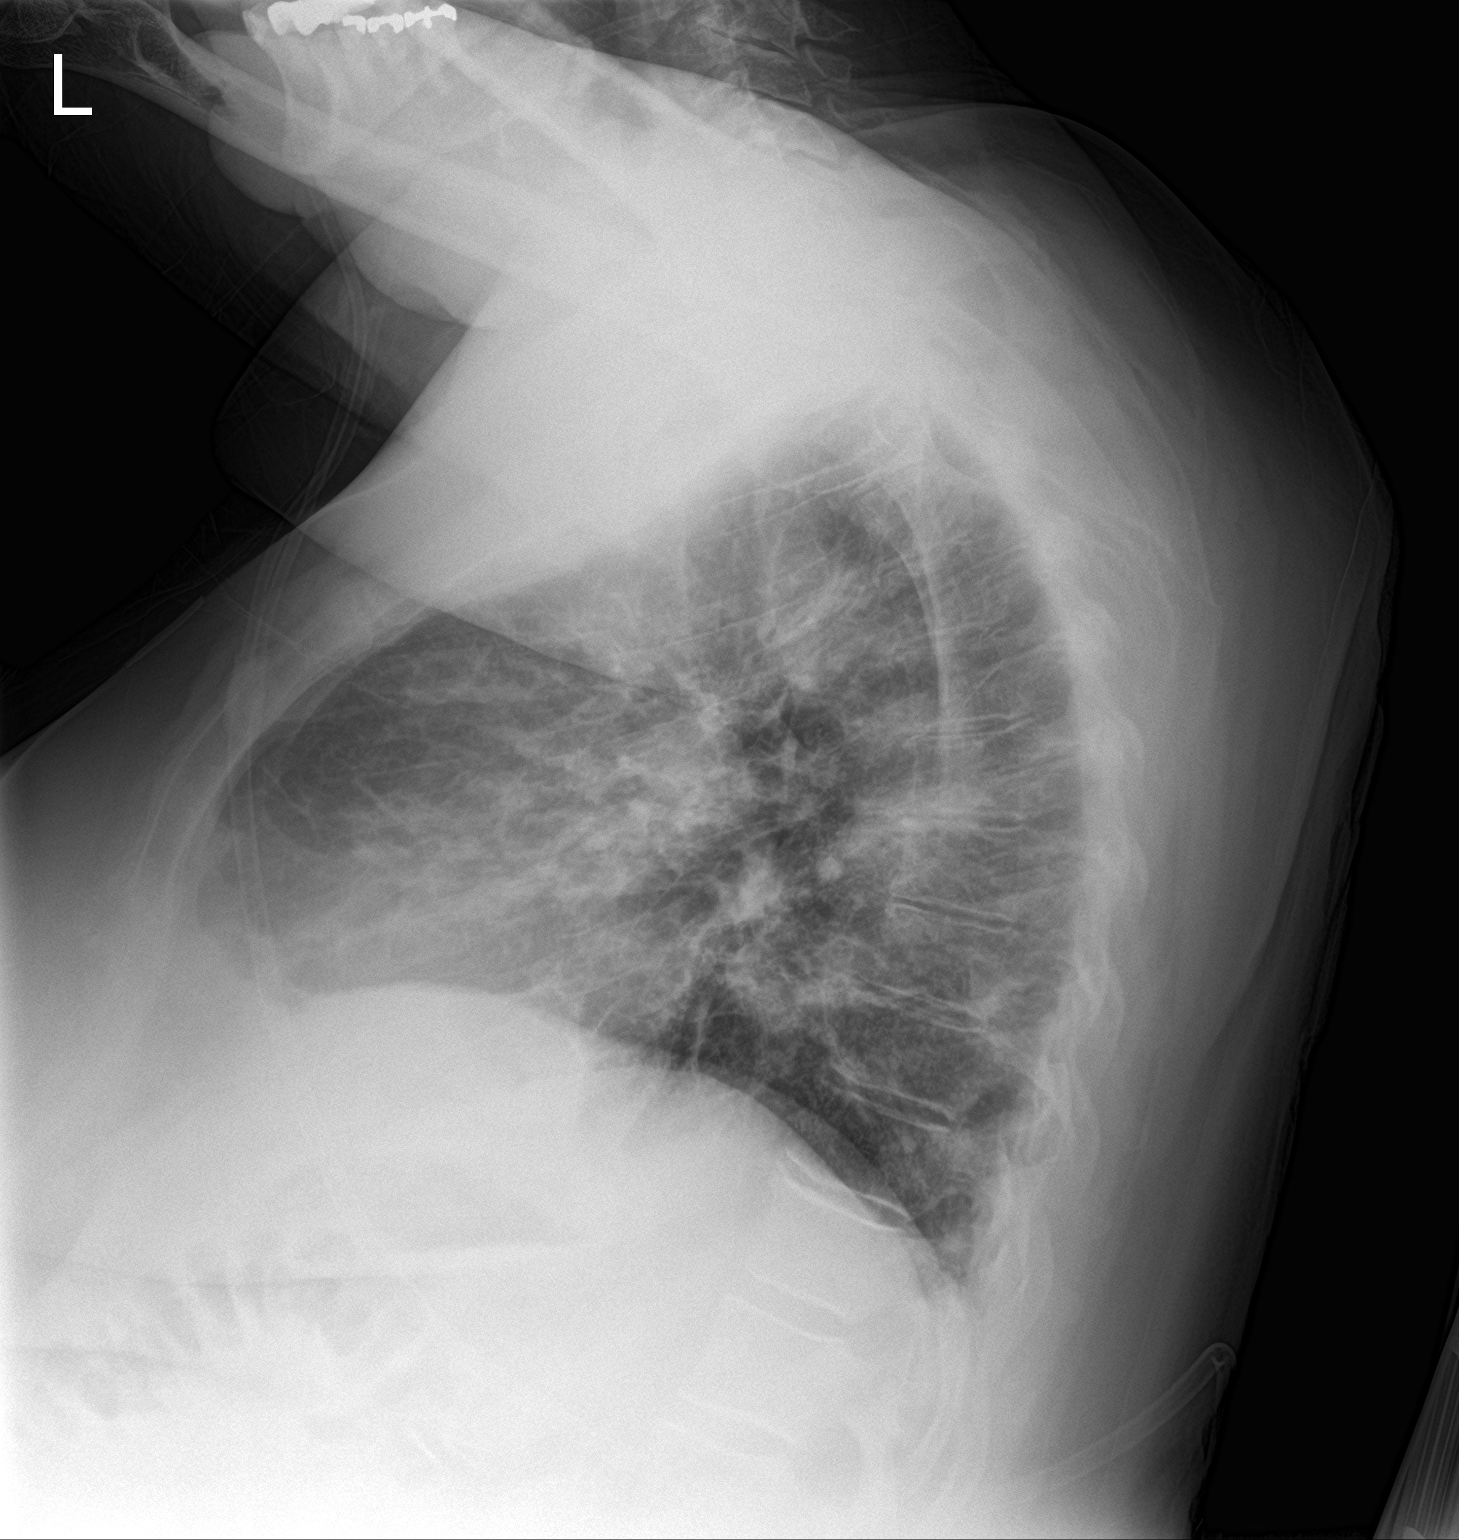

[chest ap]
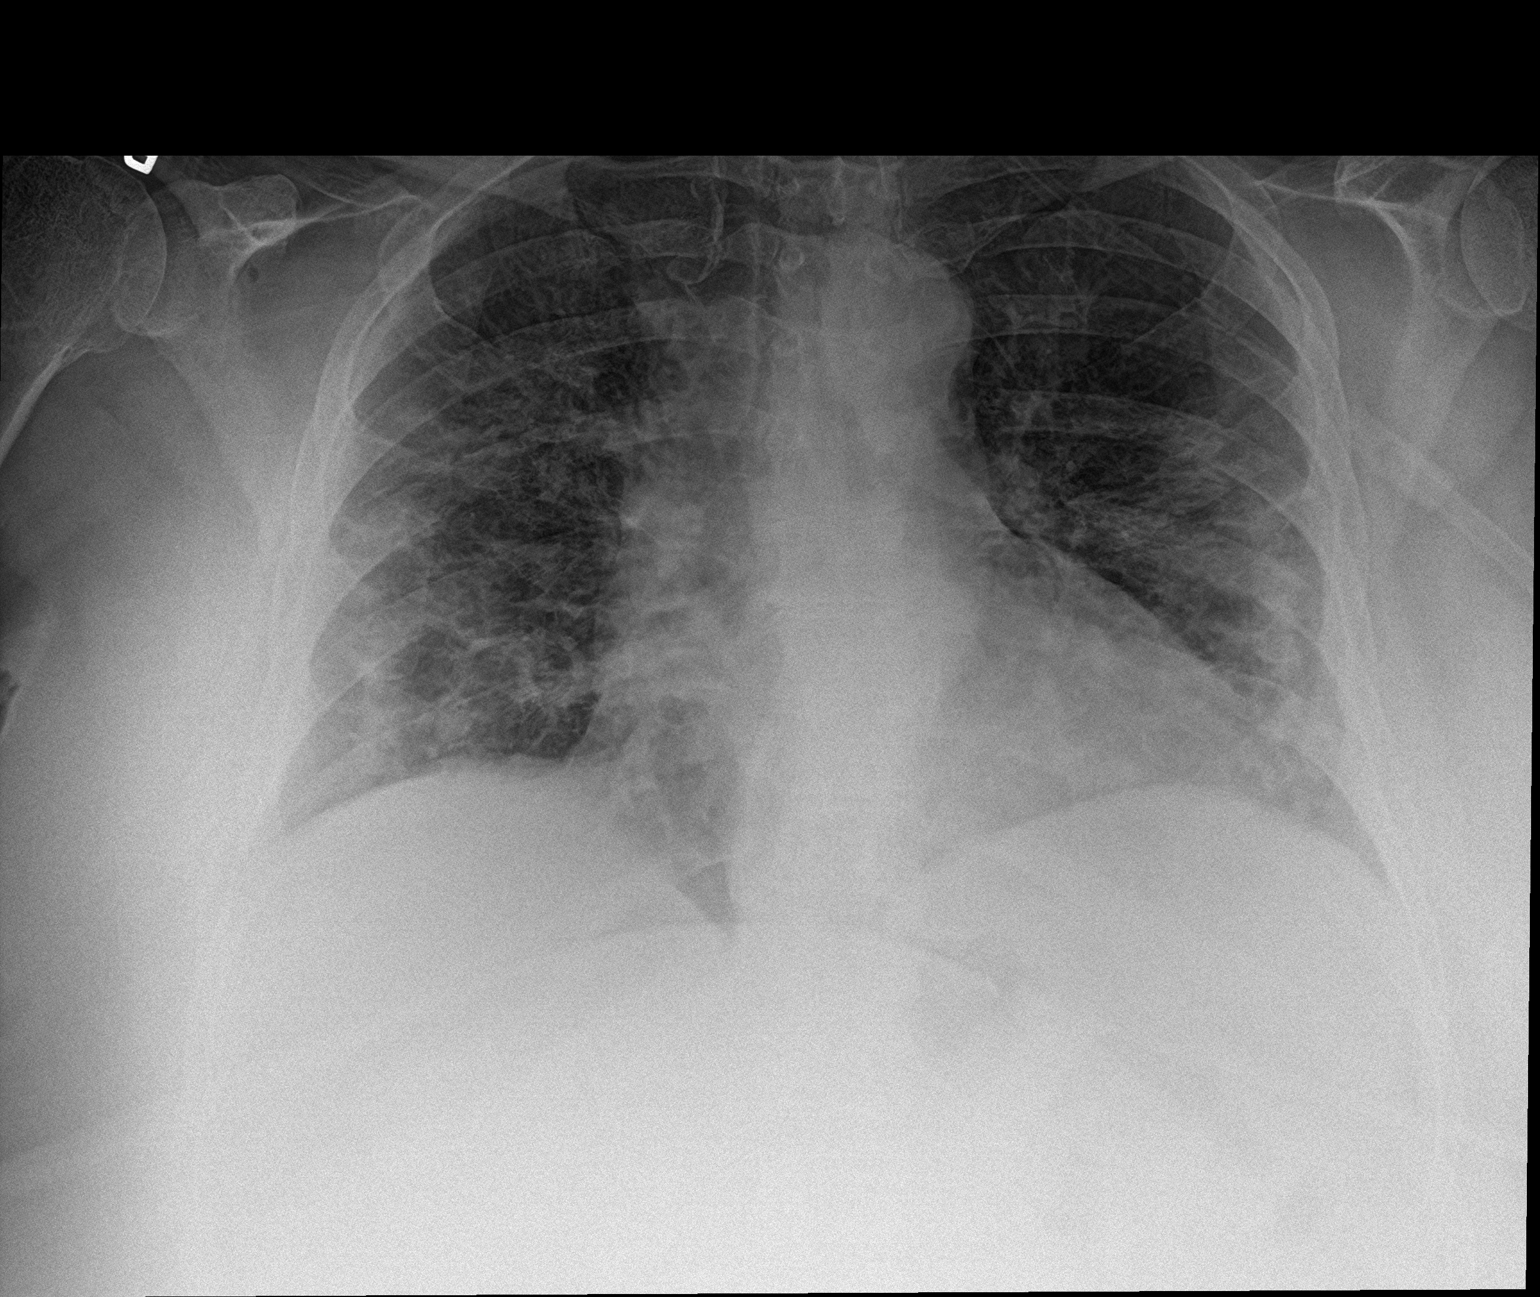

[2 of 2 positions shown; findings below may reference images not displayed]

FINDINGS: The heart size and mediastinal contours are within normal limits.
Patchy airspace opacities are seen at the periphery of both lower
lungs. The visualized skeletal structures are unremarkable.
IMPRESSION: Patchy airspace opacities at the periphery of both lower lungs,
likely consistent with multifocal pneumonia.

## 2022-10-18 DIAGNOSIS — I10 Essential (primary) hypertension: Secondary | ICD-10-CM | POA: Diagnosis not present

## 2022-10-18 DIAGNOSIS — R300 Dysuria: Secondary | ICD-10-CM | POA: Diagnosis not present

## 2022-10-30 ENCOUNTER — Ambulatory Visit (INDEPENDENT_AMBULATORY_CARE_PROVIDER_SITE_OTHER): Payer: BC Managed Care – PPO

## 2022-10-30 ENCOUNTER — Ambulatory Visit: Payer: BC Managed Care – PPO | Admitting: Podiatry

## 2022-10-30 DIAGNOSIS — M19072 Primary osteoarthritis, left ankle and foot: Secondary | ICD-10-CM | POA: Diagnosis not present

## 2022-10-30 NOTE — Progress Notes (Signed)
Chief Complaint  Patient presents with   Nail Problem    Patient is here for nail fungus bilateral great toe, bilateral bunions.    HPI: 61 y.o. female presenting today as a new patient for bilateral foot evaluation.  Patient states that she was told she has bunions and presents for further treatment and evaluation.  She also has not noticed discoloration with thickening to the bilateral great toenails.  She has tried [employer] OTC with minimal improvement.  Past Medical History:  Diagnosis Date   Anxiety    Chicken pox    Chronic back pain    Depression    GERD (gastroesophageal reflux disease)    History of kidney stones    Hypertension    Obesity    Restless leg syndrome     Past Surgical History:  Procedure Laterality Date   ABDOMINAL HYSTERECTOMY     BACK SURGERY     lower back; metal in lower back   CHOLECYSTECTOMY     CYSTOSCOPY/URETEROSCOPY/HOLMIUM LASER/STENT PLACEMENT Left 10/31/2019   Procedure: ,left stent placement,left retrograde pyelogram,cystoscopy;  Surgeon: Orson Ape, MD;  Location: [facility] ORS;  Service: Urology;  Laterality: Left;   ESOPHAGOGASTRODUODENOSCOPY (EGD) WITH PROPOFOL N/A 05/24/2017   Procedure: ESOPHAGOGASTRODUODENOSCOPY (EGD) WITH PROPOFOL;  Surgeon: Christena Deem, MD;  Location: [facility] ENDOSCOPY;  Service: Endoscopy;  Laterality: N/A;   EXTRACORPOREAL SHOCK WAVE LITHOTRIPSY Left 10/29/2019   Procedure: EXTRACORPOREAL SHOCK WAVE LITHOTRIPSY (ESWL);  Surgeon: Orson Ape, MD;  Location: [facility] ORS;  Service: Urology;  Laterality: Left;   INCISIONAL HERNIA REPAIR N/A 06/01/2019   Procedure: LAPAROSCOPIC INCISIONAL HERNIA REPAIR WITH MESH;  Surgeon: Carolan Shiver, MD;  Location: [facility] ORS;  Service: General;  Laterality: N/A;    Allergies  Allergen Reactions   Aciphex [Rabeprazole] Swelling   Bactrim [Sulfamethoxazole-Trimethoprim] Diarrhea and Nausea Only   Ceftin [Cefuroxime Axetil] Hives   Protonix [Pantoprazole] Hives and  Swelling   Vilazodone Hcl Hives and Other (See Comments)    Tongue swelling     Physical Exam: General: The patient is alert and oriented x3 in no acute distress.  Dermatology: Skin is warm, dry and supple bilateral lower extremities. Negative for open lesions or macerations.  Hyperkeratotic dystrophic discolored nails noted to the bilateral great toes  Vascular: Palpable pedal pulses bilaterally. Capillary refill within normal limits.  Negative for any significant edema or erythema  Neurological: Light touch and protective threshold grossly intact  Musculoskeletal Exam: No pedal deformities noted.  No pain throughout palpation of the bilateral feet  Radiographic Exam B/L feet 10/30/2022:  Normal osseous mineralization. Joint spaces preserved with exception of some joint space narrowing with periarticular spurring to the first MTP of the right foot. No fracture/dislocation/boney destruction.    Assessment: 1.  Mild DJD left foot 2.  Onychomycosis of toenails bilateral great toes   Plan of Care:  1. Patient evaluated. X-Rays reviewed.  2.  Mechanical debridement of the bilateral great toenails was performed today using a nail nipper without incident or bleeding 3.  Tolcylen antifungal topical dispensed at checkout apply daily 4.  Recommend good supportive shoes and sneakers 5.  Return to clinic as needed      Felecia Shelling, DPM [facility]  Dr. Felecia Shelling, DPM    [address]  [place], [place] [address]                Office [phone]  Fax [phone]

## 2022-12-07 ENCOUNTER — Ambulatory Visit
Admission: EM | Admit: 2022-12-07 | Discharge: 2022-12-07 | Disposition: A | Discharge status: Home / Self Care | Payer: BC Managed Care – PPO | Attending: Urgent Care | Admitting: Urgent Care

## 2022-12-07 DIAGNOSIS — N3001 Acute cystitis with hematuria: Secondary | ICD-10-CM | POA: Diagnosis not present

## 2022-12-07 LAB — POCT URINALYSIS DIP (MANUAL ENTRY)
Bilirubin, UA: NEGATIVE
Glucose, UA: NEGATIVE mg/dL
Ketones, POC UA: NEGATIVE mg/dL
Nitrite, UA: NEGATIVE
Protein Ur, POC: NEGATIVE mg/dL
Spec Grav, UA: 1.01 (ref 1.010–1.025)
Urobilinogen, UA: 0.2 E.U./dL
pH, UA: 6 (ref 5.0–8.0)

## 2022-12-07 MED ORDER — AMOXICILLIN-POT CLAVULANATE 875-125 MG PO TABS: 1.0000 | ORAL_TABLET | Freq: Two times a day (BID) | ORAL | 0 refills | Status: AC

## 2022-12-07 NOTE — ED Triage Notes (Signed)
Pt. Presents to UC w/ c/o dysuria, urinary frequency and foul-smelling urine for the past 2 days.

## 2022-12-07 NOTE — ED Provider Notes (Signed)
Brittany Cain    CSN: [id number] Arrival date & time: 12/07/22  1614      History   Chief Complaint Chief Complaint  Patient presents with   Dysuria   Urinary Frequency    HPI Brittany Cain is a 61 y.o. female.    Dysuria Urinary Frequency    Resents to urgent care with complaint of dysuria, frequency, foul-smelling urine x 2 days.  Denies fever, chills, myalgias.  Denies back pain. denies abdominal pain.  Has frequent UTIs with most recent episode 10/18/2022 treated with amoxicillin.  Past Medical History:  Diagnosis Date   Anxiety    Chicken pox    Chronic back pain    Depression    GERD (gastroesophageal reflux disease)    History of kidney stones    Hypertension    Obesity    Restless leg syndrome     Patient Active Problem List   Diagnosis Date Noted   Pneumonia due to COVID-19 virus 10/13/2020   Acute respiratory failure due to COVID-19 (HCC) 10/13/2020   Gastroenteritis due to COVID-19 virus 10/13/2020   Obesity, Class III, BMI 40-49.9 (morbid obesity) (HCC) 10/13/2020   Chronic back pain 10/13/2020   Depression 10/13/2020   Sepsis (HCC) 10/31/2019   Ureterolithiasis 10/31/2019   Essential hypertension 10/31/2019   Hyperglycemia 10/31/2019   Restless leg syndrome    Obesity    Major depressive disorder, recurrent episode, severe (HCC) 04/09/2014   MDD (major depressive disorder), recurrent episode, severe (HCC) 04/08/2014   GAD (generalized anxiety disorder) 03/25/2014    Past Surgical History:  Procedure Laterality Date   ABDOMINAL HYSTERECTOMY     BACK SURGERY     lower back; metal in lower back   CHOLECYSTECTOMY     CYSTOSCOPY/URETEROSCOPY/HOLMIUM LASER/STENT PLACEMENT Left 10/31/2019   Procedure: ,left stent placement,left retrograde pyelogram,cystoscopy;  Surgeon: Orson Ape, MD;  Location: [facility] ORS;  Service: Urology;  Laterality: Left;   ESOPHAGOGASTRODUODENOSCOPY (EGD) WITH PROPOFOL N/A 05/24/2017   Procedure:  ESOPHAGOGASTRODUODENOSCOPY (EGD) WITH PROPOFOL;  Surgeon: Christena Deem, MD;  Location: [facility] ENDOSCOPY;  Service: Endoscopy;  Laterality: N/A;   EXTRACORPOREAL SHOCK WAVE LITHOTRIPSY Left 10/29/2019   Procedure: EXTRACORPOREAL SHOCK WAVE LITHOTRIPSY (ESWL);  Surgeon: Orson Ape, MD;  Location: [facility] ORS;  Service: Urology;  Laterality: Left;   INCISIONAL HERNIA REPAIR N/A 06/01/2019   Procedure: LAPAROSCOPIC INCISIONAL HERNIA REPAIR WITH MESH;  Surgeon: Carolan Shiver, MD;  Location: [facility] ORS;  Service: General;  Laterality: N/A;    OB History   No obstetric history on file.      Home Medications    Prior to Admission medications   Medication Sig Start Date End Date Taking? Authorizing Provider  acetaminophen (TYLENOL) 325 MG tablet Take 325-650 mg by mouth every 6 (six) hours as needed for mild pain or fever.    [provider]  ARMOUR THYROID 60 MG tablet Take 60 mg by mouth daily. 10/04/20   [provider]  atorvastatin (LIPITOR) 40 MG tablet Take 40 mg by mouth at bedtime.     [provider]  benzonatate (TESSALON) 200 MG capsule Take 1 capsule (200 mg total) by mouth 3 (three) times daily as needed for cough. 09/21/21   Rodriguez-Southworth, Nettie Elm, PA-C  buPROPion (WELLBUTRIN SR) 200 MG 12 hr tablet Take 200 mg by mouth 2 (two) times daily.    [provider]  Cholecalciferol (VITAMIN D3) 125 MCG (5000 UT) CAPS Take 5,000 Units by mouth daily.  [provider]  cyanocobalamin 1000 MCG tablet Take 1,000 mcg by mouth daily.    [provider]  HYDROcodone-acetaminophen (NORCO/VICODIN) 5-325 MG tablet Take 1 tablet by mouth every 4 (four) hours as needed. 02/04/22   Harvest Dark, MD  loratadine (CLARITIN) 10 MG tablet Take 10 mg by mouth daily.    [provider]  losartan (COZAAR) 100 MG tablet Take 100 mg by mouth daily.    [provider]  ondansetron (ZOFRAN-ODT) 4 MG disintegrating  tablet Take 1 tablet (4 mg total) by mouth every 8 (eight) hours as needed for nausea or vomiting. 02/04/22   Harvest Dark, MD  progesterone (PROMETRIUM) 200 MG capsule Take 200 mg by mouth at bedtime. 10/04/20   [provider]  pyridOXINE (VITAMIN B-6) 50 MG tablet Take 50 mg by mouth 2 (two) times daily.    [provider]  rOPINIRole (REQUIP) 0.25 MG tablet Take 0.25 mg by mouth at bedtime.    [provider]    Family History Family History  Problem Relation Age of Onset   Stroke Mother    Prostate cancer Father    Breast cancer Neg Hx     Social History Social History   Tobacco Use   Smoking status: Never   Smokeless tobacco: Never  Vaping Use   Vaping Use: Never used  Substance Use Topics   Alcohol use: No   Drug use: No     Allergies   Aciphex [rabeprazole], Bactrim [sulfamethoxazole-trimethoprim], Ceftin [cefuroxime axetil], Protonix [pantoprazole], and Vilazodone hcl   Review of Systems Review of Systems  Genitourinary:  Positive for dysuria and frequency.     Physical Exam Triage Vital Signs ED Triage Vitals  Enc Vitals Group     BP      Pulse      Resp      Temp      Temp src      SpO2      Weight      Height      Head Circumference      Peak Flow      Pain Score      Pain Loc      Pain Edu?      Excl. in [place]?    No data found.  Updated Vital Signs There were no vitals taken for this visit.  Visual Acuity Right Eye Distance:   Left Eye Distance:   Bilateral Distance:    Right Eye Near:   Left Eye Near:    Bilateral Near:     Physical Exam Vitals reviewed.  Constitutional:      Appearance: Normal appearance.  Skin:    General: Skin is warm and dry.  Neurological:     General: No focal deficit present.     Mental Status: She is alert and oriented to person, place, and time.  Psychiatric:        Mood and Affect: Mood normal.        Behavior: Behavior normal.      UC Treatments / Results   Labs (all labs ordered are listed, but only abnormal results are displayed) Labs Reviewed - No data to display  EKG   Radiology No results found.  Procedures Procedures (including critical care time)  Medications Ordered in UC Medications - No data to display  Initial Impression / Assessment and Plan / UC Course  I have reviewed the triage vital signs and the nursing notes.  Pertinent labs & imaging  results that were available during my care of the patient were reviewed by me and considered in my medical decision making (see chart for details).   UA is significant for small leukocytes and small blood.  Recently treated with amoxicillin.  She is allergic to Bactrim and recently resistant to Cipro.  Will try Augmentin and send for culture.   Final Clinical Impressions(s) / UC Diagnoses   Final diagnoses:  None   Discharge Instructions   None    ED Prescriptions   None    PDMP not reviewed this encounter.   Rose Phi, Marianne 12/07/22 1718

## 2022-12-07 NOTE — Discharge Instructions (Signed)
Follow up here or with your primary care provider if your symptoms are worsening or not improving with treatment.     

## 2023-01-22 DIAGNOSIS — M160 Bilateral primary osteoarthritis of hip: Secondary | ICD-10-CM | POA: Diagnosis not present

## 2023-01-22 DIAGNOSIS — G252 Other specified forms of tremor: Secondary | ICD-10-CM | POA: Diagnosis not present

## 2023-01-22 DIAGNOSIS — M5136 Other intervertebral disc degeneration, lumbar region: Secondary | ICD-10-CM | POA: Diagnosis not present

## 2023-01-22 DIAGNOSIS — G8929 Other chronic pain: Secondary | ICD-10-CM | POA: Diagnosis not present

## 2023-01-30 DIAGNOSIS — M461 Sacroiliitis, not elsewhere classified: Secondary | ICD-10-CM | POA: Diagnosis not present

## 2023-02-11 DIAGNOSIS — M461 Sacroiliitis, not elsewhere classified: Secondary | ICD-10-CM | POA: Diagnosis not present

## 2023-02-27 DIAGNOSIS — Z1211 Encounter for screening for malignant neoplasm of colon: Secondary | ICD-10-CM | POA: Diagnosis not present

## 2023-02-27 DIAGNOSIS — Z01818 Encounter for other preprocedural examination: Secondary | ICD-10-CM | POA: Diagnosis not present

## 2023-03-06 DIAGNOSIS — G252 Other specified forms of tremor: Secondary | ICD-10-CM | POA: Diagnosis not present

## 2023-03-11 DIAGNOSIS — M5416 Radiculopathy, lumbar region: Secondary | ICD-10-CM | POA: Diagnosis not present

## 2023-03-11 DIAGNOSIS — M25551 Pain in right hip: Secondary | ICD-10-CM | POA: Diagnosis not present

## 2023-03-11 DIAGNOSIS — M25552 Pain in left hip: Secondary | ICD-10-CM | POA: Diagnosis not present

## 2023-03-11 DIAGNOSIS — M542 Cervicalgia: Secondary | ICD-10-CM | POA: Diagnosis not present

## 2023-03-11 DIAGNOSIS — M461 Sacroiliitis, not elsewhere classified: Secondary | ICD-10-CM | POA: Diagnosis not present

## 2023-03-14 DIAGNOSIS — R739 Hyperglycemia, unspecified: Secondary | ICD-10-CM | POA: Diagnosis not present

## 2023-03-14 DIAGNOSIS — I10 Essential (primary) hypertension: Secondary | ICD-10-CM | POA: Diagnosis not present

## 2023-03-14 DIAGNOSIS — G2581 Restless legs syndrome: Secondary | ICD-10-CM | POA: Diagnosis not present

## 2023-03-14 DIAGNOSIS — E782 Mixed hyperlipidemia: Secondary | ICD-10-CM | POA: Diagnosis not present

## 2023-03-21 ENCOUNTER — Other Ambulatory Visit: Payer: Self-pay | Admitting: Physician Assistant

## 2023-03-21 DIAGNOSIS — M5416 Radiculopathy, lumbar region: Secondary | ICD-10-CM

## 2023-03-27 ENCOUNTER — Ambulatory Visit: Payer: BC Managed Care – PPO

## 2023-04-02 DIAGNOSIS — E782 Mixed hyperlipidemia: Secondary | ICD-10-CM | POA: Diagnosis not present

## 2023-04-02 DIAGNOSIS — F331 Major depressive disorder, recurrent, moderate: Secondary | ICD-10-CM | POA: Diagnosis not present

## 2023-04-02 DIAGNOSIS — I10 Essential (primary) hypertension: Secondary | ICD-10-CM | POA: Diagnosis not present

## 2023-04-03 ENCOUNTER — Ambulatory Visit: Payer: BC Managed Care – PPO

## 2023-05-09 ENCOUNTER — Ambulatory Visit
Admission: EM | Admit: 2023-05-09 | Discharge: 2023-05-09 | Disposition: A | Discharge status: Home / Self Care | Payer: BC Managed Care – PPO | Attending: Emergency Medicine | Admitting: Emergency Medicine

## 2023-05-09 ENCOUNTER — Encounter: Payer: Self-pay | Admitting: Emergency Medicine

## 2023-05-09 ENCOUNTER — Ambulatory Visit (INDEPENDENT_AMBULATORY_CARE_PROVIDER_SITE_OTHER): Payer: BC Managed Care – PPO

## 2023-05-09 DIAGNOSIS — N390 Urinary tract infection, site not specified: Secondary | ICD-10-CM | POA: Insufficient documentation

## 2023-05-09 DIAGNOSIS — R109 Unspecified abdominal pain: Secondary | ICD-10-CM | POA: Diagnosis not present

## 2023-05-09 DIAGNOSIS — B3731 Acute candidiasis of vulva and vagina: Secondary | ICD-10-CM | POA: Diagnosis not present

## 2023-05-09 LAB — URINALYSIS, W/ REFLEX TO CULTURE (INFECTION SUSPECTED)
Bilirubin Urine: NEGATIVE
Glucose, UA: NEGATIVE mg/dL
Ketones, ur: NEGATIVE mg/dL
Nitrite: POSITIVE — AB
Protein, ur: NEGATIVE mg/dL
Specific Gravity, Urine: 1.01 (ref 1.005–1.030)
pH: 6 (ref 5.0–8.0)

## 2023-05-09 MED ORDER — FLUCONAZOLE 150 MG PO TABS: 150.0000 mg | ORAL_TABLET | ORAL | 0 refills | Status: AC

## 2023-05-09 MED ORDER — LEVOFLOXACIN 500 MG PO TABS: 500.0000 mg | ORAL_TABLET | Freq: Every day | ORAL | 0 refills | Status: DC

## 2023-05-09 MED ORDER — PHENAZOPYRIDINE HCL 200 MG PO TABS: 200.0000 mg | ORAL_TABLET | Freq: Three times a day (TID) | ORAL | 0 refills | Status: DC

## 2023-05-09 NOTE — ED Provider Notes (Signed)
[facility]    CSN: [id number] Arrival date & time: 05/09/23  1550      History   Chief Complaint Chief Complaint  Patient presents with   Abdominal Pain    HPI Brittany Cain is a 62 y.o. female.   HPI  62 year old female presenting for evaluation of right flank pain for the last 3 days along with pain with urination, foul-smelling urine, urinary urgency and frequency.  She also reports increased nocturia.  She denies fever, vomiting, or blood in her urine.  She thinks she was told she had a kidney stone previously but she is unsure.   Past Medical History:  Diagnosis Date   Anxiety    Chicken pox    Chronic back pain    Depression    GERD (gastroesophageal reflux disease)    History of kidney stones    Hypertension    Obesity    Restless leg syndrome     Patient Active Problem List   Diagnosis Date Noted   Pneumonia due to COVID-19 virus 10/13/2020   Acute respiratory failure due to COVID-19 (HCC) 10/13/2020   Gastroenteritis due to COVID-19 virus 10/13/2020   Obesity, Class III, BMI 40-49.9 (morbid obesity) (HCC) 10/13/2020   Chronic back pain 10/13/2020   Depression 10/13/2020   Sepsis (HCC) 10/31/2019   Ureterolithiasis 10/31/2019   Essential hypertension 10/31/2019   Hyperglycemia 10/31/2019   Restless leg syndrome    Obesity    Major depressive disorder, recurrent episode, severe (HCC) 04/09/2014   MDD (major depressive disorder), recurrent episode, severe (HCC) 04/08/2014   GAD (generalized anxiety disorder) 03/25/2014    Past Surgical History:  Procedure Laterality Date   ABDOMINAL HYSTERECTOMY     BACK SURGERY     lower back; metal in lower back   CHOLECYSTECTOMY     CYSTOSCOPY/URETEROSCOPY/HOLMIUM LASER/STENT PLACEMENT Left 10/31/2019   Procedure: ,left stent placement,left retrograde pyelogram,cystoscopy;  Surgeon: Orson Ape, MD;  Location: [facility];  Service: Urology;  Laterality: Left;   ESOPHAGOGASTRODUODENOSCOPY (EGD)  WITH PROPOFOL N/A 05/24/2017   Procedure: ESOPHAGOGASTRODUODENOSCOPY (EGD) WITH PROPOFOL;  Surgeon: Christena Deem, MD;  Location: [facility];  Service: Endoscopy;  Laterality: N/A;   EXTRACORPOREAL SHOCK WAVE LITHOTRIPSY Left 10/29/2019   Procedure: EXTRACORPOREAL SHOCK WAVE LITHOTRIPSY (ESWL);  Surgeon: Orson Ape, MD;  Location: [facility];  Service: Urology;  Laterality: Left;   INCISIONAL HERNIA REPAIR N/A 06/01/2019   Procedure: LAPAROSCOPIC INCISIONAL HERNIA REPAIR WITH MESH;  Surgeon: Carolan Shiver, MD;  Location: [facility];  Service: General;  Laterality: N/A;    OB History   No obstetric history on file.      Home Medications    Prior to Admission medications   Medication Sig Start Date End Date Taking? Authorizing Provider  fluconazole (DIFLUCAN) 150 MG tablet Take 1 tablet (150 mg total) by mouth every 3 (three) days for 3 doses. 05/09/23 05/16/23 Yes Becky Augusta, NP  levofloxacin (LEVAQUIN) 500 MG tablet Take 1 tablet (500 mg total) by mouth daily. 05/09/23  Yes Becky Augusta, NP  phenazopyridine (PYRIDIUM) 200 MG tablet Take 1 tablet (200 mg total) by mouth 3 (three) times daily. 05/09/23  Yes Becky Augusta, NP  acetaminophen (TYLENOL) 325 MG tablet Take 325-650 mg by mouth every 6 (six) hours as needed for mild pain or fever.    [provider]  ARMOUR THYROID 60 MG tablet Take 60 mg by mouth daily. 10/04/20   [provider]  atorvastatin (LIPITOR) 40 MG tablet  Take 40 mg by mouth at bedtime.     [provider]  benzonatate (TESSALON) 200 MG capsule Take 1 capsule (200 mg total) by mouth 3 (three) times daily as needed for cough. 09/21/21   Rodriguez-Southworth, Nettie Elm, PA-C  buPROPion (WELLBUTRIN SR) 200 MG 12 hr tablet Take 200 mg by mouth 2 (two) times daily.    [provider]  Cholecalciferol (VITAMIN D3) 125 MCG (5000 UT) CAPS Take 5,000 Units by mouth daily.    [provider]  cyanocobalamin 1000 MCG tablet  Take 1,000 mcg by mouth daily.    [provider]  HYDROcodone-acetaminophen (NORCO/VICODIN) 5-325 MG tablet Take 1 tablet by mouth every 4 (four) hours as needed. 02/04/22   Minna Antis, MD  loratadine (CLARITIN) 10 MG tablet Take 10 mg by mouth daily.    [provider]  losartan (COZAAR) 100 MG tablet Take 100 mg by mouth daily.    [provider]  ondansetron (ZOFRAN-ODT) 4 MG disintegrating tablet Take 1 tablet (4 mg total) by mouth every 8 (eight) hours as needed for nausea or vomiting. 02/04/22   Minna Antis, MD  progesterone (PROMETRIUM) 200 MG capsule Take 200 mg by mouth at bedtime. 10/04/20   [provider]  pyridOXINE (VITAMIN B-6) 50 MG tablet Take 50 mg by mouth 2 (two) times daily.    [provider]  rOPINIRole (REQUIP) 0.25 MG tablet Take 0.25 mg by mouth at bedtime.    [provider]    Family History Family History  Problem Relation Age of Onset   Stroke Mother    Prostate cancer Father    Breast cancer Neg Hx     Social History Social History   Tobacco Use   Smoking status: Never   Smokeless tobacco: Never  Vaping Use   Vaping Use: Never used  Substance Use Topics   Alcohol use: No   Drug use: No     Allergies   Aciphex [rabeprazole], Bactrim [sulfamethoxazole-trimethoprim], Ceftin [cefuroxime axetil], Protonix [pantoprazole], and Vilazodone hcl   Review of Systems Review of Systems  Constitutional:  Negative for fever.  Gastrointestinal:  Positive for nausea.  Genitourinary:  Positive for dysuria, flank pain, frequency and urgency. Negative for hematuria.  Musculoskeletal:  Positive for back pain.     Physical Exam Triage Vital Signs ED Triage Vitals [05/09/23 1641]  Enc Vitals Group     BP      Pulse      Resp      Temp      Temp src      SpO2      Weight      Height      Head Circumference      Peak Flow      Pain Score 3     Pain Loc      Pain Edu?      Excl. in  GC?    No data found.  Updated Vital Signs BP (!) 158/90 (BP Location: Left Arm)   Pulse 80   Temp 98.8 F (37.1 C) (Oral)   Resp 18   SpO2 99%   Visual Acuity Right Eye Distance:   Left Eye Distance:   Bilateral Distance:    Right Eye Near:   Left Eye Near:    Bilateral Near:     Physical Exam Vitals and nursing note reviewed.  Constitutional:      Appearance: Normal appearance. She is not ill-appearing.  HENT:  Head: Normocephalic and atraumatic.  Cardiovascular:     Rate and Rhythm: Normal rate and regular rhythm.     Pulses: Normal pulses.     Heart sounds: Normal heart sounds. No murmur heard.    No friction rub. No gallop.  Pulmonary:     Effort: Pulmonary effort is normal.     Breath sounds: Normal breath sounds. No wheezing, rhonchi or rales.  Abdominal:     General: Abdomen is flat.     Palpations: Abdomen is soft.     Tenderness: There is no abdominal tenderness. There is no right CVA tenderness or left CVA tenderness.  Skin:    General: Skin is warm and dry.     Capillary Refill: Capillary refill takes less than 2 seconds.     Findings: No rash.  Neurological:     General: No focal deficit present.     Mental Status: She is alert and oriented to person, place, and time.      UC Treatments / Results  Labs (all labs ordered are listed, but only abnormal results are displayed) Labs Reviewed  URINALYSIS, W/ REFLEX TO CULTURE (INFECTION SUSPECTED) - Abnormal; Notable for the following components:      Result Value   APPearance HAZY (*)    Hgb urine dipstick TRACE (*)    Nitrite POSITIVE (*)    Leukocytes,Ua MODERATE (*)    Non Squamous Epithelial PRESENT (*)    Bacteria, UA MANY (*)    All other components within normal limits  URINE CULTURE    EKG   Radiology DG Abdomen 1 View  Result Date: 05/09/2023 CLINICAL DATA:  Right flank pain and history of renal calculi. EXAM: ABDOMEN - 1 VIEW COMPARISON:  CT of the abdomen and pelvis on  06/06/2021. Abdominal film on 11/05/2013 FINDINGS: Normal bowel gas pattern without obstruction or ileus. No abnormal calcifications identified overlying the kidneys, ureters or bladder. Stable clips related to prior cholecystectomy and hardware from prior lumbar fusion at L4-5 and L5-S1. Stable hernia mesh in the lower pelvis. IMPRESSION: No acute findings. No evidence of renal or ureteral calculi by x-ray. Electronically Signed   By: Irish Lack M.D.   On: 05/09/2023 17:24    Procedures Procedures (including critical care time)  Medications Ordered in UC Medications - No data to display  Initial Impression / Assessment and Plan / UC Course  I have reviewed the triage vital signs and the nursing notes.  Pertinent labs & imaging results that were available during my care of the patient were reviewed by me and considered in my medical decision making (see chart for details).   Patient is a nontoxic-appearing 62 year old female presenting for evaluation of 3 days worth of right flank pain with some associated nausea and UTI symptoms.  She reports that she became concerned because she noticed that her urine has a foul odor.  She has had a previous bladder tack but does not currently have a urologist as he retired.  She reports that over the past year she has developed urinary incontinence and has to wear a pad all the time because she does not know when she is urinating.  On exam patient's cardiopulmonary exam is benign and she has no CVA tenderness on exam.  Abdomen is soft and nontender to palpation.  I will order urinalysis to look for the presence of UTI I will also order a KUB to evaluate for the presence of possible kidney stone.  Urinalysis shows hazy appearance with  trace hemoglobin, nitrite positive with moderate leukocyte Estrace.  Non-squamous epithelials are present along with 21-50 WBCs, many bacteria, and budding yeast.  Urine will reflex to culture.  Radiology impression of KUB  states no evidence of renal or ureteral calculi.  I will discharge patient home with a diagnosis of urinary tract infection and treat her with Levaquin 500 milligrams once daily for 7 days.  Additionally, because there is budding yeast in her urine, I will treat her with Diflucan 150 mg tablets and have her take 1 tablet now and repeat every 3 days for total of 3 doses.  I am also going to make a referral to urology for further evaluation of her urinary incontinence.   Final Clinical Impressions(s) / UC Diagnoses   Final diagnoses:  Lower urinary tract infectious disease  Vaginal yeast infection     Discharge Instructions      Take the Keflex daily for 7 days with food for treatment of urinary tract infection.  Use the Pyridium every 8 hours as needed for urinary discomfort.  This will turn your urine a bright red-orange.  Increase your oral fluid intake so that you increase your urine production and or flushing your urinary system.  Take an over-the-counter probiotic, such as Culturelle-Align-Activia, 1 hour after each dose of antibiotic to prevent diarrhea or yeast infections from forming.  We will culture urine and change the antibiotics if necessary.  Take the Diflucan tablets for treatment of your yeast infection.  Take 1 tablet now and repeat dosing every 3 days for total of 3 doses.  I have made a referral to [facility] urology to have your urinary incontinence evaluated and to discuss treatment options.  Return for reevaluation, or see your primary care provider, for any new or worsening symptoms.      ED Prescriptions     Medication Sig Dispense Auth. Provider   levofloxacin (LEVAQUIN) 500 MG tablet Take 1 tablet (500 mg total) by mouth daily. 7 tablet Becky Augusta, NP   phenazopyridine (PYRIDIUM) 200 MG tablet Take 1 tablet (200 mg total) by mouth 3 (three) times daily. 6 tablet Becky Augusta, NP   fluconazole (DIFLUCAN) 150 MG tablet Take 1 tablet (150 mg total) by  mouth every 3 (three) days for 3 doses. 3 tablet Becky Augusta, NP      PDMP not reviewed this encounter.   Becky Augusta, NP 05/09/23 1733

## 2023-05-09 NOTE — ED Triage Notes (Signed)
Pt presents with right side abdominal pain x 3 days and foul smelling urine. Pt was told in the past she had a kidney stone on her right side.

## 2023-05-09 NOTE — Discharge Instructions (Addendum)
Take the Keflex daily for 7 days with food for treatment of urinary tract infection.  Use the Pyridium every 8 hours as needed for urinary discomfort.  This will turn your urine a bright red-orange.  Increase your oral fluid intake so that you increase your urine production and or flushing your urinary system.  Take an over-the-counter probiotic, such as Culturelle-Align-Activia, 1 hour after each dose of antibiotic to prevent diarrhea or yeast infections from forming.  We will culture urine and change the antibiotics if necessary.  Take the Diflucan tablets for treatment of your yeast infection.  Take 1 tablet now and repeat dosing every 3 days for total of 3 doses.  I have made a referral to [facility] urology to have your urinary incontinence evaluated and to discuss treatment options.  Return for reevaluation, or see your primary care provider, for any new or worsening symptoms.

## 2023-05-12 LAB — URINE CULTURE: Culture: >=100000 — AB

## 2023-05-15 ENCOUNTER — Telehealth (HOSPITAL_COMMUNITY): Payer: Self-pay | Admitting: Emergency Medicine

## 2023-05-15 MED ORDER — NITROFURANTOIN MONOHYD MACRO 100 MG PO CAPS: 100.0000 mg | ORAL_CAPSULE | Freq: Two times a day (BID) | ORAL | 0 refills | Status: DC

## 2023-05-20 ENCOUNTER — Ambulatory Visit
Admission: EM | Admit: 2023-05-20 | Discharge: 2023-05-20 | Disposition: A | Discharge status: Home / Self Care | Payer: BC Managed Care – PPO | Attending: Physician Assistant | Admitting: Physician Assistant

## 2023-05-20 DIAGNOSIS — R1011 Right upper quadrant pain: Secondary | ICD-10-CM | POA: Diagnosis not present

## 2023-05-20 DIAGNOSIS — R110 Nausea: Secondary | ICD-10-CM | POA: Insufficient documentation

## 2023-05-20 DIAGNOSIS — N390 Urinary tract infection, site not specified: Secondary | ICD-10-CM | POA: Diagnosis not present

## 2023-05-20 LAB — URINALYSIS, W/ REFLEX TO CULTURE (INFECTION SUSPECTED)
Bilirubin Urine: NEGATIVE
Glucose, UA: NEGATIVE mg/dL
Hgb urine dipstick: NEGATIVE
Ketones, ur: NEGATIVE mg/dL
Nitrite: NEGATIVE
Protein, ur: NEGATIVE mg/dL
Specific Gravity, Urine: 1.015 (ref 1.005–1.030)
pH: 7 (ref 5.0–8.0)

## 2023-05-20 MED ORDER — AMOXICILLIN-POT CLAVULANATE 875-125 MG PO TABS: 1.0000 | ORAL_TABLET | Freq: Two times a day (BID) | ORAL | 0 refills | Status: AC

## 2023-05-20 NOTE — Discharge Instructions (Signed)
-  The urine shows white blood cells and bacteria.  This could be a small amount of remaining infection.  Since you are still having some symptoms I sent Augmentin to pharmacy for 1 week. - Increase your rest and fluid intake. - Tylenol and/or Motrin for pain relief if you are able to take those medicines. - If no improvement after this next antibiotic or symptoms were to worsen please follow-up with your PCP as you may need a CT scan to try to identify why you are having the upper abdominal pain. - If at any point you develop a fever or the pain worsens, please go to the emergency department.

## 2023-05-20 NOTE — ED Triage Notes (Signed)
Pt had UC visit 05/23 for flank pain. Has one pill remaining of 2nd abx but does not feel improved. Reports ongoing RUQ pain described as a twinge, nausea, hot flashes, fatigued.

## 2023-05-20 NOTE — ED Provider Notes (Signed)
[facility]    CSN: [id number] Arrival date & time: 05/20/23  1606      History   Chief Complaint Chief Complaint  Patient presents with   Abdominal Pain    HPI Brittany Cain is a 62 y.o. female  presenting for fatigue, right upper quadrant abdominal pain, nausea, intermittent hot flashes and feeling generally unwell for the past couple of weeks.  Patient seen at this clinic on 05/09/2023 for symptoms that began on 05/06/2023.  At her initial visit she also reported foul-smelling urine but denied ever having any dysuria, frequency or urgency.  She was treated for urinary tract infection initially with Levaquin which was then switched to [facility] after culture report returned. Culture showed sensitivity to all antibiotics tested except Cipro. She was advised to start the Macrobid on 05/15/2023.  Patient reports she only has 1 capsule of the Macrobid left.  She says she has not noticed it to improve her symptoms at all.  Continues to deny dysuria, urgency, frequency.  Patient denies fever, fatigue, flank pain, pelvic or lower abdominal pain, vomiting, hematuria, vaginal discharge/itching or odor.   Patient has history of cholecystectomy.  Additional medical history is significant for anxiety, depression, obesity, GERD, kidney stones, hypertension, urosepsis.  Reports she had an x-ray at previous visit which did not show any kidney stones and symptoms do not feel consistent with a kidney stone.  No other complaints.  HPI  Past Medical History:  Diagnosis Date   Anxiety    Chicken pox    Chronic back pain    Depression    GERD (gastroesophageal reflux disease)    History of kidney stones    Hypertension    Obesity    Restless leg syndrome     Patient Active Problem List   Diagnosis Date Noted   Pneumonia due to COVID-19 virus 10/13/2020   Acute respiratory failure due to COVID-19 (HCC) 10/13/2020   Gastroenteritis due to COVID-19 virus 10/13/2020   Obesity, Class  III, BMI 40-49.9 (morbid obesity) (HCC) 10/13/2020   Chronic back pain 10/13/2020   Depression 10/13/2020   Sepsis (HCC) 10/31/2019   Ureterolithiasis 10/31/2019   Essential hypertension 10/31/2019   Hyperglycemia 10/31/2019   Restless leg syndrome    Obesity    Major depressive disorder, recurrent episode, severe (HCC) 04/09/2014   MDD (major depressive disorder), recurrent episode, severe (HCC) 04/08/2014   GAD (generalized anxiety disorder) 03/25/2014    Past Surgical History:  Procedure Laterality Date   ABDOMINAL HYSTERECTOMY     BACK SURGERY     lower back; metal in lower back   CHOLECYSTECTOMY     CYSTOSCOPY/URETEROSCOPY/HOLMIUM LASER/STENT PLACEMENT Left 10/31/2019   Procedure: ,left stent placement,left retrograde pyelogram,cystoscopy;  Surgeon: Orson Ape, MD;  Location: [facility];  Service: Urology;  Laterality: Left;   ESOPHAGOGASTRODUODENOSCOPY (EGD) WITH PROPOFOL N/A 05/24/2017   Procedure: ESOPHAGOGASTRODUODENOSCOPY (EGD) WITH PROPOFOL;  Surgeon: Christena Deem, MD;  Location: [facility];  Service: Endoscopy;  Laterality: N/A;   EXTRACORPOREAL SHOCK WAVE LITHOTRIPSY Left 10/29/2019   Procedure: EXTRACORPOREAL SHOCK WAVE LITHOTRIPSY (ESWL);  Surgeon: Orson Ape, MD;  Location: [facility];  Service: Urology;  Laterality: Left;   INCISIONAL HERNIA REPAIR N/A 06/01/2019   Procedure: LAPAROSCOPIC INCISIONAL HERNIA REPAIR WITH MESH;  Surgeon: Carolan Shiver, MD;  Location: [facility];  Service: General;  Laterality: N/A;    OB History   No obstetric history on file.      Home Medications    Prior  to Admission medications   Medication Sig Start Date End Date Taking? Authorizing Provider  amoxicillin-clavulanate (AUGMENTIN) 875-125 MG tablet Take 1 tablet by mouth every 12 (twelve) hours for 7 days. 05/20/23 05/27/23 Yes Shirlee Latch, PA-C  acetaminophen (TYLENOL) 325 MG tablet Take 325-650 mg by mouth every 6 (six) hours as needed for mild pain or  fever.    [provider]  ARMOUR THYROID 60 MG tablet Take 60 mg by mouth daily. 10/04/20   [provider]  atorvastatin (LIPITOR) 40 MG tablet Take 40 mg by mouth at bedtime.     [provider]  buPROPion (WELLBUTRIN SR) 200 MG 12 hr tablet Take 200 mg by mouth 2 (two) times daily.    [provider]  Cholecalciferol (VITAMIN D3) 125 MCG (5000 UT) CAPS Take 5,000 Units by mouth daily.    [provider]  cyanocobalamin 1000 MCG tablet Take 1,000 mcg by mouth daily.    [provider]  loratadine (CLARITIN) 10 MG tablet Take 10 mg by mouth daily.    [provider]  losartan (COZAAR) 100 MG tablet Take 100 mg by mouth daily.    [provider]  ondansetron (ZOFRAN-ODT) 4 MG disintegrating tablet Take 1 tablet (4 mg total) by mouth every 8 (eight) hours as needed for nausea or vomiting. 02/04/22   Minna Antis, MD  phenazopyridine (PYRIDIUM) 200 MG tablet Take 1 tablet (200 mg total) by mouth 3 (three) times daily. 05/09/23   Becky Augusta, NP  pyridOXINE (VITAMIN B-6) 50 MG tablet Take 50 mg by mouth 2 (two) times daily.    [provider]  rOPINIRole (REQUIP) 0.25 MG tablet Take 0.25 mg by mouth at bedtime.    [provider]    Family History Family History  Problem Relation Age of Onset   Stroke Mother    Prostate cancer Father    Breast cancer Neg Hx     Social History Social History   Tobacco Use   Smoking status: Never   Smokeless tobacco: Never  Vaping Use   Vaping Use: Never used  Substance Use Topics   Alcohol use: No   Drug use: No     Allergies   Aciphex [rabeprazole], Bactrim [sulfamethoxazole-trimethoprim], Ceftin [cefuroxime axetil], Protonix [pantoprazole], and Vilazodone hcl   Review of Systems Review of Systems  Constitutional:  Positive for chills and fatigue. Negative for fever.  Respiratory:  Negative for shortness of breath.   Cardiovascular:  Negative for  chest pain.  Gastrointestinal:  Positive for abdominal pain and nausea. Negative for diarrhea and vomiting.  Genitourinary:  Negative for decreased urine volume, dysuria, flank pain, frequency, hematuria, pelvic pain, urgency, vaginal bleeding, vaginal discharge and vaginal pain.  Musculoskeletal:  Negative for back pain.  Skin:  Negative for rash.  Neurological:  Negative for weakness.     Physical Exam Triage Vital Signs ED Triage Vitals  Enc Vitals Group     BP      Pulse      Resp      Temp      Temp src      SpO2      Weight      Height      Head Circumference      Peak Flow      Pain Score      Pain Loc      Pain Edu?      Excl. in GC?    No data found.  Updated Vital Signs BP (!) 145/79 (BP Location: Left Arm)   Pulse 74   Temp 98.1 F (36.7 C) (Oral)   Resp 20   SpO2 100%    Physical Exam Vitals and nursing note reviewed.  Constitutional:      General: She is not in acute distress.    Appearance: Normal appearance. She is obese. She is not ill-appearing or toxic-appearing.  HENT:     Head: Normocephalic and atraumatic.     Nose: Nose normal.     Mouth/Throat:     Mouth: Mucous membranes are moist.     Pharynx: Oropharynx is clear.  Eyes:     General: No scleral icterus.       Right eye: No discharge.        Left eye: No discharge.     Conjunctiva/sclera: Conjunctivae normal.  Cardiovascular:     Rate and Rhythm: Normal rate and regular rhythm.     Heart sounds: Normal heart sounds.  Pulmonary:     Effort: Pulmonary effort is normal. No respiratory distress.     Breath sounds: Normal breath sounds.  Abdominal:     Palpations: Abdomen is soft.     Tenderness: There is no abdominal tenderness. There is no right CVA tenderness or left CVA tenderness.  Musculoskeletal:     Cervical back: Neck supple.  Skin:    General: Skin is dry.  Neurological:     General: No focal deficit present.     Mental Status: She is alert. Mental status is at  baseline.     Motor: No weakness.     Gait: Gait normal.  Psychiatric:        Mood and Affect: Mood normal.        Behavior: Behavior normal.        Thought Content: Thought content normal.      UC Treatments / Results  Labs (all labs ordered are listed, but only abnormal results are displayed) Labs Reviewed  URINALYSIS, W/ REFLEX TO CULTURE (INFECTION SUSPECTED) - Abnormal; Notable for the following components:      Result Value   Leukocytes,Ua SMALL (*)    Bacteria, UA FEW (*)    All other components within normal limits    EKG   Radiology No results found.  Procedures Procedures (including critical care time)  Medications Ordered in UC Medications - No data to display  Initial Impression / Assessment and Plan / UC Course  I have reviewed the triage vital signs and the nursing notes.  Pertinent labs & imaging results that were available during my care of the patient were reviewed by me and considered in my medical decision making (see chart for details).   62 y/o female returns for continued symptoms of nausea, chills, right upper quadrant abdominal pain and fatigue.  Seen on 5/23 for UTI.  Prescribed Levaquin.  Levaquin discontinued after culture showed it was not sensitive to Cipro.  Patient has been taking Macrobid for the past 5 days.  Patient never had UTI symptoms and still denies dysuria, frequency or urgency.  Vitals are normal and stable and the patient is overall well-appearing.  On exam abdomen is soft and nontender and she has no CVA tenderness.  Chest clear to auscultation.  UA shows small leuks. Will send for culture to ensure patient did not pick up another bacterial infection.  Possible ascending UTI that Macrobid may not cover but low suspicion for this.  Sent Augmentin.  This is what she  typically takes when she has UTIs.  Encouraged increasing rest and fluids.  We also discussed the possibility of a kidney stone not seen on x-ray.  Advise if symptoms  continue then she may need to have CT scan performed.  Advised to make an appointment with PCP.  We discussed going to the emergency department if she develops fever or worsening pain or other worsening symptoms.   Final Clinical Impressions(s) / UC Diagnoses   Final diagnoses:  Urinary tract infection without hematuria, site unspecified  Abdominal pain, right upper quadrant  Nausea without vomiting     Discharge Instructions      -The urine shows white blood cells and bacteria.  This could be a small amount of remaining infection.  Since you are still having some symptoms I sent Augmentin to pharmacy for 1 week. - Increase your rest and fluid intake. - Tylenol and/or Motrin for pain relief if you are able to take those medicines. - If no improvement after this next antibiotic or symptoms were to worsen please follow-up with your PCP as you may need a CT scan to try to identify why you are having the upper abdominal pain. - If at any point you develop a fever or the pain worsens, please go to the emergency department.     ED Prescriptions     Medication Sig Dispense Auth. Provider   amoxicillin-clavulanate (AUGMENTIN) 875-125 MG tablet Take 1 tablet by mouth every 12 (twelve) hours for 7 days. 14 tablet Gareth Morgan      PDMP not reviewed this encounter.   Shirlee Latch, PA-C 05/20/23 1807

## 2023-05-24 IMAGING — CT CT ABD-PEL WO/W CM
2 of 9 series · 12 of 46 positions shown, 18 images · IV contrast (omnipaque)
Comparison: CT October 29, 2021

CLINICAL DATA: Difficulty voiding

EXAM:
CT ABDOMEN AND PELVIS WITHOUT AND WITH CONTRAST
TECHNIQUE: Multidetector CT imaging of the abdomen and pelvis was performed
following the standard protocol before and following the bolus
administration of intravenous contrast.
CONTRAST:  100mL OMNIPAQUE IOHEXOL 300 MG/ML  SOLN

[Series 2: abd pelvis pre 5.00 · axial · non-contrast · 0.72mm/px · z∈[-1452,-1092]mm · 9 of 92 slices shown, 15 images]
[im 10/92  soft-tissue]
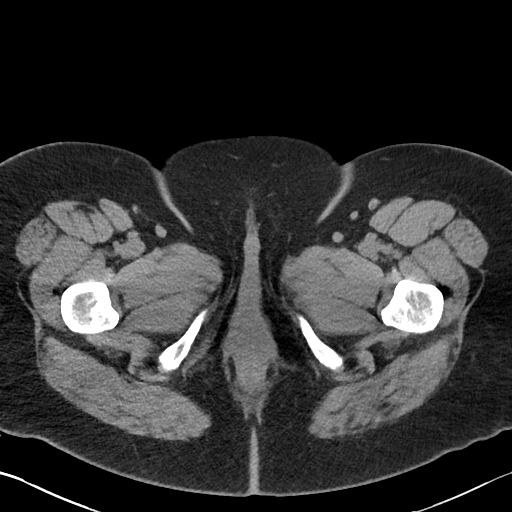
[im 10/92  bone]
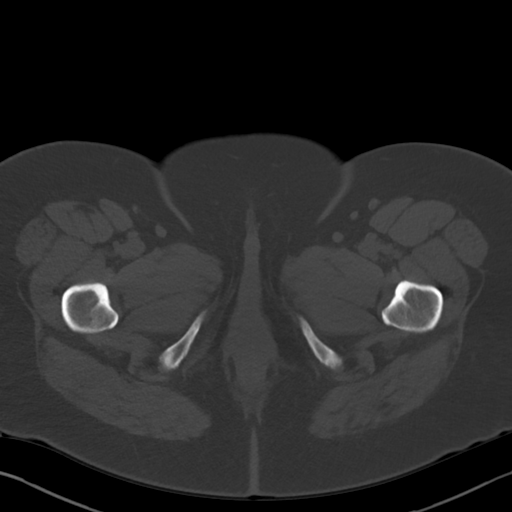
[im 19/92  soft-tissue]
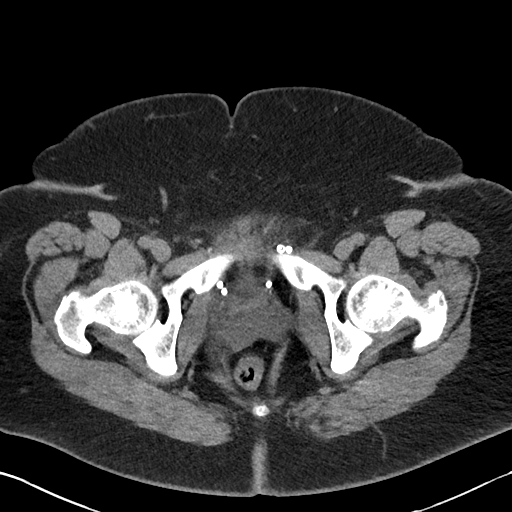
[im 28/92  soft-tissue]
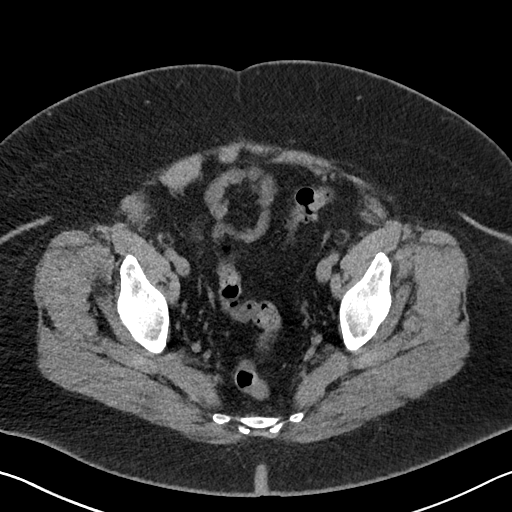
[im 37/92  soft-tissue]
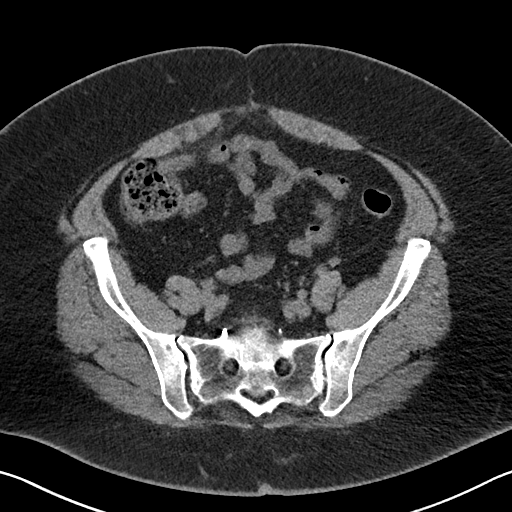
[im 46/92  soft-tissue]
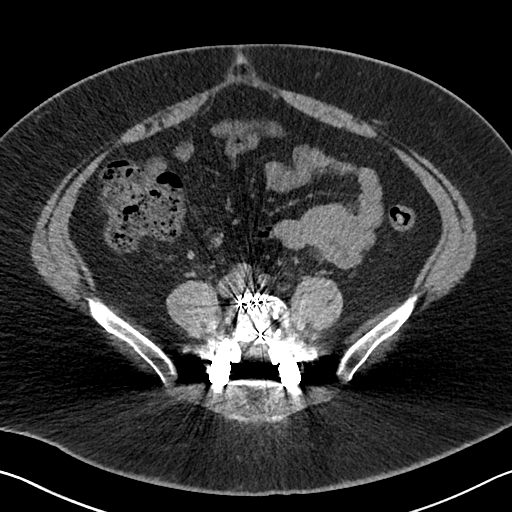
[im 55/92  soft-tissue]
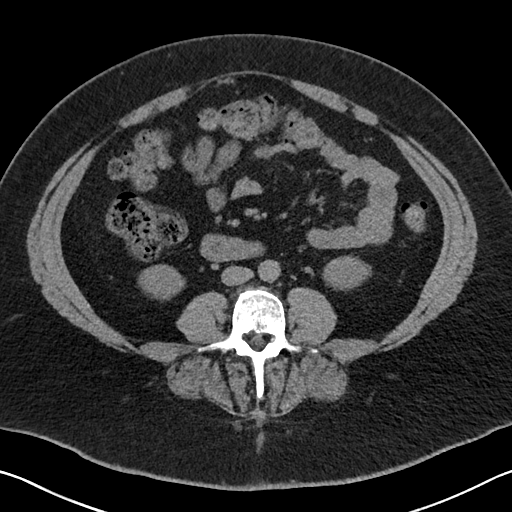
[im 55/92  lung]
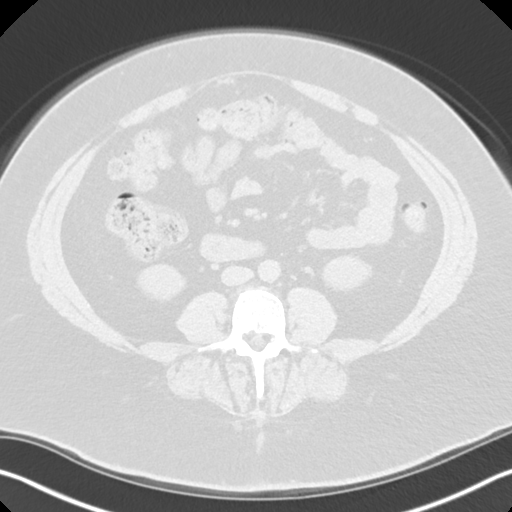
[im 64/92  soft-tissue]
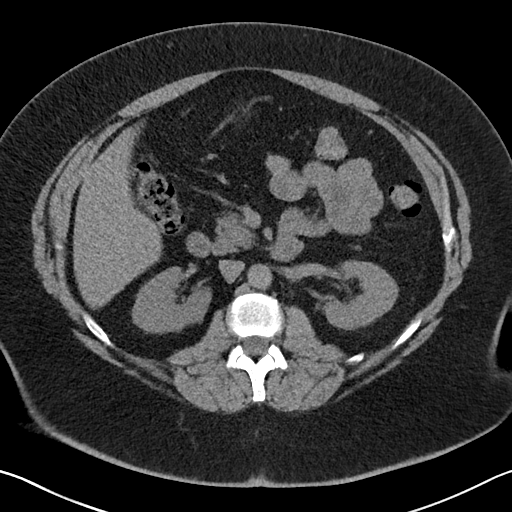
[im 64/92  lung]
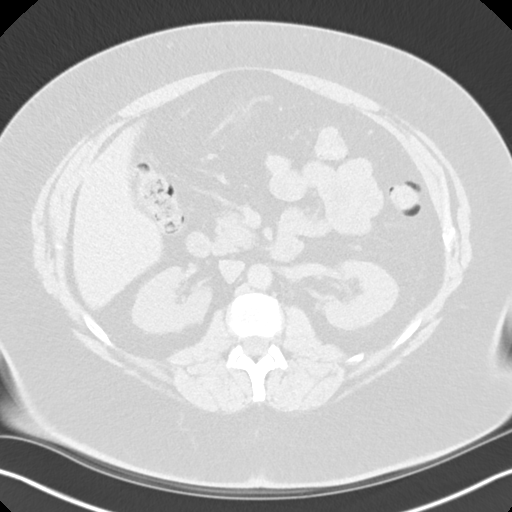
[im 73/92  soft-tissue]
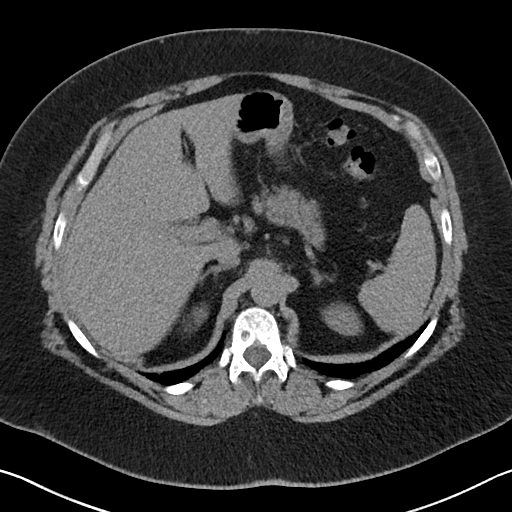
[im 73/92  lung]
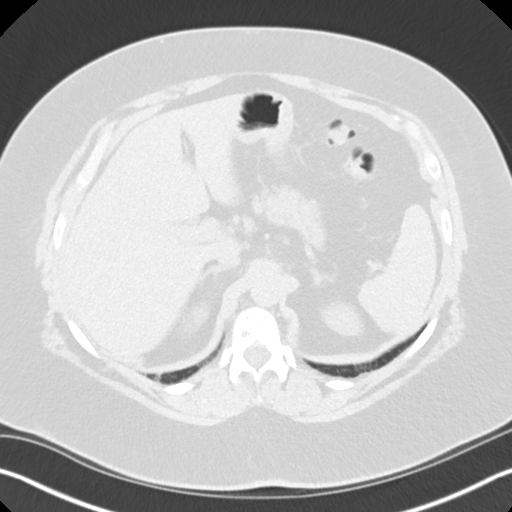
[im 82/92  soft-tissue]
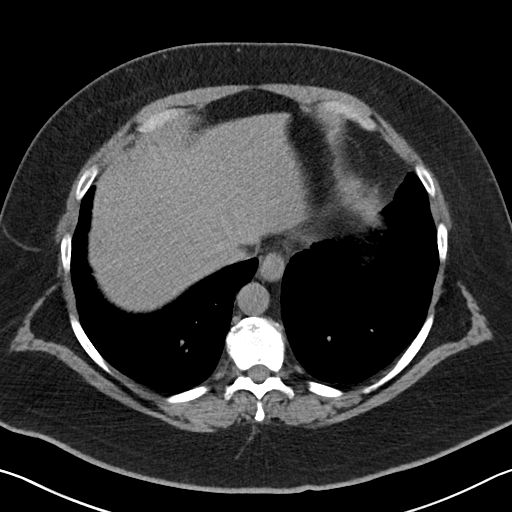
[im 82/92  lung]
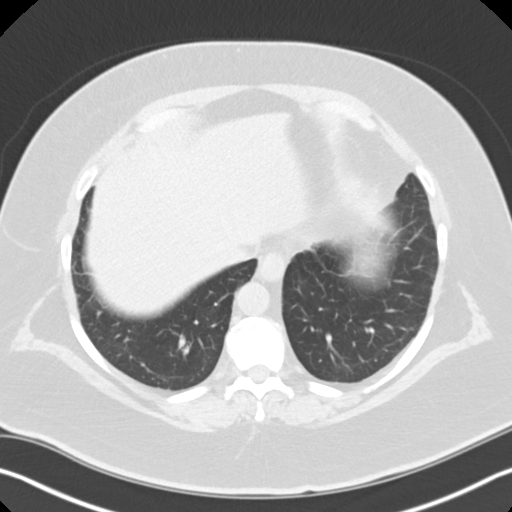
[im 82/92  bone]
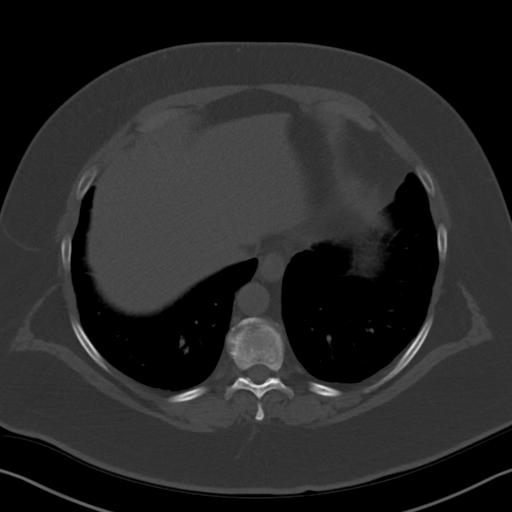

[Series 4: abd pelvis pre 2.00 cor · coronal · non-contrast · 0.72mm/px · 3 of 170 slices shown]
[im 43/170  soft-tissue]
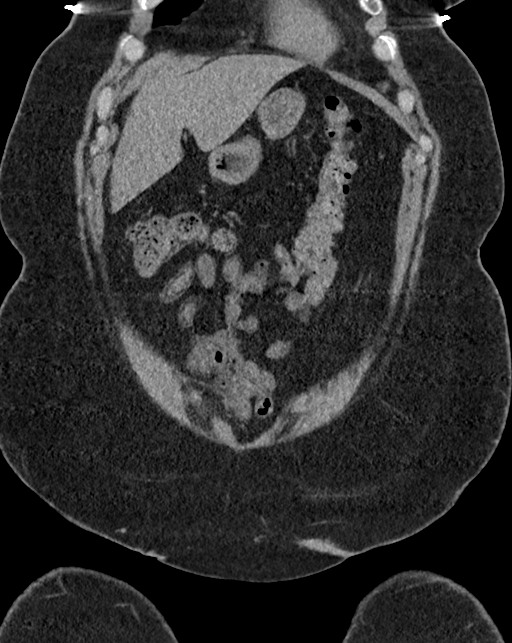
[im 85/170  soft-tissue]
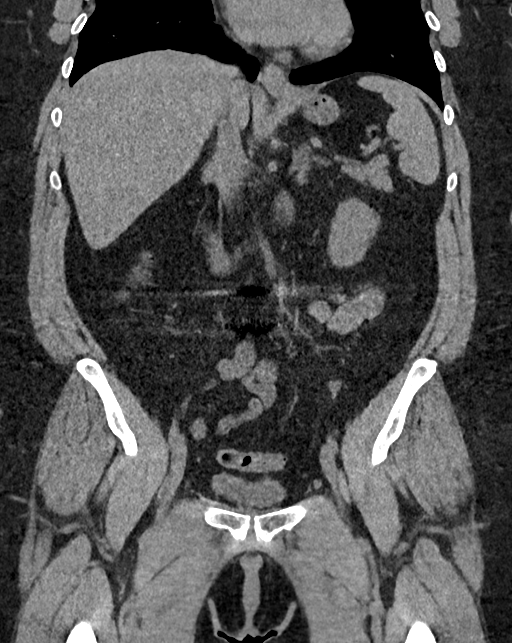
[im 127/170  soft-tissue]
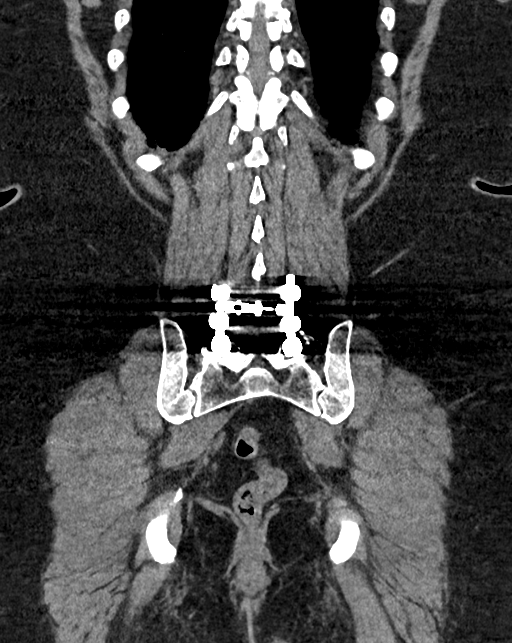

[12 of 46 positions shown; findings below may reference images not displayed]

FINDINGS: Lower chest: No acute abnormality. Normal size heart. No significant
pericardial effusion/thickening.

Hepatobiliary: No suspicious hepatic lesion. Gallbladder surgically
absent. No biliary ductal dilation.

Pancreas: Within normal limits.

Spleen: Within normal limits.

Adrenals/Urinary Tract: Bilateral adrenal glands are unremarkable.

No hydronephrosis. Punctate 2 mm nonobstructive right lower pole
renal stone. No left-sided renal stones. No ureteral or bladder
calculi visualized. Symmetric enhancement and excretion of contrast
in the bilateral kidneys. No suspicious filling defect visualized
within the opacified portions of the collecting system or proximal
ureters on delayed imaging. No solid enhancing renal masses.

Urinary bladder is grossly unremarkable for degree of distension.

Stomach/Bowel: Tiny hiatal hernia otherwise the stomach is grossly
unremarkable. Normal positioning of the duodenum/ligament of Treitz.
No pathologic dilation of small bowel. The appendix and terminal
ileum are grossly unremarkable. Small volume formed stool in the
colon. Left-sided colonic diverticulosis without findings of acute
diverticulitis.

Vascular/Lymphatic: No abdominal aortic aneurysm.

Reproductive: Status post hysterectomy. No adnexal masses.

Other: No abdominopelvic ascites.  Hernia repair appears intact.

Musculoskeletal: L4-S1 posterior spinal fusion and intervertebral
disc spacers. No evidence of hardware loosening or fracture.
Multilevel degenerative changes spine. Degenerative changes
bilateral hips and SI joints. No acute osseous abnormality.
IMPRESSION: 1. Punctate 2 mm nonobstructive right lower pole renal stone.
2. No hydronephrosis. No ureteral or bladder calculi visualized. No
solid enhancing renal masses.
3. Left-sided colonic diverticulosis without findings of acute
diverticulitis.

## 2023-06-02 NOTE — H&P (Signed)
Pre-Procedure H&P   Patient ID: Brittany Cain is a 62 y.o. female.  Gastroenterology Provider: Jaynie Collins, DO  Referring Provider: Dr. Burnadette Pop PCP: Marisue Ivan, MD  Date: 06/03/2023  HPI Ms. Brittany Cain is a 62 y.o. female who presents today for Colonoscopy for Colorectal cancer screening .  Patient last underwent colonoscopy in March 2013 only noting internal hemorrhoids.  She reports daily bowel movement without melena or hematochezia. She is status post C-section cholecystectomy and hysterectomy Hemoglobin 15 MCV 93 platelets 335,000 No family history of colon cancer or colon polyps   Past Medical History:  Diagnosis Date   Anxiety    Chicken pox    Chronic back pain    Depression    GERD (gastroesophageal reflux disease)    History of kidney stones    Hypertension    Obesity    Restless leg syndrome     Past Surgical History:  Procedure Laterality Date   ABDOMINAL HYSTERECTOMY     BACK SURGERY     lower back; metal in lower back   CHOLECYSTECTOMY     CYSTOSCOPY/URETEROSCOPY/HOLMIUM LASER/STENT PLACEMENT Left 10/31/2019   Procedure: ,left stent placement,left retrograde pyelogram,cystoscopy;  Surgeon: Orson Ape, MD;  Location: [facility];  Service: Urology;  Laterality: Left;   ESOPHAGOGASTRODUODENOSCOPY (EGD) WITH PROPOFOL N/A 05/24/2017   Procedure: ESOPHAGOGASTRODUODENOSCOPY (EGD) WITH PROPOFOL;  Surgeon: Christena Deem, MD;  Location: [facility] ENDOSCOPY;  Service: Endoscopy;  Laterality: N/A;   EXTRACORPOREAL SHOCK WAVE LITHOTRIPSY Left 10/29/2019   Procedure: EXTRACORPOREAL SHOCK WAVE LITHOTRIPSY (ESWL);  Surgeon: Orson Ape, MD;  Location: [facility];  Service: Urology;  Laterality: Left;   INCISIONAL HERNIA REPAIR N/A 06/01/2019   Procedure: LAPAROSCOPIC INCISIONAL HERNIA REPAIR WITH MESH;  Surgeon: Carolan Shiver, MD;  Location: [facility];  Service: General;  Laterality: N/A;    Family History No h/o GI disease or  malignancy  Review of Systems  Constitutional:  Negative for activity change, appetite change, chills, diaphoresis, fatigue, fever and unexpected weight change.  HENT:  Negative for trouble swallowing and voice change.   Respiratory:  Negative for shortness of breath and wheezing.   Cardiovascular:  Negative for chest pain, palpitations and leg swelling.  Gastrointestinal:  Negative for abdominal distention, abdominal pain, anal bleeding, blood in stool, constipation, diarrhea, nausea, rectal pain and vomiting.  Musculoskeletal:  Negative for arthralgias and myalgias.  Skin:  Negative for color change and pallor.  Neurological:  Negative for dizziness, syncope and weakness.  Psychiatric/Behavioral:  Negative for confusion.   All other systems reviewed and are negative.    Medications No current facility-administered medications on file prior to encounter.   Current Outpatient Medications on File Prior to Encounter  Medication Sig Dispense Refill   losartan (COZAAR) 100 MG tablet Take 100 mg by mouth daily.     acetaminophen (TYLENOL) 325 MG tablet Take 325-650 mg by mouth every 6 (six) hours as needed for mild pain or fever.     ARMOUR THYROID 60 MG tablet Take 60 mg by mouth daily.     atorvastatin (LIPITOR) 40 MG tablet Take 40 mg by mouth at bedtime.      buPROPion (WELLBUTRIN SR) 200 MG 12 hr tablet Take 200 mg by mouth 2 (two) times daily.     Cholecalciferol (VITAMIN D3) 125 MCG (5000 UT) CAPS Take 5,000 Units by mouth daily.     cyanocobalamin 1000 MCG tablet Take 1,000 mcg by mouth daily.     loratadine (CLARITIN)  10 MG tablet Take 10 mg by mouth daily.     ondansetron (ZOFRAN-ODT) 4 MG disintegrating tablet Take 1 tablet (4 mg total) by mouth every 8 (eight) hours as needed for nausea or vomiting. 20 tablet 0   pyridOXINE (VITAMIN B-6) 50 MG tablet Take 50 mg by mouth 2 (two) times daily.     rOPINIRole (REQUIP) 0.25 MG tablet Take 0.25 mg by mouth at bedtime.      Pertinent  medications related to GI and procedure were reviewed by me with the patient prior to the procedure   Current Facility-Administered Medications:    0.9 %  sodium chloride infusion, , Intravenous, Continuous, Jaynie Collins, DO, Last Rate: 20 mL/hr at 06/03/23 1349, New Bag at 06/03/23 1349  sodium chloride 20 mL/hr at 06/03/23 1349       Allergies  Allergen Reactions   Aciphex [Rabeprazole] Swelling   Bactrim [Sulfamethoxazole-Trimethoprim] Diarrhea and Nausea Only   Ceftin [Cefuroxime Axetil] Hives   Protonix [Pantoprazole] Hives and Swelling   Vilazodone Hcl Hives and Other (See Comments)    Tongue swelling   Allergies were reviewed by me prior to the procedure  Objective   Body mass index is 39.82 kg/m. Vitals:   06/03/23 1328  BP: (!) 152/103  Pulse: 78  Resp: 18  Temp: (!) 97.1 F (36.2 C)  TempSrc: Temporal  SpO2: 100%  Weight: 102 kg  Height: 5\' 3"  (1.6 m)     Physical Exam Vitals and nursing note reviewed.  Constitutional:      General: She is not in acute distress.    Appearance: Normal appearance. She is obese. She is not ill-appearing, toxic-appearing or diaphoretic.  HENT:     Head: Normocephalic and atraumatic.     Nose: Nose normal.     Mouth/Throat:     Mouth: Mucous membranes are moist.     Pharynx: Oropharynx is clear.  Eyes:     General: No scleral icterus.    Extraocular Movements: Extraocular movements intact.  Cardiovascular:     Rate and Rhythm: Normal rate and regular rhythm.     Heart sounds: Normal heart sounds. No murmur heard.    No friction rub. No gallop.  Pulmonary:     Effort: Pulmonary effort is normal. No respiratory distress.     Breath sounds: Normal breath sounds. No wheezing, rhonchi or rales.  Abdominal:     General: Bowel sounds are normal. There is no distension.     Palpations: Abdomen is soft.     Tenderness: There is no abdominal tenderness. There is no guarding or rebound.  Musculoskeletal:     Cervical  back: Neck supple.     Right lower leg: No edema.     Left lower leg: No edema.  Skin:    General: Skin is warm and dry.     Coloration: Skin is not jaundiced or pale.  Neurological:     General: No focal deficit present.     Mental Status: She is alert and oriented to person, place, and time. Mental status is at baseline.  Psychiatric:        Mood and Affect: Mood normal.        Behavior: Behavior normal.        Thought Content: Thought content normal.        Judgment: Judgment normal.      Assessment:  Ms. Brittany Cain is a 63 y.o. female  who presents today for Colonoscopy for Colorectal cancer  screening .  Plan:  Colonoscopy with possible intervention today  Colonoscopy with possible biopsy, control of bleeding, polypectomy, and interventions as necessary has been discussed with the patient/patient representative. Informed consent was obtained from the patient/patient representative after explaining the indication, nature, and risks of the procedure including but not limited to death, bleeding, perforation, missed neoplasm/lesions, cardiorespiratory compromise, and reaction to medications. Opportunity for questions was given and appropriate answers were provided. Patient/patient representative has verbalized understanding is amenable to undergoing the procedure.   Jaynie Collins, DO  [facility]  Portions of the record may have been created with voice recognition software. Occasional wrong-word or 'sound-a-like' substitutions may have occurred due to the inherent limitations of voice recognition software.  Read the chart carefully and recognize, using context, where substitutions may have occurred.

## 2023-06-03 ENCOUNTER — Encounter
Admission: RE | Disposition: A | Discharge status: Home / Self Care | Payer: Self-pay | Source: Home / Self Care | Attending: Gastroenterology

## 2023-06-03 ENCOUNTER — Ambulatory Visit
Admission: RE | Admit: 2023-06-03 | Discharge: 2023-06-03 | Disposition: A | Discharge status: Home / Self Care | Payer: BC Managed Care – PPO | Attending: Gastroenterology | Admitting: Gastroenterology

## 2023-06-03 ENCOUNTER — Encounter: Payer: Self-pay | Admitting: Gastroenterology

## 2023-06-03 ENCOUNTER — Ambulatory Visit: Payer: BC Managed Care – PPO | Admitting: Registered Nurse

## 2023-06-03 DIAGNOSIS — K649 Unspecified hemorrhoids: Secondary | ICD-10-CM | POA: Diagnosis not present

## 2023-06-03 DIAGNOSIS — Z9049 Acquired absence of other specified parts of digestive tract: Secondary | ICD-10-CM | POA: Insufficient documentation

## 2023-06-03 DIAGNOSIS — Z90710 Acquired absence of both cervix and uterus: Secondary | ICD-10-CM | POA: Diagnosis not present

## 2023-06-03 DIAGNOSIS — D122 Benign neoplasm of ascending colon: Secondary | ICD-10-CM | POA: Insufficient documentation

## 2023-06-03 DIAGNOSIS — K640 First degree hemorrhoids: Secondary | ICD-10-CM | POA: Insufficient documentation

## 2023-06-03 DIAGNOSIS — I10 Essential (primary) hypertension: Secondary | ICD-10-CM | POA: Diagnosis not present

## 2023-06-03 DIAGNOSIS — K635 Polyp of colon: Secondary | ICD-10-CM | POA: Diagnosis not present

## 2023-06-03 DIAGNOSIS — Z1211 Encounter for screening for malignant neoplasm of colon: Secondary | ICD-10-CM | POA: Insufficient documentation

## 2023-06-03 HISTORY — PX: COLONOSCOPY WITH PROPOFOL: SHX5780

## 2023-06-03 HISTORY — PX: POLYPECTOMY: SHX5525

## 2023-06-03 SURGERY — COLONOSCOPY WITH PROPOFOL
Anesthesia: General

## 2023-06-03 MED ORDER — STERILE WATER FOR IRRIGATION IR SOLN
Status: DC | PRN
  Administered 2023-06-03: 120 mL

## 2023-06-03 MED ORDER — LIDOCAINE HCL (CARDIAC) PF 100 MG/5ML IV SOSY
PREFILLED_SYRINGE | INTRAVENOUS | Status: DC | PRN
  Administered 2023-06-03: 100 mg via INTRAVENOUS

## 2023-06-03 MED ORDER — PROPOFOL 500 MG/50ML IV EMUL
INTRAVENOUS | Status: DC | PRN
  Administered 2023-06-03: 189.542 ug/kg/min via INTRAVENOUS

## 2023-06-03 MED ORDER — PROPOFOL 10 MG/ML IV BOLUS
INTRAVENOUS | Status: DC | PRN
  Administered 2023-06-03: 100 mg via INTRAVENOUS
  Administered 2023-06-03: 30 mg via INTRAVENOUS

## 2023-06-03 MED ORDER — SODIUM CHLORIDE 0.9 % IV SOLN: INTRAVENOUS | Status: DC

## 2023-06-03 MED ORDER — PHENYLEPHRINE HCL (PRESSORS) 10 MG/ML IV SOLN
INTRAVENOUS | Status: DC | PRN
  Administered 2023-06-03: 80 ug via INTRAVENOUS
  Administered 2023-06-03: 160 ug via INTRAVENOUS
  Administered 2023-06-03: 120 ug via INTRAVENOUS

## 2023-06-03 NOTE — Op Note (Signed)
[facility] Gastroenterology Patient Name: Brittany Cain Procedure Date: 06/03/2023 2:57 PM MRN: [id number] Account #: [id number] Date of Birth: 06-02-1961 Admit Type: Outpatient Age: 62 Room: [facility] ENDO ROOM 2 Gender: Female Note Status: Finalized Instrument Name: Colonoscope [id number] Procedure:             Colonoscopy Indications:           Screening for colorectal malignant neoplasm Providers:             Jaynie Collins DO, DO Referring MD:          Marisue Ivan (Referring MD) Medicines:             Monitored Anesthesia Care Complications:         No immediate complications. Estimated blood loss:                         Minimal. Procedure:             Pre-Anesthesia Assessment:                        - Prior to the procedure, a History and Physical was                         performed, and patient medications and allergies were                         reviewed. The patient is competent. The risks and                         benefits of the procedure and the sedation options and                         risks were discussed with the patient. All questions                         were answered and informed consent was obtained.                         Patient identification and proposed procedure were                         verified by the physician, the nurse, the anesthetist                         and the technician in the endoscopy suite. Mental                         Status Examination: alert and oriented. Airway                         Examination: normal oropharyngeal airway and neck                         mobility. Respiratory Examination: clear to                         auscultation. CV Examination: RRR, no murmurs, no S3  or S4. Prophylactic Antibiotics: The patient does not                         require prophylactic antibiotics. Prior                         Anticoagulants: The patient has taken no anticoagulant                          or antiplatelet agents. ASA Grade Assessment: III - A                         patient with severe systemic disease. After reviewing                         the risks and benefits, the patient was deemed in                         satisfactory condition to undergo the procedure. The                         anesthesia plan was to use monitored anesthesia care                         (MAC). Immediately prior to administration of                         medications, the patient was re-assessed for adequacy                         to receive sedatives. The heart rate, respiratory                         rate, oxygen saturations, blood pressure, adequacy of                         pulmonary ventilation, and response to care were                         monitored throughout the procedure. The physical                         status of the patient was re-assessed after the                         procedure.                        After obtaining informed consent, the colonoscope was                         passed under direct vision. Throughout the procedure,                         the patient's blood pressure, pulse, and oxygen                         saturations were monitored continuously. The  Colonoscope was introduced through the anus and                         advanced to the the terminal ileum, with                         identification of the appendiceal orifice and IC                         valve. The colonoscopy was performed without                         difficulty. The patient tolerated the procedure well.                         The quality of the bowel preparation was evaluated                         using the BBPS [facility] Bowel Preparation Scale) with                         scores of: Right Colon = 1 (portion of mucosa seen,                         but other areas not well seen due to staining,                         residual  stool and/or opaque liquid), Transverse Colon                         = 1 (portion of mucosa seen, but other areas not well                         seen due to staining, residual stool and/or opaque                         liquid) and Left Colon = 1 (portion of mucosa seen,                         but other areas not well seen due to staining,                         residual stool and/or opaque liquid). The total BBPS                         score equals 3. The quality of the bowel preparation                         was inadequate. Findings:      The perianal and digital rectal examinations were normal. Pertinent       negatives include normal sphincter tone.      The terminal ileum appeared normal. Estimated blood loss: none.      Retroflexion in the right colon was performed.      Two sessile polyps were found in the ascending colon. The polyps were 1       to 2 mm in size. These  polyps were removed with a jumbo cold forceps.       Resection and retrieval were complete. Estimated blood loss was minimal.      Stool adherent to wall was difficult to lavage and achieve a full view       of the mucosa. No large lesion. Small to medium sized lesions could not       be ruled out.      Non-bleeding internal hemorrhoids were found during retroflexion. The       hemorrhoids were Grade I (internal hemorrhoids that do not prolapse).       Estimated blood loss: none.      The exam was otherwise without abnormality on direct and retroflexion       views. Impression:            - Preparation of the colon was inadequate.                        - The examined portion of the ileum was normal.                        - Two 1 to 2 mm polyps in the ascending colon, removed                         with a jumbo cold forceps. Resected and retrieved.                        - Non-bleeding internal hemorrhoids.                        - The examination was otherwise normal on direct and                          retroflexion views. Recommendation:        - Patient has a contact number available for                         emergencies. The signs and symptoms of potential                         delayed complications were discussed with the patient.                         Return to normal activities tomorrow. Written                         discharge instructions were provided to the patient.                        - Discharge patient to home.                        - Resume previous diet.                        - Continue present medications.                        - Await pathology results.                        -  Repeat colonoscopy 6-12 months because the bowel                         preparation was poor.                        - Return to GI office at appointment to be scheduled.                        - The findings and recommendations were discussed with                         the patient. Procedure Code(s):     --- Professional ---                        [id number], Colonoscopy, flexible; with biopsy, single or                         multiple Diagnosis Code(s):     --- Professional ---                        Z12.11, Encounter for screening for malignant neoplasm                         of colon                        K64.0, First degree hemorrhoids                        D12.2, Benign neoplasm of ascending colon CPT copyright 2022 American Medical Association. All rights reserved. The codes documented in this report are preliminary and upon coder review may  be revised to meet current compliance requirements. Attending Participation:      I personally performed the entire procedure. Elfredia Nevins, DO Jaynie Collins DO, DO 06/03/2023 3:26:02 PM This report has been signed electronically. Number of Addenda: 0 Note Initiated On: 06/03/2023 2:57 PM Scope Withdrawal Time: 0 hours 15 minutes 9 seconds  Total Procedure Duration: 0 hours 17 minutes 29 seconds  Estimated Blood Loss:   Estimated blood loss was minimal.      [facility]

## 2023-06-03 NOTE — Transfer of Care (Signed)
Immediate Anesthesia Transfer of Care Note  Patient: Brittany Cain  Procedure(s) Performed: COLONOSCOPY WITH PROPOFOL  Patient Location: Endoscopy Unit  Anesthesia Type:General  Level of Consciousness: drowsy  Airway & Oxygen Therapy: Patient Spontanous Breathing  Post-op Assessment: Report given to RN and Post -op Vital signs reviewed and stable  Post vital signs: Reviewed and stable  Last Vitals: see EPIC Flowsheet data Vitals Value Taken Time  BP    Temp    Pulse    Resp    SpO2      Last Pain:  Vitals:   06/03/23 1328  TempSrc: Temporal  PainSc: 0-No pain         Complications: No notable events documented.

## 2023-06-03 NOTE — Anesthesia Preprocedure Evaluation (Signed)
Anesthesia Evaluation  Patient identified by MRN, date of birth, ID band Patient awake    Reviewed: Allergy & Precautions, H&P , NPO status , Patient's Chart, lab work & pertinent test results  History of Anesthesia Complications Negative for: history of anesthetic complications  Airway Mallampati: III  TM Distance: >3 FB Neck ROM: full    Dental  (+) Dental Advidsory Given   Pulmonary neg pulmonary ROS, neg shortness of breath, neg COPD   Pulmonary exam normal        Cardiovascular Exercise Tolerance: Good hypertension, (-) angina (-) Past MI and (-) DOE negative cardio ROS Normal cardiovascular exam     Neuro/Psych  PSYCHIATRIC DISORDERS Anxiety Depression    negative neurological ROS     GI/Hepatic Neg liver ROS,GERD  Medicated and Controlled,,  Endo/Other    Morbid obesity  Renal/GU negative Renal ROS  negative genitourinary   Musculoskeletal   Abdominal   Peds  Hematology negative hematology ROS (+)   Anesthesia Other Findings Past Medical History: No date: Anxiety No date: Chicken pox No date: Chronic back pain No date: Depression No date: GERD (gastroesophageal reflux disease) No date: History of kidney stones No date: Hypertension No date: Obesity No date: Restless leg syndrome  Past Surgical History: No date: ABDOMINAL HYSTERECTOMY No date: BACK SURGERY     Comment:  lower back; metal in lower back No date: CHOLECYSTECTOMY 10/31/2019: CYSTOSCOPY/URETEROSCOPY/HOLMIUM LASER/STENT PLACEMENT;  Left     Comment:  Procedure: ,left stent placement,left retrograde               pyelogram,cystoscopy;  Surgeon: Orson Ape, MD;                Location: [facility] ORS;  Service: Urology;  Laterality: Left; 05/24/2017: ESOPHAGOGASTRODUODENOSCOPY (EGD) WITH PROPOFOL; N/A     Comment:  Procedure: ESOPHAGOGASTRODUODENOSCOPY (EGD) WITH               PROPOFOL;  Surgeon: Christena Deem, MD;  Location:                [facility] ENDOSCOPY;  Service: Endoscopy;  Laterality: N/A; 10/29/2019: EXTRACORPOREAL SHOCK WAVE LITHOTRIPSY; Left     Comment:  Procedure: EXTRACORPOREAL SHOCK WAVE LITHOTRIPSY (ESWL);              Surgeon: Orson Ape, MD;  Location: [facility] ORS;                Service: Urology;  Laterality: Left; 06/01/2019: INCISIONAL HERNIA REPAIR; N/A     Comment:  Procedure: LAPAROSCOPIC INCISIONAL HERNIA REPAIR WITH               MESH;  Surgeon: Carolan Shiver, MD;  Location:               [facility] ORS;  Service: General;  Laterality: N/A;  BMI    Body Mass Index: 39.82 kg/m      Reproductive/Obstetrics negative OB ROS                             Anesthesia Physical Anesthesia Plan  ASA: 3 and emergent  Anesthesia Plan: General   Post-op Pain Management: Minimal or no pain anticipated   Induction: Intravenous  PONV Risk Score and Plan: 3 and Propofol infusion, TIVA and Ondansetron  Airway Management Planned: Nasal Cannula  Additional Equipment: None  Intra-op Plan:   Post-operative Plan: Extubation in OR  Informed Consent: I have reviewed the patients  History and Physical, chart, labs and discussed the procedure including the risks, benefits and alternatives for the proposed anesthesia with the patient or authorized representative who has indicated his/her understanding and acceptance.     Dental advisory given  Plan Discussed with: CRNA and Surgeon  Anesthesia Plan Comments: (Discussed risks of anesthesia with patient, including possibility of difficulty with spontaneous ventilation under anesthesia necessitating airway intervention, PONV, and rare risks such as cardiac or respiratory or neurological events, and allergic reactions. Discussed the role of CRNA in patient's perioperative care. Patient understands.)        Anesthesia Quick Evaluation

## 2023-06-03 NOTE — Interval H&P Note (Signed)
History and Physical Interval Note: Preprocedure H&P from 06/03/23  was reviewed and there was no interval change after seeing and examining the patient.  Written consent was obtained from the patient after discussion of risks, benefits, and alternatives. Patient has consented to proceed with Colonoscopy with possible intervention   06/03/2023 2:51 PM  Brittany Cain  has presented today for surgery, with the diagnosis of colon cancer screening.  The various methods of treatment have been discussed with the patient and family. After consideration of risks, benefits and other options for treatment, the patient has consented to  Procedure(s): COLONOSCOPY WITH PROPOFOL (N/A) as a surgical intervention.  The patient's history has been reviewed, patient examined, no change in status, stable for surgery.  I have reviewed the patient's chart and labs.  Questions were answered to the patient's satisfaction.     Jaynie Collins

## 2023-06-04 ENCOUNTER — Encounter: Payer: Self-pay | Admitting: Gastroenterology

## 2023-06-04 NOTE — Anesthesia Postprocedure Evaluation (Signed)
Anesthesia Post Note  Patient: BERNECE CASSADAY  Procedure(s) Performed: COLONOSCOPY WITH PROPOFOL  Patient location during evaluation: PACU Anesthesia Type: General Level of consciousness: awake and alert Pain management: pain level controlled Vital Signs Assessment: post-procedure vital signs reviewed and stable Respiratory status: spontaneous breathing, nonlabored ventilation, respiratory function stable and patient connected to nasal cannula oxygen Cardiovascular status: blood pressure returned to baseline and stable Postop Assessment: no apparent nausea or vomiting Anesthetic complications: no   No notable events documented.   Last Vitals:  Vitals:   06/03/23 1533 06/03/23 1544  BP: 100/72 133/65  Pulse: 72 60  Resp: 20 16  Temp:    SpO2:      Last Pain:  Vitals:   06/03/23 1544  TempSrc:   PainSc: 0-No pain                 Yevette Edwards

## 2023-06-06 ENCOUNTER — Encounter: Payer: Self-pay | Admitting: Gastroenterology

## 2023-06-13 DIAGNOSIS — I10 Essential (primary) hypertension: Secondary | ICD-10-CM | POA: Diagnosis not present

## 2023-06-13 DIAGNOSIS — F419 Anxiety disorder, unspecified: Secondary | ICD-10-CM | POA: Diagnosis not present

## 2023-06-13 DIAGNOSIS — N390 Urinary tract infection, site not specified: Secondary | ICD-10-CM | POA: Diagnosis not present

## 2023-06-13 DIAGNOSIS — R300 Dysuria: Secondary | ICD-10-CM | POA: Diagnosis not present

## 2023-07-05 ENCOUNTER — Ambulatory Visit: Payer: BC Managed Care – PPO | Admitting: Urology

## 2023-07-05 ENCOUNTER — Encounter: Payer: Self-pay | Admitting: Urology

## 2023-07-05 VITALS — BP 132/80 | HR 74 | Ht 63.0 in | Wt 222.0 lb

## 2023-07-05 DIAGNOSIS — Z87440 Personal history of urinary (tract) infections: Secondary | ICD-10-CM

## 2023-07-05 DIAGNOSIS — N200 Calculus of kidney: Secondary | ICD-10-CM | POA: Diagnosis not present

## 2023-07-05 DIAGNOSIS — R32 Unspecified urinary incontinence: Secondary | ICD-10-CM

## 2023-07-05 DIAGNOSIS — N390 Urinary tract infection, site not specified: Secondary | ICD-10-CM

## 2023-07-05 LAB — URINALYSIS, COMPLETE
Bilirubin, UA: NEGATIVE
Glucose, UA: NEGATIVE
Ketones, UA: NEGATIVE
Nitrite, UA: NEGATIVE
Protein,UA: NEGATIVE
RBC, UA: NEGATIVE
Specific Gravity, UA: 1.02 (ref 1.005–1.030)
Urobilinogen, Ur: 0.2 mg/dL (ref 0.2–1.0)
pH, UA: 6.5 (ref 5.0–7.5)

## 2023-07-05 LAB — MICROSCOPIC EXAMINATION
Bacteria, UA: NONE SEEN
RBC, Urine: NONE SEEN /hpf (ref 0–2)

## 2023-07-05 LAB — BLADDER SCAN AMB NON-IMAGING: Scan Result: 163

## 2023-07-05 MED ORDER — TRIMETHOPRIM 100 MG PO TABS
100.0000 mg | ORAL_TABLET | Freq: Every day | ORAL | 2 refills | Status: DC
Start: 2023-07-05 — End: 2023-07-05

## 2023-07-05 MED ORDER — TRIMETHOPRIM 100 MG PO TABS
100.0000 mg | ORAL_TABLET | Freq: Every day | ORAL | 2 refills | Status: DC
Start: 2023-07-05 — End: 2024-06-04

## 2023-07-05 MED ORDER — GEMTESA 75 MG PO TABS
75.0000 mg | ORAL_TABLET | Freq: Every day | ORAL | 0 refills | Status: DC
Start: 2023-07-05 — End: 2023-07-05

## 2023-07-05 NOTE — Progress Notes (Signed)
I, Brittany Cain,acting as a scribe for Brittany Altes, MD.,have documented all relevant documentation on the behalf of Brittany Altes, MD,as directed by  Brittany Altes, MD while in the presence of Brittany Altes, MD.  07/05/2023 1:58 PM   Brittany Cain 06/22/1961 [id number]  Referring provider: Becky Augusta, NP [address] [place],  [place] [address]  Chief Complaint  Patient presents with   Recurrent UTI    HPI: Brittany Cain is a 62 y.o. female referred for evaluation of recurrent UTI.   Urgent care visit 05/09/23 with complaints of right flank pain, dysuria, malodorous urine, and urinary frequency/urgency. Urinalysis was nitrite positive with pyuria and urine culture grew E. coli. She was discharged on Keflex with symptom improvement PCP visit 06/13/23 complaining of persistent dysuria, frequency, and urgency. UA at that visit showed mild pyuria and urine culture positive for E. coli resistant only to quinolones. Prior history of stone disease treated with lithotripsy by Dr. Evelene Croon November 2020 CT abdomen pelvis in 2022 showed a punctate 2 mm right lower pole calculus. KUB performed 05/09/23 showed a moderate amount of stool and bowel gas over line obscuring the renal outlines.  Since her last antibiotic therapy, her symptoms have resolved.  For the last several years, she estimates she has treated 4-5 UTIs per year. Her right flank pain has resolved. She also complains of constant urinary leakage not associated with coughing, laughing, sneezing, or urge.    PMH: Past Medical History:  Diagnosis Date   Anxiety    Chicken pox    Chronic back pain    Depression    GERD (gastroesophageal reflux disease)    History of kidney stones    Hypertension    Obesity    Restless leg syndrome     Surgical History: Past Surgical History:  Procedure Laterality Date   ABDOMINAL HYSTERECTOMY     BACK SURGERY     lower back; metal in lower back    CHOLECYSTECTOMY     COLONOSCOPY WITH PROPOFOL N/A 06/03/2023   Procedure: COLONOSCOPY WITH PROPOFOL;  Surgeon: Jaynie Collins, DO;  Location: [facility] ENDOSCOPY;  Service: Gastroenterology;  Laterality: N/A;   CYSTOSCOPY/URETEROSCOPY/HOLMIUM LASER/STENT PLACEMENT Left 10/31/2019   Procedure: ,left stent placement,left retrograde pyelogram,cystoscopy;  Surgeon: Orson Ape, MD;  Location: [facility] ORS;  Service: Urology;  Laterality: Left;   ESOPHAGOGASTRODUODENOSCOPY (EGD) WITH PROPOFOL N/A 05/24/2017   Procedure: ESOPHAGOGASTRODUODENOSCOPY (EGD) WITH PROPOFOL;  Surgeon: Christena Deem, MD;  Location: [facility] ENDOSCOPY;  Service: Endoscopy;  Laterality: N/A;   EXTRACORPOREAL SHOCK WAVE LITHOTRIPSY Left 10/29/2019   Procedure: EXTRACORPOREAL SHOCK WAVE LITHOTRIPSY (ESWL);  Surgeon: Orson Ape, MD;  Location: [facility] ORS;  Service: Urology;  Laterality: Left;   INCISIONAL HERNIA REPAIR N/A 06/01/2019   Procedure: LAPAROSCOPIC INCISIONAL HERNIA REPAIR WITH MESH;  Surgeon: Carolan Shiver, MD;  Location: [facility] ORS;  Service: General;  Laterality: N/A;   POLYPECTOMY  06/03/2023   Procedure: POLYPECTOMY;  Surgeon: Jaynie Collins, DO;  Location: [facility] ENDOSCOPY;  Service: Gastroenterology;;    Home Medications:  Allergies as of 07/05/2023       Reactions   Aciphex [rabeprazole] Swelling   Bactrim [sulfamethoxazole-trimethoprim] Diarrhea, Nausea Only   Ceftin [cefuroxime Axetil] Hives   Protonix [pantoprazole] Hives, Swelling   Vilazodone Hcl Hives, Other (See Comments)   Tongue swelling        Medication List        Accurate as of July 05, 2023  1:58 PM. If you have any questions, ask your nurse or doctor.          acetaminophen 325 MG tablet Commonly known as: TYLENOL Take 325-650 mg by mouth every 6 (six) hours as needed for mild pain or fever.   Armour Thyroid 60 MG tablet Generic drug: thyroid Take 60 mg by mouth daily.   atorvastatin 40 MG tablet Commonly  known as: LIPITOR Take 40 mg by mouth at bedtime.   buPROPion 200 MG 12 hr tablet Commonly known as: WELLBUTRIN SR Take 200 mg by mouth 2 (two) times daily.   cyanocobalamin 1000 MCG tablet Take 1,000 mcg by mouth daily.   Gemtesa 75 MG Tabs Generic drug: Vibegron Take 1 tablet (75 mg total) by mouth daily. Started by: Brittany Cain   loratadine 10 MG tablet Commonly known as: CLARITIN Take 10 mg by mouth daily.   losartan 100 MG tablet Commonly known as: COZAAR Take 100 mg by mouth daily.   ondansetron 4 MG disintegrating tablet Commonly known as: ZOFRAN-ODT Take 1 tablet (4 mg total) by mouth every 8 (eight) hours as needed for nausea or vomiting.   phenazopyridine 200 MG tablet Commonly known as: PYRIDIUM Take 1 tablet (200 mg total) by mouth 3 (three) times daily.   pyridOXINE 50 MG tablet Commonly known as: VITAMIN B6 Take 50 mg by mouth 2 (two) times daily.   rOPINIRole 0.25 MG tablet Commonly known as: REQUIP Take 0.25 mg by mouth at bedtime.   trimethoprim 100 MG tablet Commonly known as: TRIMPEX Take 1 tablet (100 mg total) by mouth daily. Started by: Brittany Cain   Vitamin D3 125 MCG (5000 UT) Caps Take 5,000 Units by mouth daily.        Allergies:  Allergies  Allergen Reactions   Aciphex [Rabeprazole] Swelling   Bactrim [Sulfamethoxazole-Trimethoprim] Diarrhea and Nausea Only   Ceftin [Cefuroxime Axetil] Hives   Protonix [Pantoprazole] Hives and Swelling   Vilazodone Hcl Hives and Other (See Comments)    Tongue swelling    Family History: Family History  Problem Relation Age of Onset   Stroke Mother    Prostate cancer Father    Breast cancer Neg Hx     Social History:  reports that she has never smoked. She has never used smokeless tobacco. She reports that she does not drink alcohol and does not use drugs.   Physical Exam: BP 132/80   Pulse 74   Ht 5\' 3"  (1.6 m)   Wt 222 lb (100.7 kg)   BMI 39.33 kg/m   Constitutional:   Alert and oriented, No acute distress. HEENT: [place] AT Respiratory: Normal respiratory effort, no increased work of breathing. Psychiatric: Normal mood and affect.  Urinalysis Dipsticks 1+ Leukocytes/Microscopy Negative    Pertinent Imaging: CT and KUB were personally reviewed and interpreted.   CT EXAM: CT ABDOMEN AND PELVIS WITHOUT AND WITH CONTRAST   TECHNIQUE: Multidetector CT imaging of the abdomen and pelvis was performed following the standard protocol before and following the bolus administration of intravenous contrast.   CONTRAST:  OMNIPAQUE IOHEXOL 300 MG/ML  SOLN   COMPARISON:  CT October 29, 2021   FINDINGS: Lower chest: No acute abnormality. Normal size heart. No significant pericardial effusion/thickening.   Hepatobiliary: No suspicious hepatic lesion. Gallbladder surgically absent. No biliary ductal dilation.   Pancreas: Within normal limits.   Spleen: Within normal limits.   Adrenals/Urinary Tract: Bilateral adrenal glands are unremarkable.   No hydronephrosis. Punctate 2 mm  nonobstructive right lower pole renal stone. No left-sided renal stones. No ureteral or bladder calculi visualized. Symmetric enhancement and excretion of contrast in the bilateral kidneys. No suspicious filling defect visualized within the opacified portions of the collecting system or proximal ureters on delayed imaging. No solid enhancing renal masses.   Urinary bladder is grossly unremarkable for degree of distension.   Stomach/Bowel: Tiny hiatal hernia otherwise the stomach is grossly unremarkable. Normal positioning of the duodenum/ligament of Treitz. No pathologic dilation of small bowel. The appendix and terminal ileum are grossly unremarkable. Small volume formed stool in the colon. Left-sided colonic diverticulosis without findings of acute diverticulitis.   Vascular/Lymphatic: No abdominal aortic aneurysm.   Reproductive: Status post hysterectomy. No adnexal  masses.   Other: No abdominopelvic ascites.  Hernia repair appears intact.   Musculoskeletal: L4-S1 posterior spinal fusion and intervertebral disc spacers. No evidence of hardware loosening or fracture. Multilevel degenerative changes spine. Degenerative changes bilateral hips and SI joints. No acute osseous abnormality.   IMPRESSION: 1. Punctate 2 mm nonobstructive right lower pole renal stone. 2. No hydronephrosis. No ureteral or bladder calculi visualized. No solid enhancing renal masses. 3. Left-sided colonic diverticulosis without findings of acute diverticulitis.     Electronically Signed   By: Maudry Mayhew MD   On: 06/06/2021 15:07  DG Abdomen 1 View  Narrative CLINICAL DATA:  Right flank pain and history of renal calculi.  EXAM: ABDOMEN - 1 VIEW  COMPARISON:  CT of the abdomen and pelvis on 06/06/2021. Abdominal film on 11/05/2013  FINDINGS: Normal bowel gas pattern without obstruction or ileus. No abnormal calcifications identified overlying the kidneys, ureters or bladder. Stable clips related to prior cholecystectomy and hardware from prior lumbar fusion at L4-5 and L5-S1. Stable hernia mesh in the lower pelvis.  IMPRESSION: No acute findings. No evidence of renal or ureteral calculi by x-ray.   Electronically Signed By: Irish Lack M.D. On: 05/09/2023 17:24     Assessment & Plan:    1. Recurrent UTI She is on implant hormonal therapy. Short-term low dose antibiotic prophylaxis with trimethoprim 100 mg daily x3 months.  We discussed cranberry tablets and the hyphen mannose as possible UTI prevention. Follow-up 3 months or earlier for recurrent UTI symptoms.   2. Right nephrolithiasis 2 mm stone seen on CT 2022.  Recent episode of right flank pain and will order renal ultrasound.   3. Urinary incontinence She has urinary incontinence PVR today, if no significant PVR we'll give a trial of Gemtesa 75 mg daily to see if this has any impact  on her leakage. 3 month follow-up as above.  I have reviewed the above documentation for accuracy and completeness, and I agree with the above.   Brittany Altes, MD  [facility] [address], [address] [place], [place] [address] [phone]

## 2023-07-05 NOTE — Patient Instructions (Signed)
Cranberry, D-Mannose

## 2023-07-07 ENCOUNTER — Encounter: Payer: Self-pay | Admitting: Urology

## 2023-07-17 ENCOUNTER — Ambulatory Visit
Admission: RE | Admit: 2023-07-17 | Discharge: 2023-07-17 | Disposition: A | Discharge status: Home / Self Care | Payer: BC Managed Care – PPO | Source: Ambulatory Visit | Attending: Urology | Admitting: Urology

## 2023-07-17 DIAGNOSIS — N390 Urinary tract infection, site not specified: Secondary | ICD-10-CM | POA: Insufficient documentation

## 2023-08-06 DIAGNOSIS — E782 Mixed hyperlipidemia: Secondary | ICD-10-CM | POA: Diagnosis not present

## 2023-08-09 DIAGNOSIS — G252 Other specified forms of tremor: Secondary | ICD-10-CM | POA: Diagnosis not present

## 2023-08-12 DIAGNOSIS — E782 Mixed hyperlipidemia: Secondary | ICD-10-CM | POA: Diagnosis not present

## 2023-08-12 DIAGNOSIS — F331 Major depressive disorder, recurrent, moderate: Secondary | ICD-10-CM | POA: Diagnosis not present

## 2023-08-12 DIAGNOSIS — I10 Essential (primary) hypertension: Secondary | ICD-10-CM | POA: Diagnosis not present

## 2023-09-27 ENCOUNTER — Ambulatory Visit: Payer: BC Managed Care – PPO | Admitting: Urology

## 2023-10-24 ENCOUNTER — Ambulatory Visit: Payer: BC Managed Care – PPO | Admitting: Urology

## 2023-10-24 ENCOUNTER — Encounter: Payer: Self-pay | Admitting: Urology

## 2023-10-24 VITALS — BP 144/89 | HR 72 | Ht 63.0 in | Wt 222.0 lb

## 2023-10-24 DIAGNOSIS — Z87442 Personal history of urinary calculi: Secondary | ICD-10-CM

## 2023-10-24 DIAGNOSIS — R32 Unspecified urinary incontinence: Secondary | ICD-10-CM

## 2023-10-24 DIAGNOSIS — Z87440 Personal history of urinary (tract) infections: Secondary | ICD-10-CM | POA: Diagnosis not present

## 2023-10-24 DIAGNOSIS — N390 Urinary tract infection, site not specified: Secondary | ICD-10-CM

## 2023-10-24 DIAGNOSIS — N200 Calculus of kidney: Secondary | ICD-10-CM

## 2023-10-24 MED ORDER — GEMTESA 75 MG PO TABS: 75.0000 mg | ORAL_TABLET | Freq: Every day | ORAL | 11 refills | Status: DC

## 2023-10-24 NOTE — Progress Notes (Signed)
Brittany Cain,acting as a scribe for Brittany Altes, MD.,have documented all relevant documentation on the behalf of Brittany Altes, MD,as directed by  Brittany Altes, MD while in the presence of Brittany Altes, MD.  10/24/2023 2:18 PM   Brittany Cain 07/17/1961 [id number]  Referring provider: Marisue Ivan, MD [phone] [facility] [address] [facility] [place],  [place] [address]  Chief Complaint  Patient presents with   Follow-up   Urologic history: 1. Recurrent UTI  2. Nephrolithiasis CT 2022 punctate 2 mm right lower pole calculus  3. Mixed urinary incontinence  HPI: Brittany Cain is a 62 y.o. female who presents for a 3 month follow up visit.  Refer to my previous note 07/05/2023 She completed a short course of antibiotic therapy and has not had recurrent infections or symptoms since her last visit Gemtesa did help her frequency and urgency and she was interested in an Rx   PMH: Past Medical History:  Diagnosis Date   Anxiety    Chicken pox    Chronic back pain    Depression    GERD (gastroesophageal reflux disease)    History of kidney stones    Hypertension    Obesity    Restless leg syndrome     Surgical History: Past Surgical History:  Procedure Laterality Date   ABDOMINAL HYSTERECTOMY     BACK SURGERY     lower back; metal in lower back   CHOLECYSTECTOMY     COLONOSCOPY WITH PROPOFOL N/A 06/03/2023   Procedure: COLONOSCOPY WITH PROPOFOL;  Surgeon: Brittany Collins, DO;  Location: [facility] ENDOSCOPY;  Service: Gastroenterology;  Laterality: N/A;   CYSTOSCOPY/URETEROSCOPY/HOLMIUM LASER/STENT PLACEMENT Left 10/31/2019   Procedure: ,left stent placement,left retrograde pyelogram,cystoscopy;  Surgeon: Brittany Ape, MD;  Location: [facility] ORS;  Service: Urology;  Laterality: Left;   ESOPHAGOGASTRODUODENOSCOPY (EGD) WITH PROPOFOL N/A 05/24/2017   Procedure: ESOPHAGOGASTRODUODENOSCOPY (EGD) WITH PROPOFOL;  Surgeon: Brittany Deem, MD;   Location: [facility] ENDOSCOPY;  Service: Endoscopy;  Laterality: N/A;   EXTRACORPOREAL SHOCK WAVE LITHOTRIPSY Left 10/29/2019   Procedure: EXTRACORPOREAL SHOCK WAVE LITHOTRIPSY (ESWL);  Surgeon: Brittany Ape, MD;  Location: [facility] ORS;  Service: Urology;  Laterality: Left;   INCISIONAL HERNIA REPAIR N/A 06/01/2019   Procedure: LAPAROSCOPIC INCISIONAL HERNIA REPAIR WITH MESH;  Surgeon: Brittany Shiver, MD;  Location: [facility] ORS;  Service: General;  Laterality: N/A;   POLYPECTOMY  06/03/2023   Procedure: POLYPECTOMY;  Surgeon: Brittany Collins, DO;  Location: [facility] ENDOSCOPY;  Service: Gastroenterology;;    Home Medications:  Allergies as of 10/24/2023       Reactions   Aciphex [rabeprazole] Swelling   Bactrim [sulfamethoxazole-trimethoprim] Diarrhea, Nausea Only   Ceftin [cefuroxime Axetil] Hives   Protonix [pantoprazole] Hives, Swelling   Vilazodone Hcl Hives, Other (See Comments)   Tongue swelling        Medication List        Accurate as of October 24, 2023  2:18 PM. If you have any questions, ask your nurse or doctor.          acetaminophen 325 MG tablet Commonly known as: TYLENOL Take 325-650 mg by mouth every 6 (six) hours as needed for mild pain or fever.   Armour Thyroid 60 MG tablet Generic drug: thyroid Take 60 mg by mouth daily.   atorvastatin 40 MG tablet Commonly known as: LIPITOR Take 40 mg by mouth at bedtime.   buPROPion 200 MG 12 hr tablet Commonly known as: WELLBUTRIN SR  Take 200 mg by mouth 2 (two) times daily.   cyanocobalamin 1000 MCG tablet Take 1,000 mcg by mouth daily.   Gemtesa 75 MG Tabs Generic drug: Vibegron Take 1 tablet (75 mg total) by mouth daily. Started by: Brittany Cain   loratadine 10 MG tablet Commonly known as: CLARITIN Take 10 mg by mouth daily.   losartan 100 MG tablet Commonly known as: COZAAR Take 100 mg by mouth daily.   ondansetron 4 MG disintegrating tablet Commonly known as: ZOFRAN-ODT Take 1 tablet (4  mg total) by mouth every 8 (eight) hours as needed for nausea or vomiting.   phenazopyridine 200 MG tablet Commonly known as: PYRIDIUM Take 1 tablet (200 mg total) by mouth 3 (three) times daily.   pyridOXINE 50 MG tablet Commonly known as: VITAMIN B6 Take 50 mg by mouth 2 (two) times daily.   rOPINIRole 0.25 MG tablet Commonly known as: REQUIP Take 0.25 mg by mouth at bedtime.   trimethoprim 100 MG tablet Commonly known as: TRIMPEX Take 1 tablet (100 mg total) by mouth daily.   Vitamin D3 125 MCG (5000 UT) Caps Take 5,000 Units by mouth daily.        Allergies:  Allergies  Allergen Reactions   Aciphex [Rabeprazole] Swelling   Bactrim [Sulfamethoxazole-Trimethoprim] Diarrhea and Nausea Only   Ceftin [Cefuroxime Axetil] Hives   Protonix [Pantoprazole] Hives and Swelling   Vilazodone Hcl Hives and Other (See Comments)    Tongue swelling    Family History: Family History  Problem Relation Age of Onset   Stroke Mother    Prostate cancer Father    Breast cancer Neg Hx     Social History:  reports that she has never smoked. She has never used smokeless tobacco. She reports that she does not drink alcohol and does not use drugs.   Physical Exam: BP (!) 144/89   Pulse 72   Ht 5\' 3"  (1.6 m)   Wt 222 lb (100.7 kg)   BMI 39.33 kg/m   Constitutional:  Alert and oriented, No acute distress. HEENT: [place] AT, moist mucus membranes.  Trachea midline, no masses. Neurologic: Grossly intact, no focal deficits, moving all 4 extremities. Psychiatric: Normal mood and affect.   Assessment & Plan:    1. Recurrent UTI No recurrent UTI since her last visit and asymptomatic today She voided prior to coming to the office and was unable to give a specimen today  2. Nephrolithiasis Renal ultrasound performed after last visit that showed no hydronephrosis or urinary tract calculi  3. Mixed urinary incontinence Rx Gemtesa sent to pharmacy. We discussed if cost prohibitive, an  alternative medication can be tried We also discussed pelvic floor physical therapy for her stress incontinence, though she would like to hold off at this time   I have reviewed the above documentation for accuracy and completeness, and I agree with the above.   Brittany Altes, MD  [facility] [address] [place], [place] [address] [phone]

## 2023-12-09 DIAGNOSIS — I10 Essential (primary) hypertension: Secondary | ICD-10-CM | POA: Diagnosis not present

## 2023-12-09 DIAGNOSIS — E782 Mixed hyperlipidemia: Secondary | ICD-10-CM | POA: Diagnosis not present

## 2023-12-16 ENCOUNTER — Other Ambulatory Visit: Payer: Self-pay | Admitting: Family Medicine

## 2023-12-16 DIAGNOSIS — Z0000 Encounter for general adult medical examination without abnormal findings: Secondary | ICD-10-CM | POA: Diagnosis not present

## 2023-12-16 DIAGNOSIS — E782 Mixed hyperlipidemia: Secondary | ICD-10-CM | POA: Diagnosis not present

## 2023-12-16 DIAGNOSIS — Z1231 Encounter for screening mammogram for malignant neoplasm of breast: Secondary | ICD-10-CM

## 2023-12-16 DIAGNOSIS — Z23 Encounter for immunization: Secondary | ICD-10-CM | POA: Diagnosis not present

## 2023-12-16 DIAGNOSIS — I10 Essential (primary) hypertension: Secondary | ICD-10-CM | POA: Diagnosis not present

## 2024-01-14 DIAGNOSIS — M5416 Radiculopathy, lumbar region: Secondary | ICD-10-CM | POA: Diagnosis not present

## 2024-01-14 DIAGNOSIS — M542 Cervicalgia: Secondary | ICD-10-CM | POA: Diagnosis not present

## 2024-01-14 DIAGNOSIS — M25551 Pain in right hip: Secondary | ICD-10-CM | POA: Diagnosis not present

## 2024-01-22 IMAGING — CR DG CHEST 2V
2 series · 2 of 2 positions shown · non-contrast
Comparison: Chest x-ray 10/12/2020

CLINICAL DATA: Chest pain

EXAM:
CHEST - 2 VIEW

[chest pa]
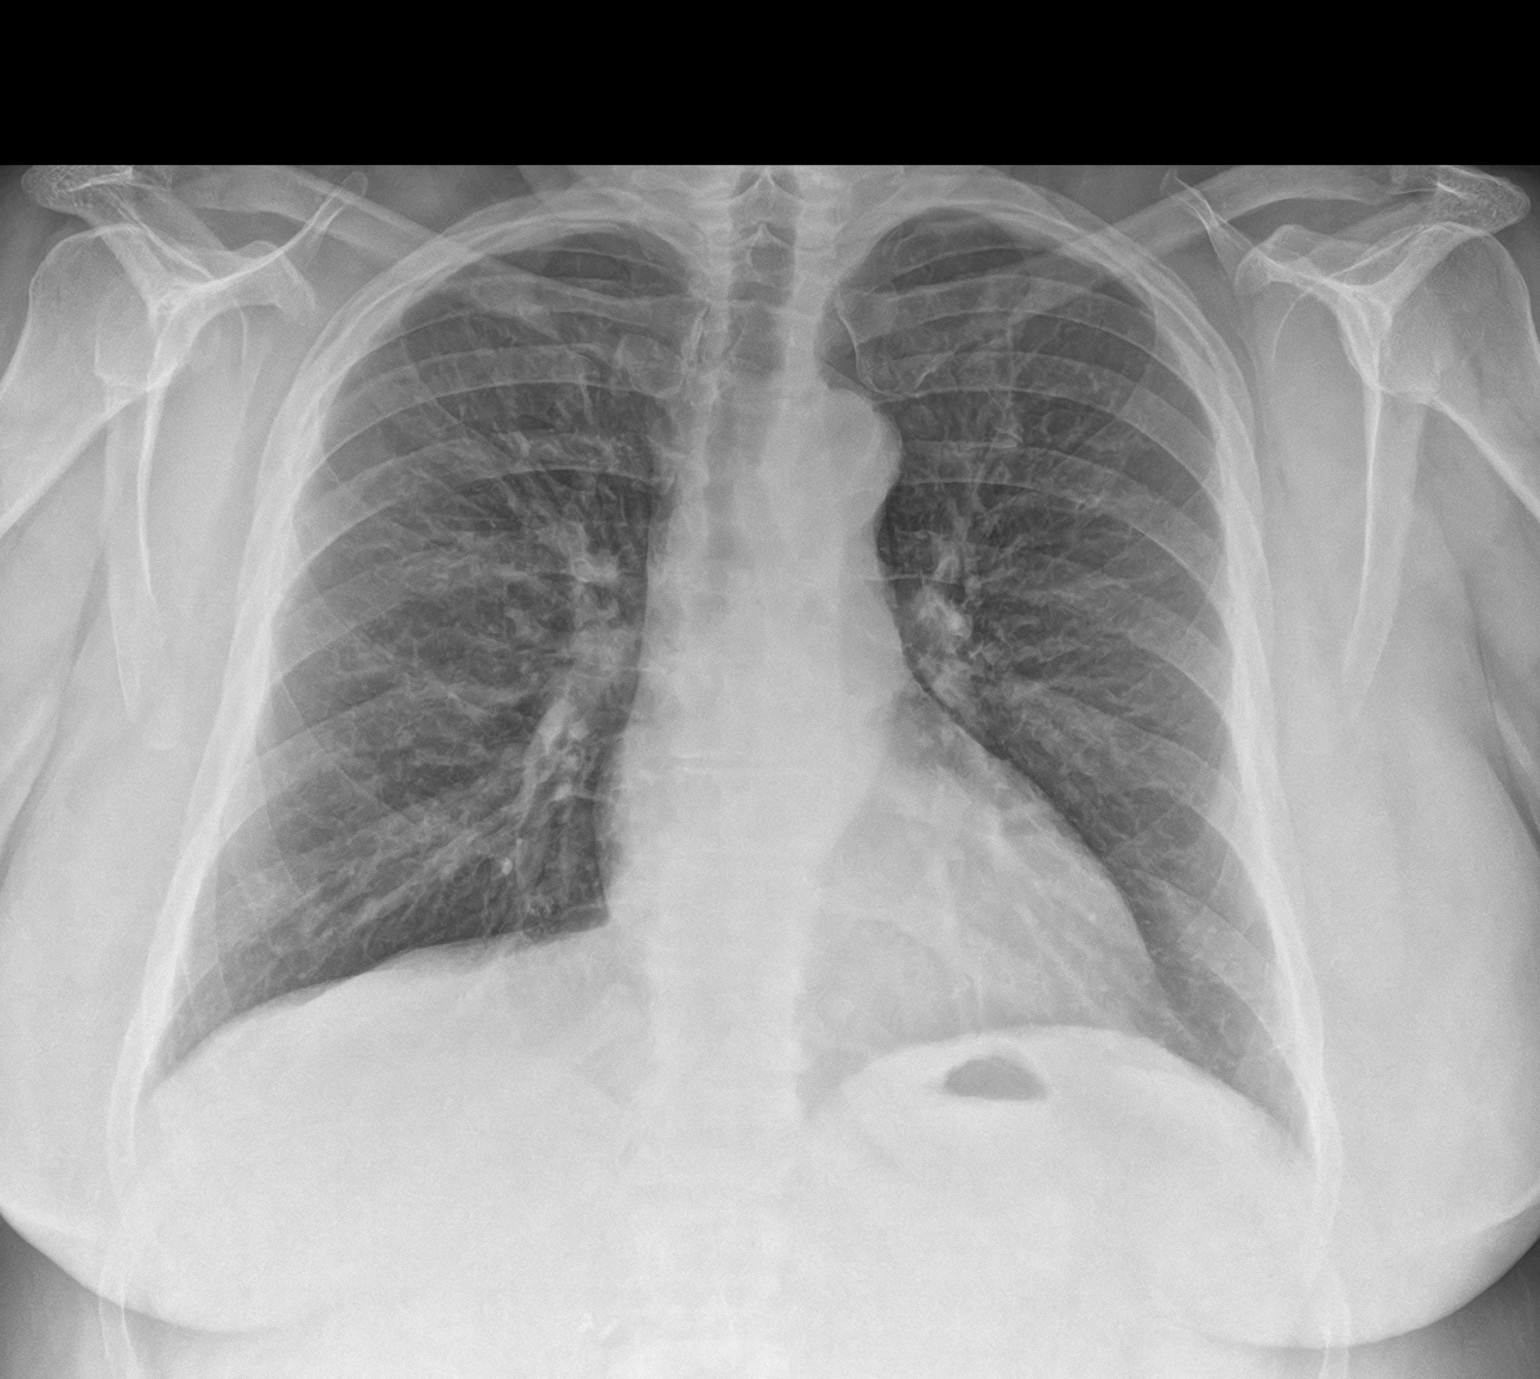

[chest lat]
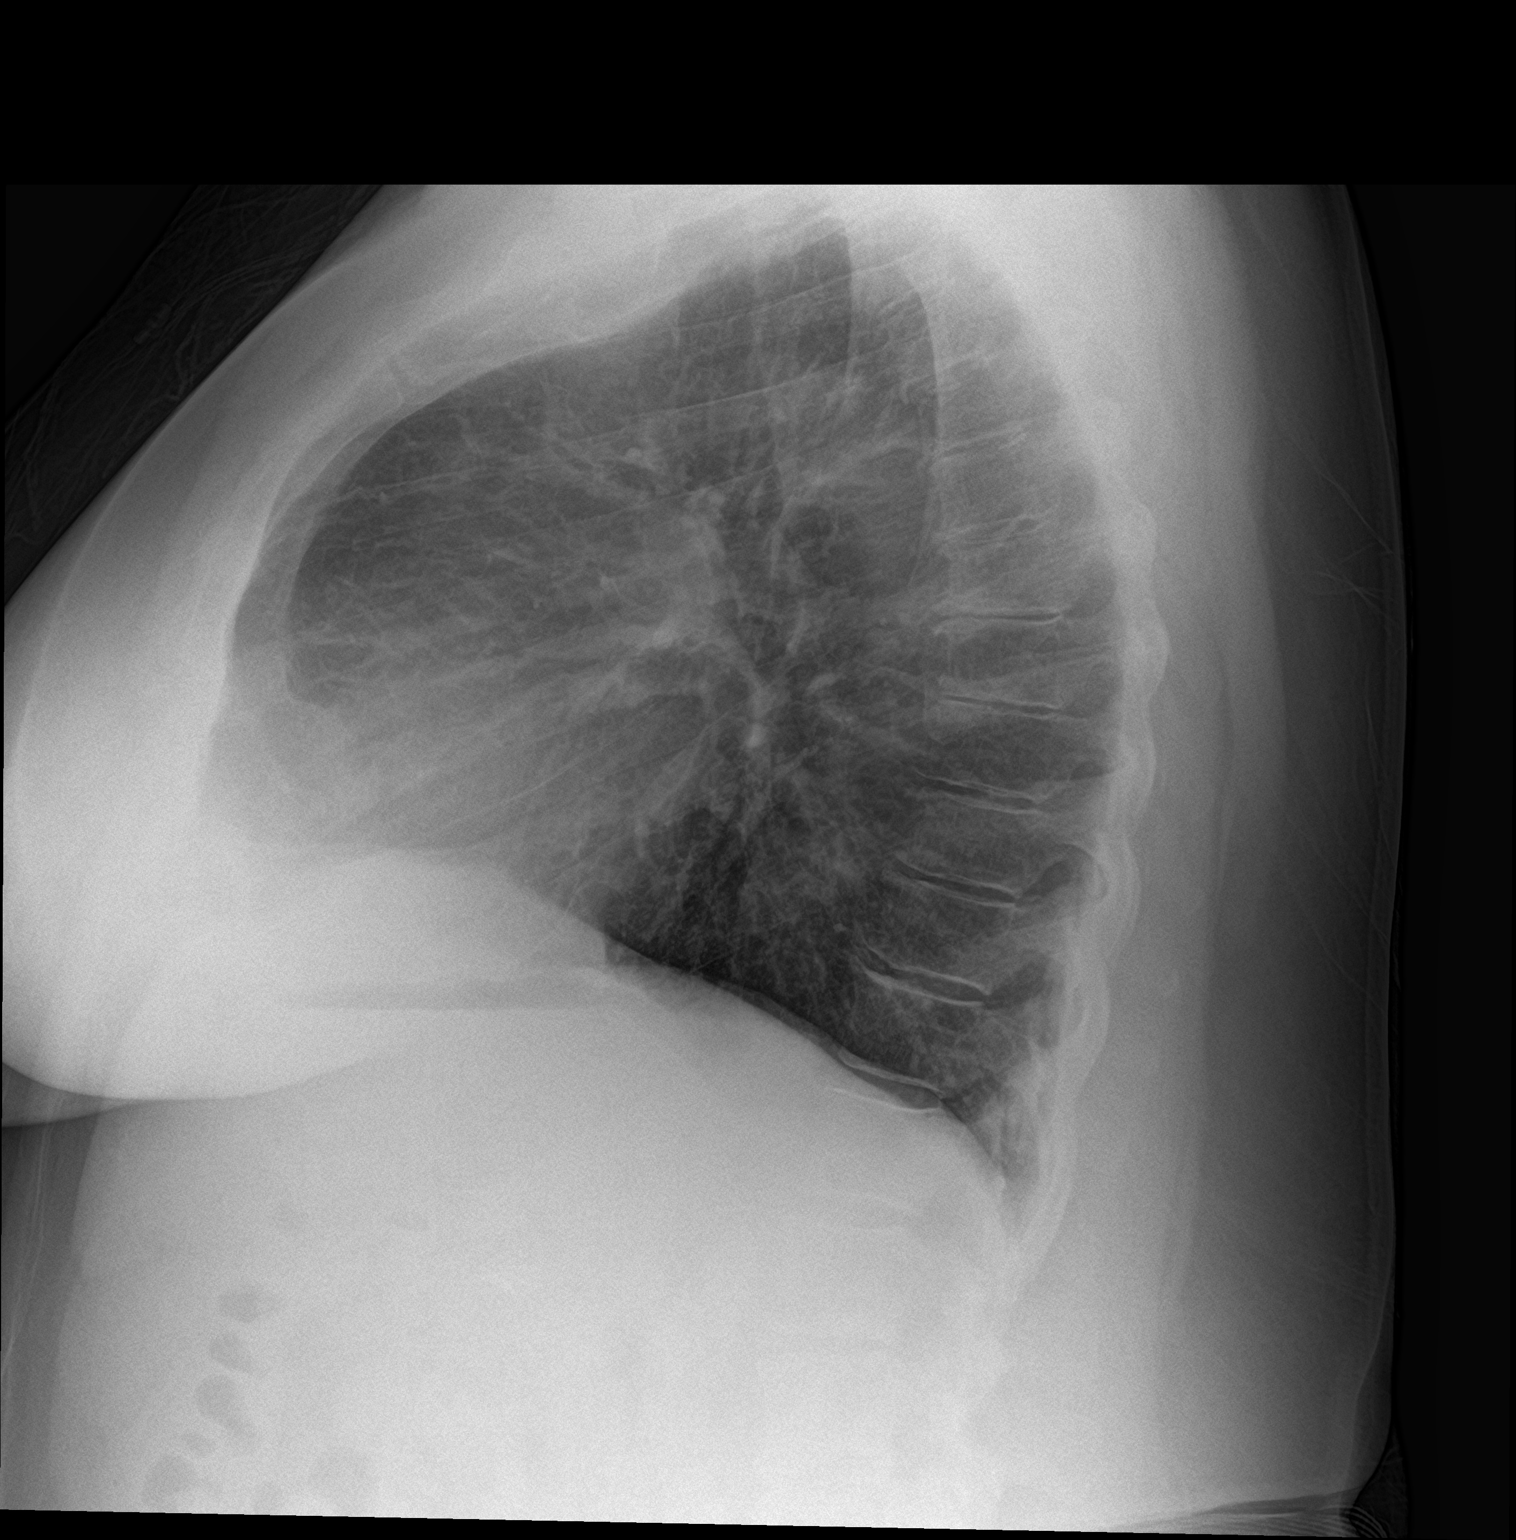

[2 of 2 positions shown; findings below may reference images not displayed]

FINDINGS: Heart size and mediastinal contours are within normal limits. No
suspicious pulmonary opacities identified.

No pleural effusion or pneumothorax visualized.

No acute osseous abnormality appreciated.
IMPRESSION: No acute intrathoracic process identified.

## 2024-02-12 DIAGNOSIS — M542 Cervicalgia: Secondary | ICD-10-CM | POA: Diagnosis not present

## 2024-02-12 DIAGNOSIS — M25551 Pain in right hip: Secondary | ICD-10-CM | POA: Diagnosis not present

## 2024-02-12 DIAGNOSIS — M546 Pain in thoracic spine: Secondary | ICD-10-CM | POA: Diagnosis not present

## 2024-02-24 DIAGNOSIS — M542 Cervicalgia: Secondary | ICD-10-CM | POA: Diagnosis not present

## 2024-02-24 DIAGNOSIS — M461 Sacroiliitis, not elsewhere classified: Secondary | ICD-10-CM | POA: Diagnosis not present

## 2024-02-24 DIAGNOSIS — M25551 Pain in right hip: Secondary | ICD-10-CM | POA: Diagnosis not present

## 2024-03-18 DIAGNOSIS — M542 Cervicalgia: Secondary | ICD-10-CM | POA: Diagnosis not present

## 2024-03-18 DIAGNOSIS — Z6841 Body Mass Index (BMI) 40.0 and over, adult: Secondary | ICD-10-CM | POA: Diagnosis not present

## 2024-05-06 DIAGNOSIS — L821 Other seborrheic keratosis: Secondary | ICD-10-CM | POA: Diagnosis not present

## 2024-05-06 DIAGNOSIS — Z79899 Other long term (current) drug therapy: Secondary | ICD-10-CM | POA: Diagnosis not present

## 2024-06-04 ENCOUNTER — Ambulatory Visit: Payer: Self-pay | Admitting: Internal Medicine

## 2024-06-04 ENCOUNTER — Encounter: Payer: Self-pay | Admitting: Internal Medicine

## 2024-06-04 VITALS — BP 118/86 | Ht 63.0 in | Wt 241.4 lb

## 2024-06-04 DIAGNOSIS — R109 Unspecified abdominal pain: Secondary | ICD-10-CM | POA: Diagnosis not present

## 2024-06-04 DIAGNOSIS — Z87442 Personal history of urinary calculi: Secondary | ICD-10-CM

## 2024-06-04 DIAGNOSIS — E66813 Obesity, class 3: Secondary | ICD-10-CM | POA: Insufficient documentation

## 2024-06-04 DIAGNOSIS — K219 Gastro-esophageal reflux disease without esophagitis: Secondary | ICD-10-CM | POA: Insufficient documentation

## 2024-06-04 DIAGNOSIS — F419 Anxiety disorder, unspecified: Secondary | ICD-10-CM

## 2024-06-04 DIAGNOSIS — R3129 Other microscopic hematuria: Secondary | ICD-10-CM

## 2024-06-04 DIAGNOSIS — R1032 Left lower quadrant pain: Secondary | ICD-10-CM

## 2024-06-04 DIAGNOSIS — Z6841 Body Mass Index (BMI) 40.0 and over, adult: Secondary | ICD-10-CM | POA: Insufficient documentation

## 2024-06-04 DIAGNOSIS — I10 Essential (primary) hypertension: Secondary | ICD-10-CM

## 2024-06-04 DIAGNOSIS — E039 Hypothyroidism, unspecified: Secondary | ICD-10-CM | POA: Insufficient documentation

## 2024-06-04 DIAGNOSIS — M545 Low back pain, unspecified: Secondary | ICD-10-CM

## 2024-06-04 DIAGNOSIS — R739 Hyperglycemia, unspecified: Secondary | ICD-10-CM | POA: Diagnosis not present

## 2024-06-04 DIAGNOSIS — G8929 Other chronic pain: Secondary | ICD-10-CM

## 2024-06-04 DIAGNOSIS — N3941 Urge incontinence: Secondary | ICD-10-CM | POA: Diagnosis not present

## 2024-06-04 DIAGNOSIS — E782 Mixed hyperlipidemia: Secondary | ICD-10-CM | POA: Diagnosis not present

## 2024-06-04 DIAGNOSIS — G2581 Restless legs syndrome: Secondary | ICD-10-CM

## 2024-06-04 DIAGNOSIS — N951 Menopausal and female climacteric states: Secondary | ICD-10-CM | POA: Insufficient documentation

## 2024-06-04 DIAGNOSIS — G252 Other specified forms of tremor: Secondary | ICD-10-CM | POA: Insufficient documentation

## 2024-06-04 DIAGNOSIS — F32A Depression, unspecified: Secondary | ICD-10-CM

## 2024-06-04 LAB — POCT URINE DIPSTICK
Glucose, UA: NEGATIVE mg/dL
Ketones, POC UA: NEGATIVE mg/dL
Nitrite, UA: NEGATIVE
Spec Grav, UA: 1.015 (ref 1.010–1.025)
Urobilinogen, UA: 0.2 U/dL
pH, UA: 6.5 (ref 5.0–8.0)

## 2024-06-04 MED ORDER — NITROFURANTOIN MONOHYD MACRO 100 MG PO CAPS: 100.0000 mg | ORAL_CAPSULE | Freq: Two times a day (BID) | ORAL | 0 refills | Status: DC

## 2024-06-04 MED ORDER — ESOMEPRAZOLE MAGNESIUM 40 MG PO CPDR: 40.0000 mg | DELAYED_RELEASE_CAPSULE | Freq: Every day | ORAL | 1 refills | Status: DC

## 2024-06-04 MED ORDER — GABAPENTIN 300 MG PO CAPS: 300.0000 mg | ORAL_CAPSULE | Freq: Every day | ORAL | 1 refills | Status: DC

## 2024-06-04 NOTE — Assessment & Plan Note (Signed)
 Managed with progesterone 200 mg and testosterone pellets Will monitor

## 2024-06-04 NOTE — Progress Notes (Signed)
 Subjective:    Patient ID: Brittany Cain, female    DOB: [birth year], 63 y.o.   MRN: [id number]  HPI  Patient presents the clinic today to establish care and for management of the conditions listed below.  Hypothyroidism: She is no longer taking armour thyroid , stopped about 6 months ago.  She does not follow with endocrinology.  Anxiety and depression: Chronic, managed on bupropion .  She is not currently seeing a therapist.  She denies SI/HI.  HTN: Her BP today is 118/86.  She is taking losartan and chlorthalidone as prescribed.  ECG from 01/2022 reviewed.  HLD: Her last LDL was 101, triglycerides 91, 04/2024.  She denies myalgias on atorvastatin .  She does not consume a low-fat diet.  Restless legs: Managed with ropinirole .  She follows with neurology.  History of kidney stones/OAB: She reports mainly urge incontinence. She has been having some left flank pain for the last 4 days. She is not currently taking any medication for this but has been on vibegron  and trimethoprim  in the past.  She no longer follows with urology.  Chronic back pain: s/p surgical intervention, fusion in 2000. Managed with Tylenol  OTC.  She has been on gabapentin  in the past and would like to get restarted on this.  MRI lumbar spine from 07/2009 reviewed.  She does not follow with orthopedics.  Orthostatic tremor: She reports she is taking clonazepam only as needed but has been prescribed gabapentin  in the past. She is following with neurology at [facility] for this but would like a referral for second opinion as she would like nerve conduction studies done.  GERD: She is not sure what triggers this, worse at night. She takes tums frequently. She has been on a medication in the past but can't remember the name of it. She has never had an upper GI.  Menopausal symptoms: She reports mainly fatigue. Managed with progesterone and testosterone pellets.  She follows with a specialist for this.  Review of  Systems   Past Medical History:  Diagnosis Date   Anxiety    Chicken pox    Chronic back pain    Depression    GERD (gastroesophageal reflux disease)    History of kidney stones    Hypertension    Obesity    Restless leg syndrome     Current Outpatient Medications  Medication Sig Dispense Refill   acetaminophen  (TYLENOL ) 325 MG tablet Take 325-650 mg by mouth every 6 (six) hours as needed for mild pain or fever.     ARMOUR THYROID  60 MG tablet Take 60 mg by mouth daily.     atorvastatin  (LIPITOR) 40 MG tablet Take 40 mg by mouth at bedtime.      buPROPion  (WELLBUTRIN  SR) 200 MG 12 hr tablet Take 200 mg by mouth 2 (two) times daily.     Cholecalciferol  (VITAMIN D3) 125 MCG (5000 UT) CAPS Take 5,000 Units by mouth daily.     cyanocobalamin  1000 MCG tablet Take 1,000 mcg by mouth daily.     loratadine  (CLARITIN ) 10 MG tablet Take 10 mg by mouth daily.     losartan (COZAAR) 100 MG tablet Take 100 mg by mouth daily.     ondansetron  (ZOFRAN -ODT) 4 MG disintegrating tablet Take 1 tablet (4 mg total) by mouth every 8 (eight) hours as needed for nausea or vomiting. 20 tablet 0   phenazopyridine  (PYRIDIUM ) 200 MG tablet Take 1 tablet (200 mg total) by mouth 3 (three) times daily. 6 tablet 0  pyridOXINE  (VITAMIN B-6) 50 MG tablet Take 50 mg by mouth 2 (two) times daily.     rOPINIRole  (REQUIP ) 0.25 MG tablet Take 0.25 mg by mouth at bedtime.     trimethoprim  (TRIMPEX ) 100 MG tablet Take 1 tablet (100 mg total) by mouth daily. 30 tablet 2   Vibegron  (GEMTESA ) 75 MG TABS Take 1 tablet (75 mg total) by mouth daily. 30 tablet 11   No current facility-administered medications for this visit.    Allergies  Allergen Reactions   Aciphex  [Rabeprazole ] Swelling   Bactrim [Sulfamethoxazole-Trimethoprim ] Diarrhea and Nausea Only   Ceftin [Cefuroxime Axetil] Hives   Protonix  [Pantoprazole ] Hives and Swelling   Vilazodone Hcl Hives and Other (See Comments)    Tongue swelling    Family History   Problem Relation Age of Onset   Stroke Mother    Prostate cancer Father    Breast cancer Neg Hx     Social History   Socioeconomic History   Marital status: Married    Spouse name: Not on file   Number of children: Not on file   Years of education: Not on file   Highest education level: Not on file  Occupational History   Not on file  Tobacco Use   Smoking status: Never   Smokeless tobacco: Never  Vaping Use   Vaping status: Never Used  Substance and Sexual Activity   Alcohol use: No   Drug use: No   Sexual activity: Not Currently  Other Topics Concern   Not on file  Social History Narrative   Not on file   Social Drivers of Health   Financial Resource Strain: Low Risk  (12/16/2023)   Received from [facility]   Overall Financial Resource Strain (CARDIA)    Difficulty of Paying Living Expenses: Not hard at all  Food Insecurity: No Food Insecurity (12/16/2023)   Received from [facility]   Hunger Vital Sign    Within the past 12 months, you worried that your food would run out before you got the money to buy more.: Never true    Within the past 12 months, the food you bought just didn't last and you didn't have money to get more.: Never true  Transportation Needs: No Transportation Needs (12/16/2023)   Received from [facility] - Transportation    In the past 12 months, has lack of transportation kept you from medical appointments or from getting medications?: No    Lack of Transportation (Non-Medical): No  Physical Activity: Not on file  Stress: Not on file  Social Connections: Not on file  Intimate Partner Violence: Not on file     Constitutional: Pt reports fatigue. Denies fever, malaise, headache or abrupt weight changes.  HEENT: Denies eye pain, eye redness, ear pain, ringing in the ears, wax buildup, runny nose, nasal congestion, bloody nose, or sore throat. Respiratory: Denies difficulty  breathing, shortness of breath, cough or sputum production.   Cardiovascular: Denies chest pain, chest tightness, palpitations or swelling in the hands or feet.  Gastrointestinal: Pt reports intermittent reflux, change in bowel habits, left flank pain. Denies abdominal pain, bloating, constipation, diarrhea or blood in the stool.  GU: Pt reports urge incontinence. Denies urgency, frequency, pain with urination, burning sensation, blood in urine, odor or discharge. Musculoskeletal: Patient reports chronic back pain.  Denies decrease in range of motion, difficulty with gait, muscle pain or joint swelling.  Skin: Denies redness, rashes, lesions or ulcercations.  Neurological: Patient reports restless legs, tremors.  Denies dizziness, difficulty with memory, difficulty with speech or problems with balance and coordination.  Psych: Patient has a history of anxiety and depression.  Denies SI/HI.  No other specific complaints in a complete review of systems (except as listed in HPI above).      Objective:   Physical Exam  BP 118/86 (BP Location: Left Arm, Patient Position: Sitting, Cuff Size: Large)   Ht 5' 3 (1.6 m)   Wt 241 lb 6.4 oz (109.5 kg)   BMI 42.76 kg/m   Wt Readings from Last 3 Encounters:  10/24/23 222 lb (100.7 kg)  07/05/23 222 lb (100.7 kg)  06/03/23 224 lb 12.8 oz (102 kg)    General: Appears her stated age, obese, in NAD. Skin: Warm, dry and intact.  HEENT: Head: normal shape and size; Eyes: sclera white, no icterus, conjunctiva pink, PERRLA and EOMs intact;  Neck:  Neck supple, trachea midline. No masses, lumps or thyromegaly present.  Cardiovascular: Normal rate and rhythm. S1,S2 noted.  No murmur, rubs or gallops noted. No JVD or BLE edema. No carotid bruits noted. Pulmonary/Chest: Normal effort and positive vesicular breath sounds. No respiratory distress. No wheezes, rales or ronchi noted.  Abdomen: Soft and nontender. Normal bowel sounds.  No CVA tenderness  noted. Musculoskeletal: She has a notable tremor with going from a sitting to a standing position.  Gait steady without device. Neurological: Alert and oriented. Cranial nerves II-XII grossly intact.  Psychiatric: Mood and affect normal. Behavior is normal. Judgment and thought content normal.    BMET    Component Value Date/Time   NA 138 02/04/2022 1552   NA 139 04/23/2014 1234   K 4.1 02/04/2022 1552   K 3.9 04/23/2014 1234   CL 102 02/04/2022 1552   CL 107 04/23/2014 1234   CO2 29 02/04/2022 1552   CO2 30 04/23/2014 1234   GLUCOSE 103 (H) 02/04/2022 1552   GLUCOSE 90 04/23/2014 1234   BUN 13 02/04/2022 1552   BUN 13 04/23/2014 1234   CREATININE 0.80 02/04/2022 1552   CREATININE 0.99 04/23/2014 1234   CALCIUM  9.3 02/04/2022 1552   CALCIUM  9.2 04/23/2014 1234   GFRNONAA >60 02/04/2022 1552   GFRNONAA >60 04/23/2014 1234   GFRAA 50 (L) 10/31/2019 1032   GFRAA >60 04/23/2014 1234    Lipid Panel  No results found for: CHOL, TRIG, HDL, CHOLHDL, VLDL, LDLCALC  CBC    Component Value Date/Time   WBC 9.8 02/04/2022 1552   RBC 4.61 02/04/2022 1552   HGB 13.8 02/04/2022 1552   HGB 12.5 04/23/2014 1234   HCT 42.2 02/04/2022 1552   HCT 37.2 04/23/2014 1234   PLT 327 02/04/2022 1552   PLT 294 04/23/2014 1234   MCV 91.5 02/04/2022 1552   MCV 88 04/23/2014 1234   MCH 29.9 02/04/2022 1552   MCHC 32.7 02/04/2022 1552   RDW 12.4 02/04/2022 1552   RDW 13.2 04/23/2014 1234   LYMPHSABS 2.1 10/17/2020 0414   MONOABS 1.2 (H) 10/17/2020 0414   EOSABS 0.0 10/17/2020 0414   BASOSABS 0.0 10/17/2020 0414    Hgb A1C Lab Results  Component Value Date   HGBA1C 5.5 10/31/2019            Assessment & Plan:   Left flank pain:  Urinalysis pending Push fluids  RTC in 6 months for your annual exam Helayne Lo, NP

## 2024-06-04 NOTE — Assessment & Plan Note (Signed)
 Stable on bupropion  200 mg twice daily Support offered

## 2024-06-04 NOTE — Assessment & Plan Note (Signed)
 C-Met and lipid profile today Encouraged her to consume a low-fat diet Continue atorvastatin  40 mg daily

## 2024-06-04 NOTE — Assessment & Plan Note (Signed)
 Try to identify and avoid foods that trigger reflux Encouraged weight loss as this can help reduce reflux symptoms We will start as omeprazole 40 mg daily

## 2024-06-04 NOTE — Patient Instructions (Signed)

## 2024-06-04 NOTE — Assessment & Plan Note (Signed)
 Encouraged diet and exercise for weight

## 2024-06-04 NOTE — Assessment & Plan Note (Signed)
 Continue ropinirole  0.25 mg at bedtime We will monitor

## 2024-06-04 NOTE — Assessment & Plan Note (Signed)
 Continue Tylenol  OTC Will add gabapentin  300 mg daily Encouraged regular stretching and core strengthening Encourage weight loss as this can help reduce back pain

## 2024-06-04 NOTE — Assessment & Plan Note (Signed)
 She questions this diagnosis and would like referral to neurology for second opinion I would advise her not to take clonazepam 0.5 mg unless absolutely necessary

## 2024-06-04 NOTE — Assessment & Plan Note (Signed)
 She is currently wearing pads and not taking any medications She no longer follows with urology

## 2024-06-04 NOTE — Assessment & Plan Note (Signed)
 Recent thyroid  studies reviewed She is not currently taking armour thyroid  and no indication that she needs to based on recent labs

## 2024-06-04 NOTE — Assessment & Plan Note (Signed)
 Controlled on losartan 100 mg and chlorthalidone 25 mg daily Reinforced DASH diet and exercise for weight loss C-Met today

## 2024-06-04 NOTE — Assessment & Plan Note (Signed)
 She is having left flank pain today Urinalysis pending

## 2024-06-05 LAB — COMPREHENSIVE METABOLIC PANEL WITH GFR
AG Ratio: 2.1 (calc) (ref 1.0–2.5)
ALT: 21 U/L (ref 6–29)
AST: 15 U/L (ref 10–35)
Albumin: 4.2 g/dL (ref 3.6–5.1)
Alkaline phosphatase (APISO): 57 U/L (ref 37–153)
BUN: 9 mg/dL (ref 7–25)
CO2: 29 mmol/L (ref 20–32)
Calcium: 10 mg/dL (ref 8.6–10.4)
Chloride: 101 mmol/L (ref 98–110)
Creat: 0.88 mg/dL (ref 0.50–1.05)
Globulin: 2 g/dL (ref 1.9–3.7)
Glucose, Bld: 102 mg/dL (ref 65–139)
Potassium: 3.8 mmol/L (ref 3.5–5.3)
Sodium: 139 mmol/L (ref 135–146)
Total Bilirubin: 1 mg/dL (ref 0.2–1.2)
Total Protein: 6.2 g/dL (ref 6.1–8.1)
eGFR: 74 mL/min/{1.73_m2} (ref >=60)

## 2024-06-05 LAB — CBC
HCT: 46.5 % — ABNORMAL HIGH (ref 35.0–45.0)
Hemoglobin: 15 g/dL (ref 11.7–15.5)
MCH: 30.8 pg (ref 27.0–33.0)
MCHC: 32.3 g/dL (ref 32.0–36.0)
MCV: 95.5 fL (ref 80.0–100.0)
MPV: 9.3 fL (ref 7.5–12.5)
Platelets: 352 10*3/uL (ref 140–400)
RBC: 4.87 10*6/uL (ref 3.80–5.10)
RDW: 12.2 % (ref 11.0–15.0)
WBC: 8.6 10*3/uL (ref 3.8–10.8)

## 2024-06-05 LAB — URINE CULTURE
MICRO NUMBER:: 16602592
Result:: NO GROWTH
SPECIMEN QUALITY:: ADEQUATE

## 2024-06-05 LAB — LIPID PANEL
Cholesterol: 202 mg/dL — ABNORMAL HIGH (ref <200)
HDL: 33 mg/dL — ABNORMAL LOW (ref >=50)
LDL Cholesterol (Calc): 129 mg/dL — ABNORMAL HIGH
Non-HDL Cholesterol (Calc): 169 mg/dL — ABNORMAL HIGH (ref <130)
Total CHOL/HDL Ratio: 6.1 (calc) — ABNORMAL HIGH (ref <5.0)
Triglycerides: 258 mg/dL — ABNORMAL HIGH (ref <150)

## 2024-06-05 LAB — HEMOGLOBIN A1C
Hgb A1c MFr Bld: 5.5 % (ref <5.7)
Mean Plasma Glucose: 111 mg/dL
eAG (mmol/L): 6.2 mmol/L

## 2024-06-05 MED ORDER — ATORVASTATIN CALCIUM 80 MG PO TABS: 80.0000 mg | ORAL_TABLET | Freq: Every day | ORAL | 3 refills | Status: DC

## 2024-06-08 ENCOUNTER — Encounter: Payer: Self-pay | Admitting: Internal Medicine

## 2024-06-08 ENCOUNTER — Ambulatory Visit: Attending: Internal Medicine

## 2024-06-08 NOTE — Telephone Encounter (Signed)
 Stat CT renal stone study ordered.  As far as sleeping medication, she is already on clonazepam and gabapentin .  I would likely not be willing to add additional medication at this time.  Would recommend Benadryl  50 mg at bedtime.

## 2024-06-24 DIAGNOSIS — M25562 Pain in left knee: Secondary | ICD-10-CM | POA: Diagnosis not present

## 2024-07-07 ENCOUNTER — Ambulatory Visit: Payer: Self-pay

## 2024-07-07 DIAGNOSIS — K219 Gastro-esophageal reflux disease without esophagitis: Secondary | ICD-10-CM | POA: Diagnosis not present

## 2024-07-07 DIAGNOSIS — Z860101 Personal history of adenomatous and serrated colon polyps: Secondary | ICD-10-CM | POA: Diagnosis not present

## 2024-07-07 DIAGNOSIS — D126 Benign neoplasm of colon, unspecified: Secondary | ICD-10-CM | POA: Diagnosis not present

## 2024-07-07 NOTE — Telephone Encounter (Signed)
 FYI Only or Action Required?: Action required by provider: request for appointment.  Patient was last seen in primary care on 06/04/2024 by Antonette Angeline ORN, NP.  Called Nurse Triage reporting Urinary Tract Infection.  Symptoms began several days ago.  Interventions attempted: Nothing.  Symptoms are: gradually worsening.  Triage Disposition: See Physician Within 24 Hours  Patient/caregiver understands and will follow disposition?: YesCopied from CRM [id number]. Topic: Clinical - Red Word Triage >> Jul 07, 2024 11:29 AM DeAngela L wrote: Red Word that prompted transfer to Nurse Triage: patient is having UTI symptoms burn strong smell of urine  Pt num [phone] (M) Reason for Disposition  Bad or foul-smelling urine  Answer Assessment - Initial Assessment Questions 1. SYMPTOM: What's the main symptom you're concerned about? (e.g., frequency, incontinence)     burning 2. ONSET: When did the    start?     Several days ago 3. PAIN: Is there any pain? If Yes, ask: How bad is it? (Scale: 1-10; mild, moderate, severe)     denies 4. CAUSE: What do you think is causing the symptoms?     Possible UTI 5. OTHER SYMPTOMS: Do you have any other symptoms? (e.g., blood in urine, fever, flank pain, pain with urination)    Odor; doesn't feel well  Pt wears pads all the time due to slighter bladder leak. Urine is cloudy. Pt noticed odor first several days ago but the burning started this morning. Booked soonest appt for 7/23 @ 1400.  Protocols used: Urinary Symptoms-A-AH

## 2024-07-07 NOTE — Progress Notes (Signed)
 New Patient / Consultation Note   Patient ID: Brittany Cain is a 63 y.o. female  Date of Visit: 07/07/2024  Requesting Provider: Self  PCP: Brittany Cain  Reason for Consultation: GERD and personal history of colon polyps  Patient's Chief Complaint:   Chief Complaint  Patient presents with  . Colonoscopy  . Gastroesophageal Reflux   HPI 63 year old Caucasian female with obesity, anxiety, depression, lumbago, hypertension, RLS, GERD and hyperlipidemia who was seen in the office for personal history of colon polyps and GERD.  She underwent colonoscopy attempts in June 2024, but prep was inadequate (BBPS 3).  She did have 2 adenomatous polyps removed, normal terminal ileum and internal hemorrhoid She reports her bowel movements consist of mushy.  She has 1 of these bowel movements per day.  This consistency has been present since her cholecystectomy.  She denies any melena or hematochezia.  No abdominal pain appetite or weight changes.    She notes that her reflux symptoms have been worsening requiring her to take more Tums than previous.  She was started on acid suppressive medication by her PCP, but does not remember which one.  She denies any dysphagia or odynophagia.  Reflux defined as burning chest discomfort frequently at night.  This will wake her up at night and she will experience regurgitation.  Her husband also notes that she has sleep apnea episodes including snoring and stopping breathing.  She does note that she drinks sodas fairly close in timing to when she goes to bed.  Status post hysterectomy and cholecystectomy.  Takes 600 mg of ibuprofen  daily  Patient denies nausea, vomiting, coffee ground emesis, hematemesis, abdominal pain, constipation, melena, hematochezia, dysphagia, odynophagia, jaundice, abnormal weight loss, fever, chills, night sweats.  Denies  Anti-plt agents, and anticoagulants Denies family history of gastrointestinal disease and  malignancy Previous Endoscopies: June 2024 colonoscopy BBPS 3 normal terminal ileum 2 adenomatous polyps and internal hemorrhoids  EGD in 2018 with grade B esophagitis, H. pylori negative gastritis and biopsies negative for Barrett's esophagus  Colonoscopy in March 2013 negative    Past Medical History:  Diagnosis Date  . Anxiety   . Chronic back pain    S/P surgery 2007 per Dr. Malcolm- [facility], [place]  . COVID-19 10/12/2020  . Depression   . GERD (gastroesophageal reflux disease)   . History of chicken pox   . History of kidney stones   . Hyperlipidemia   . Hypertension   . Obesity   . Postmenopausal    Mild menopausal syndrome- previously on HRT  . Restless leg syndrome     Past Surgical History:  Procedure Laterality Date  . COLONOSCOPY N/A 03/04/2012   Dr. EMERSON Cain @ [facility], [place] - Int. Hemorrhoids  . EGD  05/24/2017   Chronic gastritis/Erosive esophagitis/Repeat 8 wks/MUS  . HERNIA REPAIR  06/01/2019   incisional -- Dr Brittany Cain  . Colon @ [facility]  06/03/2023   Tubular adenomas/Reperat 6 to 12 months with improved prep/Office visit prior to scheduling repeat colon/SMR  (02/19/2024 Recall letter returned.awb)  . CESAREAN SECTION    . CHOLECYSTECTOMY    . HYSTERECTOMY    . Lower back surgery      Allergies  Allergen Reactions  . Aciphex  [Rabeprazole ] Hives and Swelling    Generic Aciphex - caused lip and eye swelling; hives  . Bactrim [Sulfamethoxazole-Trimethoprim ] Diarrhea and Nausea  . Ceftin [Cefuroxime Axetil] Hives  . Pantoprazole  Hives and Swelling    Caused lip and eye swelling; hives  .  Viibryd [Vilazodone] Hives and Other (See Comments)    Tongue swelling  . Vilazodone Hcl Hives and Other (See Comments)    Tongue swelling    Family History  Problem Relation Name Age of Onset  . Stroke Mother Brittany Cain        3 or more   . Prostate cancer Father Brittany Cain   . Coronary Artery Disease (Blocked arteries around heart) Sister Brittany Cain     Patient denies  family history of GI Disease or malignancy, inflammatory bowel disease, or solid organ transplantation  Social History   Tobacco Use  . Smoking status: Never    Passive exposure: Past  . Smokeless tobacco: Never  Vaping Use  . Vaping status: Never Used  Substance Use Topics  . Alcohol use: No    Alcohol/week: 0.0 standard drinks of alcohol  . Drug use: No     Pertinent GI related history and allergies were reviewed with the patient  Review of Systems  Constitutional:  Negative for activity change, appetite change, chills, diaphoresis, fatigue, fever and unexpected weight change.  HENT:  Negative for trouble swallowing and voice change.   Respiratory:  Negative for shortness of breath and wheezing.   Cardiovascular:  Negative for chest pain, palpitations and leg swelling.  Gastrointestinal:  Positive for diarrhea. Negative for abdominal distention, abdominal pain, anal bleeding, blood in stool, constipation, nausea, rectal pain and vomiting.       + worsening reflux  Genitourinary:  Negative for dysuria.  Musculoskeletal:  Negative for arthralgias and myalgias.  Skin:  Negative for color change and pallor.  Allergic/Immunologic: Negative for food allergies.  Neurological:  Negative for dizziness, syncope, weakness and light-headedness.  Psychiatric/Behavioral:  Negative for agitation and confusion.   All other systems reviewed and are negative.    Medications Current Outpatient Medications on File Prior to Visit  Medication Sig Dispense Refill  . acetaminophen  (TYLENOL ) 325 MG tablet Take 650 mg by mouth as needed for Pain.    . acetaminophen -pamabrom-pyrilam 500-25-15 mg Tab Take by mouth 2 (two) times daily as needed       . atorvastatin  (LIPITOR) 40 MG tablet TAKE 1 TABLET BY MOUTH EVERY DAY 90 tablet 1  . buPROPion  (WELLBUTRIN  SR) 200 MG SR tablet take 1 tablet by mouth every day 90 each 1  . chlorthalidone 25 MG tablet TAKE 1 TABLET BY MOUTH EVERY DAY 90 tablet 1  .  cholecalciferol  (VITAMIN D3) 5,000 unit capsule Take 5,000 Units by mouth once daily       . clonazePAM (KLONOPIN) 0.5 MG tablet Take 0.25 mg by mouth at bedtime    . cranberry extract 650 mg Cap Take 1 capsule by mouth once daily      . cyanocobalamin  (VITAMIN B12) 1000 MCG tablet Take 1,000 mcg by mouth once daily.    SABRA estradioL (ESTRACE) 0.01 % (0.1 mg/gram) vaginal cream APPLY 1 GRAM BY VAGINAL ROUTE AT BEDTIME    . gabapentin  (NEURONTIN ) 300 MG capsule Take 300 mg by mouth at bedtime    . loratadine  (CLARITIN ) 10 mg tablet Take 10 mg by mouth once daily.    SABRA losartan (COZAAR) 100 MG tablet TAKE 1 TABLET BY MOUTH EVERY DAY 90 tablet 1  . progesterone (PROMETRIUM) 200 MG capsule Take 200 mg by mouth nightly       . pumpkin seed extract/soy germ (AZO BLADDER CONTROL ORAL) Take by mouth as needed    . pyridoxine , vitamin B6, (VITAMIN B-6) 50 MG tablet Take 50  mg by mouth 2 (two) times daily.    . rOPINIRole  (REQUIP ) 0.25 MG immediate release tablet TAKE 1 TABLET BY MOUTH NIGHTLY 100 tablet 1  . trimethoprim  100 mg tablet Take 100 mg by mouth once daily (Patient not taking: Reported on 07/07/2024)     No current facility-administered medications on file prior to visit.    Pertinent GI related medications were reviewed with the patient  Objective  Vitals:   07/07/24 1027  BP: (!) 142/88  Pulse: 78  Temp: 36.6 C (97.8 F)  TempSrc: Oral  Weight: (!) 112 kg (247 lb)  Height: 160 cm (5' 3)    Wt Readings from Last 3 Encounters:  07/07/24 (!) 112 kg (247 lb)  05/06/24 (!) 109.3 kg (241 lb)  12/16/23 (!) 107 kg (236 lb)   Body mass index is 43.75 kg/m.   Physical Exam Vitals and nursing note reviewed.  Constitutional:      General: She is not in acute distress.    Appearance: Normal appearance. She is obese. She is not ill-appearing, toxic-appearing or diaphoretic.  HENT:     Head: Normocephalic and atraumatic.     Nose: Nose normal.     Mouth/Throat:     Mouth: Mucous  membranes are moist.     Pharynx: Oropharynx is clear.  Eyes:     General: No scleral icterus.    Extraocular Movements: Extraocular movements intact.  Cardiovascular:     Rate and Rhythm: Normal rate and regular rhythm.     Heart sounds: Normal heart sounds. No murmur heard.    No friction rub. No gallop.  Pulmonary:     Effort: No respiratory distress.     Breath sounds: Normal breath sounds. No wheezing, rhonchi or rales.  Abdominal:     General: Bowel sounds are normal. There is no distension.     Tenderness: There is no abdominal tenderness. There is no guarding (no rigidity, non peritoneal) or rebound.  Genitourinary:    Comments: Rectal exam deferred Musculoskeletal:     Cervical back: Neck supple.     Right lower leg: Edema present.     Left lower leg: Edema present.  Skin:    General: Skin is warm and dry.     Coloration: Skin is not jaundiced or pale.     Findings: No rash (on areas not covered by clothing).  Neurological:     General: No focal deficit present.     Mental Status: She is alert and oriented to person, place, and time. Mental status is at baseline.  Psychiatric:        Mood and Affect: Mood normal.        Behavior: Behavior normal.        Thought Content: Thought content normal.        Judgment: Judgment normal.     Laboratory Data: Most recent CBC and CMP from December 2024 reviewed by me Both within normal limits.  Hemoglobin 15.6 MCV 92 platelets 346,000 creatinine 1.0 Bilirubin elevated at 1.9 in setting of Gilbert disease  Imaging Studies: No recent pertinent GI imaging studies  Assessment:   1. Adenomatous polyp of colon, unspecified part of colon -Two removed on prior poor prep colonoscopy -Repeat colonoscopy due due to previous inadequate prep -     Ambulatory Referral to Colonoscopy and Upper Endoscopy  2. Gastroesophageal reflux disease, unspecified whether esophagitis present -Worsening reflux requiring additional doses of Tums -  She has had longstanding reflux with grade B esophagitis  on EGD in 2018 -Reflux symptoms of chest burning that wakes her up at night with regurgitation -Started on therapy by her PCP which is not found in epic.  She is unsure of the medication -     Ambulatory Referral to Colonoscopy and Upper Endoscopy  3. Morbid obesity- BMI 43.75 -Weight is increased 10 pounds since last year - Likely contributing to increasing reflux symptoms  4. NSAID Dependence - Taking 600 mg ibuprofen  daily  Other comorbidities considered in setting of upcoming sedation and endoscopy-RLS, lumbago, hypertension, hyperlipidemia, depression, suspected sleep apnea  Other orders -     peg-electrolyte (NULYTELY) solution; Take 4,000 mLs by mouth once for 1 dose Use as directed for colonoscopy   Plan:  Plan for colonoscopy and upper endoscopy at [facility] with GoLytely Start 1 capful of MiraLAX 1 week prior to procedure Take Dulcolax 2 days prior to procedure  Patient to call us  back and let us  know which acid suppressive therapy she was started on.  Recommend taking 20 to 30 minutes prior to supper given her nighttime symptoms  Initiate fiber therapy with instructions provided in AVS Lifestyle changes for reflux reviewed. Recommend discontinuation of tobacco and alcohol products, if currently using Avoid caffeine, chocolate, carbonated drinks (soda), large meals/overeating, spicy foods, or other trigger foods.  Do not eat within 3 hours of bedtime. Elevate your head during sleep with additional pillows or wedge, if needed Avoid tight fitting clothing Weight loss of 10% recommended.   Reviewed recent colonoscopy report with the patient  Esophagogastroduodenoscopy and colonoscopy with possible biopsy, control of bleeding, polypectomy, and interventions as necessary has been discussed with the patient/patient representative. Informed consent was obtained from the patient/patient representative after explaining the  indication, nature, and risks of the procedures including but not limited to death, bleeding, perforation, missed neoplasm/lesions, cardiorespiratory compromise, and reaction to medications. Opportunity for questions was given and appropriate answers were provided. Patient/patient representative has verbalized understanding is amenable to undergoing the procedures.  43 minutes were spent in this encounter including pre-visit review of records (including pertinent notes, labs, imaging), in office visit, and post visit documentation Thank you for allowing us  to participate in this patient's care. Please do not hesitate to call if any questions or concerns arise.    Attestation Statement:   I personally performed the service. (TP)  STEVEN MICHAEL RUSSO, DO   [facility]   Portions of the record may have been created with voice recognition software. Occasional wrong-word or 'sound-a-like' substitutions may have occurred due to the inherent limitations of voice recognition software.  Read the chart carefully and recognize, using context, where substitutions may have occurred.

## 2024-07-08 ENCOUNTER — Ambulatory Visit

## 2024-07-08 ENCOUNTER — Telehealth: Payer: Self-pay

## 2024-07-08 VITALS — BP 124/80 | HR 88 | Ht 63.0 in | Wt 247.2 lb

## 2024-07-08 DIAGNOSIS — R300 Dysuria: Secondary | ICD-10-CM | POA: Diagnosis not present

## 2024-07-08 MED ORDER — FOSFOMYCIN TROMETHAMINE 3 G PO PACK: 3.0000 g | PACK | Freq: Once | ORAL | 0 refills | Status: AC

## 2024-07-08 NOTE — Telephone Encounter (Signed)
 Refill on requip , historical provider listed in chart

## 2024-07-08 NOTE — Progress Notes (Signed)
 SUBJECTIVE: Brittany Cain is a 63 y.o. female who complains of urinary frequency, urgency and dysuria x 2 days, without flank pain, fever, chills, or abnormal vaginal discharge or bleeding.   OBJECTIVE: Appears well, in no apparent distress.  Vital signs are normal. The abdomen is soft without tenderness, guarding, mass, rebound or organomegaly. No CVA tenderness. Urine dipstick shows not done.  Urinalysis pending.   ASSESSMENT: Acute cystitis uncomplicated without evidence of pyelonephritis  PLAN: Take antibiotic as directed.  Drink plenty of fluids,  may use Pyridium  OTC prn. Call or return to clinic prn if fever, back pain, nausea, vomiting or symptoms do not resolve. +  Problem List Items Addressed This Visit       Other   Dysuria - Primary   Relevant Orders   Urinalysis, Routine w reflex microscopic

## 2024-07-09 ENCOUNTER — Ambulatory Visit: Payer: Self-pay

## 2024-07-09 LAB — URINALYSIS, ROUTINE W REFLEX MICROSCOPIC
Bilirubin Urine: NEGATIVE
Glucose, UA: NEGATIVE
Hgb urine dipstick: NEGATIVE
Hyaline Cast: NONE SEEN /LPF
Ketones, ur: NEGATIVE
Leukocytes,Ua: NEGATIVE
Nitrite: POSITIVE — AB
Protein, ur: NEGATIVE
RBC / HPF: NONE SEEN /HPF (ref 0–2)
Specific Gravity, Urine: 1.017 (ref 1.001–1.035)
Squamous Epithelial / HPF: NONE SEEN /HPF (ref <=5)
WBC, UA: NONE SEEN /HPF (ref 0–5)
pH: 8.5 — ABNORMAL HIGH (ref 5.0–8.0)

## 2024-07-09 LAB — MICROSCOPIC MESSAGE

## 2024-07-09 MED ORDER — ROPINIROLE HCL 0.25 MG PO TABS: 0.2500 mg | ORAL_TABLET | Freq: Every day | ORAL | 1 refills | Status: DC

## 2024-07-09 NOTE — Telephone Encounter (Signed)
 Ropinirole  refill sent to [facility]

## 2024-07-09 NOTE — Addendum Note (Signed)
 Addended by: ANTONETTE ANGELINE ORN on: 07/09/2024 07:37 AM   Modules accepted: Orders

## 2024-07-20 ENCOUNTER — Ambulatory Visit
Admission: RE | Admit: 2024-07-20 | Discharge: 2024-07-20 | Disposition: A | Discharge status: Home / Self Care | Attending: Gastroenterology | Admitting: Gastroenterology

## 2024-07-20 ENCOUNTER — Other Ambulatory Visit: Payer: Self-pay

## 2024-07-20 ENCOUNTER — Encounter: Payer: Self-pay | Admitting: Gastroenterology

## 2024-07-20 ENCOUNTER — Ambulatory Visit: Admitting: Anesthesiology

## 2024-07-20 ENCOUNTER — Encounter
Admission: RE | Disposition: A | Discharge status: Home / Self Care | Payer: Self-pay | Source: Home / Self Care | Attending: Gastroenterology

## 2024-07-20 DIAGNOSIS — K2289 Other specified disease of esophagus: Secondary | ICD-10-CM | POA: Diagnosis not present

## 2024-07-20 DIAGNOSIS — Z90710 Acquired absence of both cervix and uterus: Secondary | ICD-10-CM | POA: Insufficient documentation

## 2024-07-20 DIAGNOSIS — I129 Hypertensive chronic kidney disease with stage 1 through stage 4 chronic kidney disease, or unspecified chronic kidney disease: Secondary | ICD-10-CM | POA: Insufficient documentation

## 2024-07-20 DIAGNOSIS — K5730 Diverticulosis of large intestine without perforation or abscess without bleeding: Secondary | ICD-10-CM | POA: Insufficient documentation

## 2024-07-20 DIAGNOSIS — F419 Anxiety disorder, unspecified: Secondary | ICD-10-CM | POA: Diagnosis not present

## 2024-07-20 DIAGNOSIS — D1779 Benign lipomatous neoplasm of other sites: Secondary | ICD-10-CM | POA: Insufficient documentation

## 2024-07-20 DIAGNOSIS — Z1211 Encounter for screening for malignant neoplasm of colon: Secondary | ICD-10-CM | POA: Diagnosis not present

## 2024-07-20 DIAGNOSIS — E66813 Obesity, class 3: Secondary | ICD-10-CM | POA: Diagnosis not present

## 2024-07-20 DIAGNOSIS — Z6841 Body Mass Index (BMI) 40.0 and over, adult: Secondary | ICD-10-CM | POA: Insufficient documentation

## 2024-07-20 DIAGNOSIS — Z9049 Acquired absence of other specified parts of digestive tract: Secondary | ICD-10-CM | POA: Insufficient documentation

## 2024-07-20 DIAGNOSIS — E039 Hypothyroidism, unspecified: Secondary | ICD-10-CM | POA: Diagnosis not present

## 2024-07-20 DIAGNOSIS — K2100 Gastro-esophageal reflux disease with esophagitis, without bleeding: Secondary | ICD-10-CM | POA: Diagnosis not present

## 2024-07-20 DIAGNOSIS — Z860101 Personal history of adenomatous and serrated colon polyps: Secondary | ICD-10-CM | POA: Diagnosis not present

## 2024-07-20 DIAGNOSIS — Z860100 Personal history of colon polyps, unspecified: Secondary | ICD-10-CM | POA: Insufficient documentation

## 2024-07-20 DIAGNOSIS — D175 Benign lipomatous neoplasm of intra-abdominal organs: Secondary | ICD-10-CM | POA: Diagnosis not present

## 2024-07-20 DIAGNOSIS — K640 First degree hemorrhoids: Secondary | ICD-10-CM | POA: Insufficient documentation

## 2024-07-20 DIAGNOSIS — K219 Gastro-esophageal reflux disease without esophagitis: Secondary | ICD-10-CM | POA: Diagnosis not present

## 2024-07-20 DIAGNOSIS — N189 Chronic kidney disease, unspecified: Secondary | ICD-10-CM | POA: Diagnosis not present

## 2024-07-20 HISTORY — PX: POLYPECTOMY: SHX149

## 2024-07-20 HISTORY — PX: ESOPHAGOGASTRODUODENOSCOPY: SHX5428

## 2024-07-20 HISTORY — PX: COLONOSCOPY: SHX5424

## 2024-07-20 SURGERY — COLONOSCOPY
Anesthesia: General

## 2024-07-20 MED ORDER — GLYCOPYRROLATE 0.2 MG/ML IJ SOLN
INTRAMUSCULAR | Status: AC
  Filled 2024-07-20: qty 1

## 2024-07-20 MED ORDER — SODIUM CHLORIDE 0.9 % IV SOLN: INTRAVENOUS | Status: DC

## 2024-07-20 MED ORDER — LIDOCAINE HCL (CARDIAC) PF 100 MG/5ML IV SOSY
PREFILLED_SYRINGE | INTRAVENOUS | Status: DC | PRN
  Administered 2024-07-20: 60 mg via INTRAVENOUS

## 2024-07-20 MED ORDER — PROPOFOL 10 MG/ML IV BOLUS
INTRAVENOUS | Status: AC
  Filled 2024-07-20: qty 20

## 2024-07-20 MED ORDER — GLYCOPYRROLATE 0.2 MG/ML IJ SOLN
INTRAMUSCULAR | Status: DC | PRN
  Administered 2024-07-20: .2 mg via INTRAVENOUS

## 2024-07-20 MED ORDER — PROPOFOL 500 MG/50ML IV EMUL
INTRAVENOUS | Status: DC | PRN
  Administered 2024-07-20: 20 mg via INTRAVENOUS
  Administered 2024-07-20: 30 mg via INTRAVENOUS
  Administered 2024-07-20: 120 ug/kg/min via INTRAVENOUS
  Administered 2024-07-20: 150 mg via INTRAVENOUS

## 2024-07-20 NOTE — Op Note (Signed)
 [facility] Patient Name: Brittany Cain Procedure Date: 07/20/2024 12:47 PM MRN: [id number] Account #: [id number] Date of Birth: 07/24/1961 Admit Type: Outpatient Age: 63 Room: [facility] ENDO ROOM 2 Gender: Female Note Status: Finalized Instrument Name: Colonscope [id number] Procedure:             Colonoscopy Indications:           High risk colon cancer surveillance: Personal history                         of colonic polyps Providers:             Elspeth Ozell Jungling DO, DO Medicines:             Monitored Anesthesia Care Complications:         No immediate complications. Estimated blood loss:                         Minimal. Procedure:             Pre-Anesthesia Assessment:                        - Prior to the procedure, a History and Physical was                         performed, and patient medications and allergies were                         reviewed. The patient is competent. The risks and                         benefits of the procedure and the sedation options and                         risks were discussed with the patient. All questions                         were answered and informed consent was obtained.                         Patient identification and proposed procedure were                         verified by the physician, the nurse, the anesthetist                         and the technician in the endoscopy suite. Mental                         Status Examination: alert and oriented. Airway                         Examination: normal oropharyngeal airway and neck                         mobility. Respiratory Examination: clear to                         auscultation. CV Examination: RRR, no murmurs, no S3  or S4. Prophylactic Antibiotics: The patient does not                         require prophylactic antibiotics. Prior                         Anticoagulants: The patient has taken no anticoagulant                          or antiplatelet agents. ASA Grade Assessment: III - A                         patient with severe systemic disease. After reviewing                         the risks and benefits, the patient was deemed in                         satisfactory condition to undergo the procedure. The                         anesthesia plan was to use monitored anesthesia care                         (MAC). Immediately prior to administration of                         medications, the patient was re-assessed for adequacy                         to receive sedatives. The heart rate, respiratory                         rate, oxygen saturations, blood pressure, adequacy of                         pulmonary ventilation, and response to care were                         monitored throughout the procedure. The physical                         status of the patient was re-assessed after the                         procedure.                        After obtaining informed consent, the colonoscope was                         passed under direct vision. Throughout the procedure,                         the patient's blood pressure, pulse, and oxygen                         saturations were monitored continuously. The  Colonoscope was introduced through the anus and                         advanced to the the terminal ileum, with                         identification of the appendiceal orifice and IC                         valve. The colonoscopy was performed without                         difficulty. The patient tolerated the procedure well.                         The quality of the bowel preparation was evaluated                         using the BBPS [facility] Bowel Preparation Scale) with                         scores of: Right Colon = 2 (minor amount of residual                         staining, small fragments of stool and/or opaque                         liquid, but  mucosa seen well), Transverse Colon = 2                         (minor amount of residual staining, small fragments of                         stool and/or opaque liquid, but mucosa seen well) and                         Left Colon = 3 (entire mucosa seen well with no                         residual staining, small fragments of stool or opaque                         liquid). The total BBPS score equals 7. The quality of                         the bowel preparation was good. The terminal ileum,                         ileocecal valve, appendiceal orifice, and rectum were                         photographed. Findings:      The perianal and digital rectal examinations were normal. Pertinent       negatives include normal sphincter tone.      The terminal ileum appeared normal. Estimated blood loss: none.      Retroflexion in the right colon was performed.  A 1 to 2 mm polyp was found in the ascending colon. The polyp was       sessile. The polyp was removed with a jumbo cold forceps. Resection and       retrieval were complete. Estimated blood loss was minimal.      Multiple small-mouthed diverticula were found in the sigmoid colon.       Estimated blood loss: none.      Non-bleeding internal hemorrhoids were found during retroflexion. The       hemorrhoids were Grade I (internal hemorrhoids that do not prolapse).       Estimated blood loss: none.      The exam was otherwise without abnormality on direct and retroflexion       views. Impression:            - The examined portion of the ileum was normal.                        - One 1 to 2 mm polyp in the ascending colon, removed                         with a jumbo cold forceps. Resected and retrieved.                        - Diverticulosis in the sigmoid colon.                        - Non-bleeding internal hemorrhoids.                        - The examination was otherwise normal on direct and                          retroflexion views. Recommendation:        - Patient has a contact number available for                         emergencies. The signs and symptoms of potential                         delayed complications were discussed with the patient.                         Return to normal activities tomorrow. Written                         discharge instructions were provided to the patient.                        - Discharge patient to home.                        - Resume previous diet.                        - Continue present medications.                        - Await pathology results.                        -  Repeat colonoscopy for surveillance based on                         pathology results.                        - Return to referring physician as previously                         scheduled.                        - The findings and recommendations were discussed with                         the patient. Procedure Code(s):     --- Professional ---                        [phone], Colonoscopy, flexible; with biopsy, single or                         multiple Diagnosis Code(s):     --- Professional ---                        Z86.010, Personal history of colonic polyps                        K64.0, First degree hemorrhoids                        D12.2, Benign neoplasm of ascending colon                        K57.30, Diverticulosis of large intestine without                         perforation or abscess without bleeding CPT copyright 2022 American Medical Association. All rights reserved. The codes documented in this report are preliminary and upon coder review may  be revised to meet current compliance requirements. Attending Participation:      I personally performed the entire procedure. Elspeth Jungling, DO Elspeth Ozell Jungling DO, DO 07/20/2024 1:36:25 PM This report has been signed electronically. Number of Addenda: 0 Note Initiated On: 07/20/2024 12:47 PM Scope Withdrawal Time: 0  hours 12 minutes 51 seconds  Total Procedure Duration: 0 hours 14 minutes 43 seconds  Estimated Blood Loss:  Estimated blood loss was minimal.      [facility]

## 2024-07-20 NOTE — Transfer of Care (Signed)
 Immediate Anesthesia Transfer of Care Note  Patient: Brittany Cain  Procedure(s) Performed: COLONOSCOPY EGD (ESOPHAGOGASTRODUODENOSCOPY)  Patient Location: PACU and Endoscopy Unit  Anesthesia Type:MAC  Level of Consciousness: sedated  Airway & Oxygen Therapy: Patient Spontanous Breathing  Post-op Assessment: Report given to RN and Post -op Vital signs reviewed and stable  Post vital signs: Reviewed and stable  Last Vitals:  Vitals Value Taken Time  BP    Temp    Pulse 97 07/20/24 13:32  Resp 21 07/20/24 13:32  SpO2 95 % 07/20/24 13:32  Vitals shown include unfiled device data.  Last Pain:  Vitals:   07/20/24 1220  TempSrc: Temporal  PainSc: 0-No pain         Complications: No notable events documented.

## 2024-07-20 NOTE — H&P (Signed)
 Pre-Procedure H&P   Patient ID: Brittany Cain is a 63 y.o. female.  Gastroenterology Provider: Elspeth Ozell Jungling, DO  PCP: Antonette Angeline ORN, NP  Date: 07/20/2024  HPI Ms. Brittany Cain is a 63 y.o. female who presents today for Esophagogastroduodenoscopy and Colonoscopy for Worsening reflux, personal history of colon polyp .  Patient has noted worsening reflux.  She denies dysphagia and odynophagia.  Underwent colonoscopy in June 2024 with poor bowel prep.  2 adenomatous polyps were removed, normal TI and internal hemorrhoids. 2013 colonoscopy normal  Last underwent EGD in 2018 with grade B esophagitis.  Biopsies negative for Barrett's esophagus and negative for H. pylori  Status post hysterectomy cholecystectomy 600 mg of ibuprofen  daily  Hemoglobin 15.6 MCV 92 platelets 246,000 creatinine 1.7   Past Medical History:  Diagnosis Date   Anxiety    Chicken pox    Chronic back pain    Chronic kidney disease    Depression    GERD (gastroesophageal reflux disease)    History of kidney stones    Hyperlipidemia    Hypertension    Obesity    Restless leg syndrome     Past Surgical History:  Procedure Laterality Date   ABDOMINAL HYSTERECTOMY     BACK SURGERY     lower back; metal in lower back   CHOLECYSTECTOMY     COLONOSCOPY WITH PROPOFOL  N/A 06/03/2023   Procedure: COLONOSCOPY WITH PROPOFOL ;  Surgeon: Jungling Elspeth Ozell, DO;  Location: [facility] ENDOSCOPY;  Service: Gastroenterology;  Laterality: N/A;   CYSTOSCOPY/URETEROSCOPY/HOLMIUM LASER/STENT PLACEMENT Left 10/31/2019   Procedure: ,left stent placement,left retrograde pyelogram,cystoscopy;  Surgeon: Kassie Ozell SAUNDERS, MD;  Location: [facility] ORS;  Service: Urology;  Laterality: Left;   ESOPHAGOGASTRODUODENOSCOPY (EGD) WITH PROPOFOL  N/A 05/24/2017   Procedure: ESOPHAGOGASTRODUODENOSCOPY (EGD) WITH PROPOFOL ;  Surgeon: Gaylyn Gladis PENNER, MD;  Location: [facility] ENDOSCOPY;  Service: Endoscopy;  Laterality: N/A;    EXTRACORPOREAL SHOCK WAVE LITHOTRIPSY Left 10/29/2019   Procedure: EXTRACORPOREAL SHOCK WAVE LITHOTRIPSY (ESWL);  Surgeon: Kassie Ozell SAUNDERS, MD;  Location: [facility] ORS;  Service: Urology;  Laterality: Left;   INCISIONAL HERNIA REPAIR N/A 06/01/2019   Procedure: LAPAROSCOPIC INCISIONAL HERNIA REPAIR WITH MESH;  Surgeon: Rodolph Romano, MD;  Location: [facility] ORS;  Service: General;  Laterality: N/A;   POLYPECTOMY  06/03/2023   Procedure: POLYPECTOMY;  Surgeon: Jungling Elspeth Ozell, DO;  Location: [facility] ENDOSCOPY;  Service: Gastroenterology;;    Family History No h/o GI disease or malignancy  Review of Systems  Constitutional:  Negative for activity change, appetite change, chills, diaphoresis, fatigue, fever and unexpected weight change.  HENT:  Negative for trouble swallowing and voice change.   Respiratory:  Negative for shortness of breath and wheezing.   Cardiovascular:  Negative for chest pain, palpitations and leg swelling.  Gastrointestinal:  Negative for abdominal distention, abdominal pain, anal bleeding, blood in stool, constipation, diarrhea, nausea, rectal pain and vomiting.  Musculoskeletal:  Negative for arthralgias and myalgias.  Skin:  Negative for color change and pallor.  Neurological:  Negative for dizziness, syncope and weakness.  Psychiatric/Behavioral:  Negative for confusion.   All other systems reviewed and are negative.    Medications No current facility-administered medications on file prior to encounter.   Current Outpatient Medications on File Prior to Encounter  Medication Sig Dispense Refill   ARMOUR THYROID  60 MG tablet Take 60 mg by mouth daily.     atorvastatin  (LIPITOR) 80 MG tablet Take 1 tablet (80 mg total) by mouth daily. 90 tablet 3  buPROPion  (WELLBUTRIN  SR) 200 MG 12 hr tablet Take 200 mg by mouth 2 (two) times daily.     chlorthalidone (HYGROTON) 25 MG tablet Take 25 mg by mouth daily.     Cholecalciferol  (VITAMIN D3) 125 MCG (5000 UT) CAPS  Take 5,000 Units by mouth daily.     clonazePAM (KLONOPIN) 0.5 MG tablet Take 0.5 mg by mouth daily.     cyanocobalamin  1000 MCG tablet Take 1,000 mcg by mouth daily.     esomeprazole  (NEXIUM ) 40 MG capsule Take 1 capsule (40 mg total) by mouth daily. 90 capsule 1   gabapentin  (NEURONTIN ) 300 MG capsule Take 1 capsule (300 mg total) by mouth daily. 90 capsule 1   loratadine  (CLARITIN ) 10 MG tablet Take 10 mg by mouth daily.     losartan (COZAAR) 100 MG tablet Take 100 mg by mouth daily.     progesterone (PROMETRIUM) 200 MG capsule Take 200 mg by mouth at bedtime.     pyridOXINE  (VITAMIN B-6) 50 MG tablet Take 50 mg by mouth 2 (two) times daily.     rOPINIRole  (REQUIP ) 0.25 MG tablet Take 1 tablet (0.25 mg total) by mouth at bedtime. 90 tablet 1   Testosterone (TESTOPEL) 75 MG PLLT by implant route.     acetaminophen  (TYLENOL ) 325 MG tablet Take 325-650 mg by mouth every 6 (six) hours as needed for mild pain or fever.      Pertinent medications related to GI and procedure were reviewed by me with the patient prior to the procedure   Current Facility-Administered Medications:    0.9 %  sodium chloride  infusion, , Intravenous, Continuous, Onita Elspeth Sharper, DO, Last Rate: 20 mL/hr at 07/20/24 1234, New Bag at 07/20/24 1234  sodium chloride  20 mL/hr at 07/20/24 1234       Allergies  Allergen Reactions   Aciphex  [Rabeprazole ] Swelling   Bactrim [Sulfamethoxazole-Trimethoprim ] Diarrhea and Nausea Only   Ceftin [Cefuroxime Axetil] Hives   Protonix  [Pantoprazole ] Hives and Swelling   Vilazodone Hcl Hives and Other (See Comments)    Tongue swelling   Allergies were reviewed by me prior to the procedure  Objective   Body mass index is 42.09 kg/m. Vitals:   07/20/24 1220  BP: 126/75  Pulse: 85  Resp: 16  Temp: (!) 97.1 F (36.2 C)  TempSrc: Temporal  SpO2: 99%  Weight: 107.8 kg  Height: 5' 3 (1.6 m)     Physical Exam Vitals and nursing note reviewed.  Constitutional:       General: She is not in acute distress.    Appearance: Normal appearance. She is obese. She is not ill-appearing, toxic-appearing or diaphoretic.  HENT:     Head: Normocephalic and atraumatic.     Nose: Nose normal.     Mouth/Throat:     Mouth: Mucous membranes are moist.     Pharynx: Oropharynx is clear.  Eyes:     General: No scleral icterus.    Extraocular Movements: Extraocular movements intact.  Cardiovascular:     Rate and Rhythm: Normal rate and regular rhythm.     Heart sounds: Normal heart sounds. No murmur heard.    No friction rub. No gallop.  Pulmonary:     Effort: Pulmonary effort is normal. No respiratory distress.     Breath sounds: Normal breath sounds. No wheezing, rhonchi or rales.  Abdominal:     General: Bowel sounds are normal. There is no distension.     Palpations: Abdomen is soft.  Tenderness: There is no abdominal tenderness. There is no guarding or rebound.  Musculoskeletal:     Cervical back: Neck supple.     Right lower leg: No edema.     Left lower leg: No edema.  Skin:    General: Skin is warm and dry.     Coloration: Skin is not jaundiced or pale.  Neurological:     General: No focal deficit present.     Mental Status: She is alert and oriented to person, place, and time. Mental status is at baseline.  Psychiatric:        Mood and Affect: Mood normal.        Behavior: Behavior normal.        Thought Content: Thought content normal.        Judgment: Judgment normal.      Assessment:  Ms. Brittany Cain is a 63 y.o. female  who presents today for Esophagogastroduodenoscopy and Colonoscopy for Worsening reflux, personal history of colon polyp .  Plan:  Esophagogastroduodenoscopy and Colonoscopy with possible intervention today  Esophagogastroduodenoscopy and Colonoscopy with possible biopsy, control of bleeding, polypectomy, and interventions as necessary has been discussed with the patient/patient representative. Informed consent  was obtained from the patient/patient representative after explaining the indication, nature, and risks of the procedure including but not limited to death, bleeding, perforation, missed neoplasm/lesions, cardiorespiratory compromise, and reaction to medications. Opportunity for questions was given and appropriate answers were provided. Patient/patient representative has verbalized understanding is amenable to undergoing the procedure.   Elspeth Ozell Jungling, DO  [facility] Gastroenterology  Portions of the record may have been created with voice recognition software. Occasional wrong-word or 'sound-a-like' substitutions may have occurred due to the inherent limitations of voice recognition software.  Read the chart carefully and recognize, using context, where substitutions may have occurred.

## 2024-07-20 NOTE — Anesthesia Postprocedure Evaluation (Signed)
 Anesthesia Post Note  Patient: Brittany Cain  Procedure(s) Performed: COLONOSCOPY EGD (ESOPHAGOGASTRODUODENOSCOPY)  Patient location during evaluation: PACU Anesthesia Type: General Level of consciousness: awake and alert Pain management: pain level controlled Vital Signs Assessment: post-procedure vital signs reviewed and stable Respiratory status: spontaneous breathing Cardiovascular status: stable Anesthetic complications: no   No notable events documented.   Last Vitals:  Vitals:   07/20/24 1220 07/20/24 1332  BP: 126/75 96/84  Pulse: 85   Resp: 16 20  Temp: (!) 36.2 C 36.9 C  SpO2: 99%     Last Pain:  Vitals:   07/20/24 1332  TempSrc: Temporal  PainSc: Asleep                 VAN STAVEREN,Coleen Cardiff

## 2024-07-20 NOTE — Interval H&P Note (Signed)
 History and Physical Interval Note: Preprocedure H&P from 07/20/24  was reviewed and there was no interval change after seeing and examining the patient.  Written consent was obtained from the patient after discussion of risks, benefits, and alternatives. Patient has consented to proceed with Esophagogastroduodenoscopy and Colonoscopy with possible intervention   07/20/2024 12:56 PM  Brittany Cain  has presented today for surgery, with the diagnosis of K21.9 (ICD-10-CM) - Gastroesophageal reflux disease, unspecified whether esophagitis present Z86.0101 (ICD-10-CM) - History of adenomatous polyp of colon.  The various methods of treatment have been discussed with the patient and family. After consideration of risks, benefits and other options for treatment, the patient has consented to  Procedure(s): COLONOSCOPY (N/A) EGD (ESOPHAGOGASTRODUODENOSCOPY) (N/A) as a surgical intervention.  The patient's history has been reviewed, patient examined, no change in status, stable for surgery.  I have reviewed the patient's chart and labs.  Questions were answered to the patient's satisfaction.     Elspeth Ozell Jungling

## 2024-07-20 NOTE — Anesthesia Preprocedure Evaluation (Signed)
 Anesthesia Evaluation  Patient identified by MRN, date of birth, ID band Patient awake    Reviewed: Allergy & Precautions, NPO status , Patient's Chart, lab work & pertinent test results  Airway Mallampati: III  TM Distance: >3 FB Neck ROM: full    Dental  (+) Teeth Intact, Caps   Pulmonary neg pulmonary ROS   Pulmonary exam normal breath sounds clear to auscultation       Cardiovascular Exercise Tolerance: Good hypertension, Pt. on medications negative cardio ROS Normal cardiovascular exam Rhythm:Regular Rate:Normal     Neuro/Psych   Anxiety     negative neurological ROS  negative psych ROS   GI/Hepatic negative GI ROS, Neg liver ROS,GERD  Medicated,,  Endo/Other  negative endocrine ROSHypothyroidism  Class 3 obesity  Renal/GU negative Renal ROS  negative genitourinary   Musculoskeletal negative musculoskeletal ROS (+)    Abdominal  (+) + obese  Peds negative pediatric ROS (+)  Hematology negative hematology ROS (+)   Anesthesia Other Findings Past Medical History: No date: Anxiety No date: Chicken pox No date: Chronic back pain No date: Chronic kidney disease No date: Depression No date: GERD (gastroesophageal reflux disease) No date: History of kidney stones No date: Hyperlipidemia No date: Hypertension No date: Obesity No date: Restless leg syndrome  Past Surgical History: No date: ABDOMINAL HYSTERECTOMY No date: BACK SURGERY     Comment:  lower back; metal in lower back No date: CHOLECYSTECTOMY 06/03/2023: COLONOSCOPY WITH PROPOFOL ; N/A     Comment:  Procedure: COLONOSCOPY WITH PROPOFOL ;  Surgeon: Onita Elspeth Sharper, DO;  Location: [facility] ENDOSCOPY;  Service:               Gastroenterology;  Laterality: N/A; 10/31/2019: CYSTOSCOPY/URETEROSCOPY/HOLMIUM LASER/STENT PLACEMENT;  Left     Comment:  Procedure: ,left stent placement,left retrograde               pyelogram,cystoscopy;   Surgeon: Kassie Sharper SAUNDERS, MD;                Location: [facility] ORS;  Service: Urology;  Laterality: Left; 05/24/2017: ESOPHAGOGASTRODUODENOSCOPY (EGD) WITH PROPOFOL ; N/A     Comment:  Procedure: ESOPHAGOGASTRODUODENOSCOPY (EGD) WITH               PROPOFOL ;  Surgeon: Gaylyn Gladis PENNER, MD;  Location:               [facility] ENDOSCOPY;  Service: Endoscopy;  Laterality: N/A; 10/29/2019: EXTRACORPOREAL SHOCK WAVE LITHOTRIPSY; Left     Comment:  Procedure: EXTRACORPOREAL SHOCK WAVE LITHOTRIPSY (ESWL);              Surgeon: Kassie Sharper SAUNDERS, MD;  Location: [facility] ORS;                Service: Urology;  Laterality: Left; 06/01/2019: INCISIONAL HERNIA REPAIR; N/A     Comment:  Procedure: LAPAROSCOPIC INCISIONAL HERNIA REPAIR WITH               MESH;  Surgeon: Rodolph Romano, MD;  Location:               [facility] ORS;  Service: General;  Laterality: N/A; 06/03/2023: POLYPECTOMY     Comment:  Procedure: POLYPECTOMY;  Surgeon: Onita Elspeth Sharper,              DO;  Location: [facility] ENDOSCOPY;  Service:  Gastroenterology;;  BMI    Body Mass Index: 42.09 kg/m      Reproductive/Obstetrics negative OB ROS                              Anesthesia Physical Anesthesia Plan  ASA: 3  Anesthesia Plan: General   Post-op Pain Management:    Induction: Intravenous  PONV Risk Score and Plan: Propofol  infusion and TIVA  Airway Management Planned: Natural Airway and Nasal Cannula  Additional Equipment:   Intra-op Plan:   Post-operative Plan:   Informed Consent: I have reviewed the patients History and Physical, chart, labs and discussed the procedure including the risks, benefits and alternatives for the proposed anesthesia with the patient or authorized representative who has indicated his/her understanding and acceptance.     Dental Advisory Given  Plan Discussed with: CRNA and Surgeon  Anesthesia Plan Comments:         Anesthesia Quick Evaluation

## 2024-07-20 NOTE — Op Note (Signed)
 [facility] Gastroenterology Patient Name: Brittany Cain Procedure Date: 07/20/2024 12:47 PM MRN: [id number] Account #: [id number] Date of Birth: [birth year] Admit Type: Outpatient Age: 63 Room: [facility] ENDO ROOM 2 Gender: Female Note Status: Finalized Instrument Name: Upper Endoscope [id number] Procedure:             Upper GI endoscopy Indications:           Follow-up of gastro-esophageal reflux disease Providers:             Elspeth Ozell Jungling DO, DO Medicines:             Monitored Anesthesia Care Complications:         No immediate complications. Estimated blood loss:                         Minimal. Procedure:             Pre-Anesthesia Assessment:                        - Prior to the procedure, a History and Physical was                         performed, and patient medications and allergies were                         reviewed. The patient is competent. The risks and                         benefits of the procedure and the sedation options and                         risks were discussed with the patient. All questions                         were answered and informed consent was obtained.                         Patient identification and proposed procedure were                         verified by the physician, the nurse, the anesthetist                         and the technician in the endoscopy suite. Mental                         Status Examination: alert and oriented. Airway                         Examination: normal oropharyngeal airway and neck                         mobility. Respiratory Examination: clear to                         auscultation. CV Examination: RRR, no murmurs, no S3                         or S4. Prophylactic Antibiotics: The  patient does not                         require prophylactic antibiotics. Prior                         Anticoagulants: The patient has taken no anticoagulant                         or antiplatelet  agents. ASA Grade Assessment: III - A                         patient with severe systemic disease. After reviewing                         the risks and benefits, the patient was deemed in                         satisfactory condition to undergo the procedure. The                         anesthesia plan was to use monitored anesthesia care                         (MAC). Immediately prior to administration of                         medications, the patient was re-assessed for adequacy                         to receive sedatives. The heart rate, respiratory                         rate, oxygen saturations, blood pressure, adequacy of                         pulmonary ventilation, and response to care were                         monitored throughout the procedure. The physical                         status of the patient was re-assessed after the                         procedure.                        After obtaining informed consent, the endoscope was                         passed under direct vision. Throughout the procedure,                         the patient's blood pressure, pulse, and oxygen                         saturations were monitored continuously. The Endoscope  was introduced through the mouth, and advanced to the                         second part of duodenum. The upper GI endoscopy was                         accomplished without difficulty. The patient tolerated                         the procedure well. Findings:      The duodenal bulb, first portion of the duodenum and second portion of       the duodenum were normal. Estimated blood loss: none.      The entire examined stomach was normal. Estimated blood loss: none.      Esophagogastric landmarks were identified: the gastroesophageal junction       was found at 35 cm from the incisors.      The Z-line was irregular and was found with one tongue of salmon mucosa       extending above the  z line. Biopsies were taken with a cold forceps for       histology. Estimated blood loss was minimal.      The exam of the esophagus was otherwise normal. Impression:            - Normal duodenal bulb, first portion of the duodenum                         and second portion of the duodenum.                        - Normal stomach.                        - Esophagogastric landmarks identified.                        - Z-line irregular, with one tongue of salmon mucosa                         extending above the z line. Biopsied. Recommendation:        - Patient has a contact number available for                         emergencies. The signs and symptoms of potential                         delayed complications were discussed with the patient.                         Return to normal activities tomorrow. Written                         discharge instructions were provided to the patient.                        - Discharge patient to home.                        - Resume previous diet.                        -  Continue present medications.                        - Await pathology results.                        - Return to GI clinic as previously scheduled.                        - The findings and recommendations were discussed with                         the patient. Procedure Code(s):     --- Professional ---                        [phone], Esophagogastroduodenoscopy, flexible,                         transoral; with biopsy, single or multiple Diagnosis Code(s):     --- Professional ---                        K22.89, Other specified disease of esophagus                        K21.9, Gastro-esophageal reflux disease without                         esophagitis CPT copyright 2022 [employer]. All rights reserved. The codes documented in this report are preliminary and upon coder review may  be revised to meet current compliance requirements. Attending Participation:       I personally performed the entire procedure. Elspeth Jungling, DO Elspeth Ozell Jungling DO, DO 07/20/2024 1:12:09 PM This report has been signed electronically. Number of Addenda: 0 Note Initiated On: 07/20/2024 12:47 PM Estimated Blood Loss:  Estimated blood loss was minimal.      [facility]

## 2024-07-21 ENCOUNTER — Encounter: Payer: Self-pay | Admitting: Gastroenterology

## 2024-07-23 LAB — SURGICAL PATHOLOGY

## 2024-08-11 ENCOUNTER — Ambulatory Visit: Admitting: Internal Medicine

## 2024-08-11 ENCOUNTER — Encounter: Payer: Self-pay | Admitting: Internal Medicine

## 2024-08-11 VITALS — BP 128/84 | Ht 63.0 in | Wt 238.6 lb

## 2024-08-11 DIAGNOSIS — L989 Disorder of the skin and subcutaneous tissue, unspecified: Secondary | ICD-10-CM

## 2024-08-11 NOTE — Patient Instructions (Signed)
 Actinic Keratosis An actinic keratosis is a precancerous growth on the skin. If there is more than one, the condition is called actinic keratoses. These growths appear most often on parts of the skin that get a lot of sun exposure, including the: Scalp. Face. Ears. Lips. Upper back. Forearms. Backs of the hands. If left untreated, these growths may develop into a skin cancer called squamous cell carcinoma. It is important to have all these growths checked by a health care provider to determine the best treatment. What are the causes? Actinic keratoses are caused by getting too much ultraviolet (UV) radiation from the sun or other UV light sources. What increases the risk? You are more likely to develop this condition if you: Have light-colored skin or blue eyes. Have blond or red hair. Spend a lot of time in the sun. Do not protect your skin from the sun when outdoors. Are an older person. The risk of developing an actinic keratosis increases with age. What are the signs or symptoms? These growths feel like scaly, rough spots of skin. Symptoms of this condition include growths that may: Be as small as a pinhead or as big as a quarter. Itch, hurt, or feel sensitive. Be skin-colored, light tan, dark tan, pink, or a combination of these colors. In most cases, the growths become red. Have a small piece of pink or gray skin (skin tag) growing from them. It may be easier to notice the growths by feeling them rather than seeing them. Sometimes, actinic keratoses disappear but may return a few days to a few weeks later. How is this diagnosed? This condition is usually diagnosed with a physical exam. A tissue sample may be removed from the growth and examined under a microscope (biopsy). How is this treated? This condition may be treated by: Scraping off the actinic keratosis (curettage). Freezing the actinic keratosis with liquid nitrogen (cryosurgery). This causes the growth to eventually  fall off. Applying medicated creams or gels to destroy the cells in the growth. Applying chemicals to the growth to make the outer layers of skin peel off (chemical peel). Using photodynamic therapy. In this procedure, medicated cream is applied to the actinic keratosis. This cream increases your skin's sensitivity to light. Then, a strong light is aimed at the actinic keratosis to destroy cells in the growth. Follow these instructions at home: Skin care Apply cool, wet cloths (coolcompresses) to the affected areas. Do not scratch your skin. Check your skin regularly for any growths, especially ones that: Start to itch or bleed. Change in size, shape, or color. Caring for the treated area Keep the treated area clean and dry as told by your health care provider. Do not apply any medicine, cream, or lotion to the treated area unless your health care provider tells you to do that. Do not pick at blisters or try to break them open. This can cause infection and scarring. If you have red or irritated skin after treatment, follow instructions from your health care provider about how to take care of the treated area. Make sure you: Wash your hands with soap and water for at least 20 seconds before and after you change your bandage (dressing). If soap and water are not available, use hand sanitizer. Change your dressing as told by your health care provider. If you have red or irritated skin after treatment, check the treated area every day for signs of infection. Check for: Redness, swelling, or pain. Fluid or blood. Warmth. Pus or a bad  smell. Lifestyle Do not use any products that contain nicotine or tobacco. These products include cigarettes, chewing tobacco, and vaping devices, such as e-cigarettes. If you need help quitting, ask your health care provider. Take steps to protect your skin from the sun, such as: Avoiding the sun between 10:00 a.m. and 4:00 p.m. This is when the UV light is the  strongest. Using a sunscreen or sunblock with SPF 30 or greater. Applying sunscreen before you are exposed to sunlight and reapplying as often as told by the instructions on the sunscreen container. Wearing protective gear, including: Sunglasses with UV protection. A hat and clothing that protect your skin from sunlight. Avoiding medicines that increase your sensitivity to sunlight when possible. Avoidingtanning beds and other indoor tanning devices. General instructions Take or apply over-the-counter and prescription medicines only as told by your health care provider. Return to your normal activities as told by your health care provider. Ask your health care provider what activities are safe for you. Have a skin exam done every year by a health care provider who is a skin specialist (dermatologist). Keep all follow-up visits. Your health care provider will want to check that the site has healed after treatment. Contact a health care provider if: You notice any changes or new growths on your skin. You have swelling, pain, or redness around your treated area. You have fluid or blood coming from your treated area. Your treated area feels warm to the touch. You have pus or a bad smell coming from your treated area. You have a fever or chills. You have a blister that becomes large and painful. Summary An actinic keratosis is a precancerous growth on the skin.If left untreated, these growths can develop into skin cancer. Check your skin regularly for any growths, especially growths that start to itch or bleed, or change in size, shape, or color. Take steps to protect your skin from the sun. Contact a health care provider if you notice any changes or new growths on your skin. This information is not intended to replace advice given to you by your health care provider. Make sure you discuss any questions you have with your health care provider. Document Revised: 02/15/2022 Document Reviewed:  02/15/2022 Elsevier Patient Education  2024 [employer].

## 2024-08-11 NOTE — Progress Notes (Signed)
 Subjective:    Patient ID: Brittany Cain, female    DOB: May 01, 1961, 63 y.o.   MRN: [id number]  HPI  Discussed the use of AI scribe software for clinical note transcription with the patient, who gave verbal consent to proceed.  Brittany Cain is a 63 year old female who presents with a scaly spot on her back.  She has a scaly spot on her back, which she noticed after using a new back scratcher.   She has a history of sunburns on her back. She recalls having another spot checked several months ago by her regular doctor, who did not find it concerning. She does not have a history of skin cancer and has not seen a dermatologist previously.       Review of Systems   Past Medical History:  Diagnosis Date   Anxiety    Chicken pox    Chronic back pain    Chronic kidney disease    Depression    GERD (gastroesophageal reflux disease)    History of kidney stones    Hyperlipidemia    Hypertension    Obesity    Restless leg syndrome     Current Outpatient Medications  Medication Sig Dispense Refill   acetaminophen  (TYLENOL ) 325 MG tablet Take 325-650 mg by mouth every 6 (six) hours as needed for mild pain or fever.     ARMOUR THYROID  60 MG tablet Take 60 mg by mouth daily.     atorvastatin  (LIPITOR) 80 MG tablet Take 1 tablet (80 mg total) by mouth daily. 90 tablet 3   buPROPion  (WELLBUTRIN  SR) 200 MG 12 hr tablet Take 200 mg by mouth 2 (two) times daily.     chlorthalidone (HYGROTON) 25 MG tablet Take 25 mg by mouth daily.     Cholecalciferol  (VITAMIN D3) 125 MCG (5000 UT) CAPS Take 5,000 Units by mouth daily.     clonazePAM (KLONOPIN) 0.5 MG tablet Take 0.5 mg by mouth daily.     cyanocobalamin  1000 MCG tablet Take 1,000 mcg by mouth daily.     esomeprazole  (NEXIUM ) 40 MG capsule Take 1 capsule (40 mg total) by mouth daily. 90 capsule 1   gabapentin  (NEURONTIN ) 300 MG capsule Take 1 capsule (300 mg total) by mouth daily. 90 capsule 1   loratadine  (CLARITIN ) 10 MG tablet  Take 10 mg by mouth daily.     losartan (COZAAR) 100 MG tablet Take 100 mg by mouth daily.     progesterone (PROMETRIUM) 200 MG capsule Take 200 mg by mouth at bedtime.     pyridOXINE  (VITAMIN B-6) 50 MG tablet Take 50 mg by mouth 2 (two) times daily.     rOPINIRole  (REQUIP ) 0.25 MG tablet Take 1 tablet (0.25 mg total) by mouth at bedtime. 90 tablet 1   Testosterone (TESTOPEL) 75 MG PLLT by implant route.     No current facility-administered medications for this visit.    Allergies  Allergen Reactions   Aciphex  [Rabeprazole ] Swelling   Bactrim [Sulfamethoxazole-Trimethoprim ] Diarrhea and Nausea Only   Ceftin [Cefuroxime Axetil] Hives   Protonix  [Pantoprazole ] Hives and Swelling   Vilazodone Hcl Hives and Other (See Comments)    Tongue swelling    Family History  Problem Relation Age of Onset   Stroke Mother    Pancreatic disease Mother    Prostate cancer Father    Lung cancer Father    Breast cancer Neg Hx     Social History   Socioeconomic History   Marital  status: Married    Spouse name: Not on file   Number of children: Not on file   Years of education: Not on file   Highest education level: Not on file  Occupational History   Not on file  Tobacco Use   Smoking status: Never   Smokeless tobacco: Never  Vaping Use   Vaping status: Never Used  Substance and Sexual Activity   Alcohol use: No   Drug use: No   Sexual activity: Not Currently  Other Topics Concern   Not on file  Social History Narrative   Not on file   Social Drivers of Health   Financial Resource Strain: Low Risk  (12/16/2023)   Received from [facility]   Overall Financial Resource Strain (CARDIA)    Difficulty of Paying Living Expenses: Not hard at all  Food Insecurity: No Food Insecurity (12/16/2023)   Received from [facility]   Hunger Vital Sign    Within the past 12 months, you worried that your food would run out before you got the money to buy  more.: Never true    Within the past 12 months, the food you bought just didn't last and you didn't have money to get more.: Never true  Transportation Needs: No Transportation Needs (12/16/2023)   Received from [facility] - Transportation    In the past 12 months, has lack of transportation kept you from medical appointments or from getting medications?: No    Lack of Transportation (Non-Medical): No  Physical Activity: Not on file  Stress: Not on file  Social Connections: Not on file  Intimate Partner Violence: Not on file     Constitutional: Pt reports fatigue. Denies fever, malaise, headache or abrupt weight changes.  Respiratory: Denies difficulty breathing, shortness of breath, cough or sputum production.   Cardiovascular: Denies chest pain, chest tightness, palpitations or swelling in the hands or feet.  Skin: Patient reports skin lesion of back.  Denies redness, rashes, or ulcercations.    No other specific complaints in a complete review of systems (except as listed in HPI above).      Objective:   Physical Exam  BP 128/84 (BP Location: Left Arm, Patient Position: Sitting, Cuff Size: Normal)   Ht 5' 3 (1.6 m)   Wt 238 lb 9.6 oz (108.2 kg)   BMI 42.27 kg/m    Wt Readings from Last 3 Encounters:  07/20/24 237 lb 9.6 oz (107.8 kg)  07/08/24 247 lb 4 oz (112.2 kg)  06/04/24 241 lb 6.4 oz (109.5 kg)    General: Appears her stated age, obese, in NAD. Skin: Warm, dry and intact.  4 mm raised cauliflower-like lesion noted of the central portion of the upper back.   Cardiovascular: Normal rate and rhythm.  Pulmonary/Chest: Normal effort and positive vesicular breath sounds.  Neurological: Alert and oriented.   BMET    Component Value Date/Time   NA 139 06/04/2024 1043   NA 139 04/23/2014 1234   K 3.8 06/04/2024 1043   K 3.9 04/23/2014 1234   CL 101 06/04/2024 1043   CL 107 04/23/2014 1234   CO2 29 06/04/2024 1043   CO2 30  04/23/2014 1234   GLUCOSE 102 06/04/2024 1043   GLUCOSE 90 04/23/2014 1234   BUN 9 06/04/2024 1043   BUN 13 04/23/2014 1234   CREATININE 0.88 06/04/2024 1043   CALCIUM  10.0 06/04/2024 1043   CALCIUM  9.2 04/23/2014 1234   GFRNONAA >  60 02/04/2022 1552   GFRNONAA >60 04/23/2014 1234   GFRAA 50 (L) 10/31/2019 1032   GFRAA >60 04/23/2014 1234    Lipid Panel     Component Value Date/Time   CHOL 202 (H) 06/04/2024 1043   TRIG 258 (H) 06/04/2024 1043   HDL 33 (L) 06/04/2024 1043   CHOLHDL 6.1 (H) 06/04/2024 1043   LDLCALC 129 (H) 06/04/2024 1043    CBC    Component Value Date/Time   WBC 8.6 06/04/2024 1043   RBC 4.87 06/04/2024 1043   HGB 15.0 06/04/2024 1043   HGB 12.5 04/23/2014 1234   HCT 46.5 (H) 06/04/2024 1043   HCT 37.2 04/23/2014 1234   PLT 352 06/04/2024 1043   PLT 294 04/23/2014 1234   MCV 95.5 06/04/2024 1043   MCV 88 04/23/2014 1234   MCH 30.8 06/04/2024 1043   MCHC 32.3 06/04/2024 1043   RDW 12.2 06/04/2024 1043   RDW 13.2 04/23/2014 1234   LYMPHSABS 2.1 10/17/2020 0414   MONOABS 1.2 (H) 10/17/2020 0414   EOSABS 0.0 10/17/2020 0414   BASOSABS 0.0 10/17/2020 0414    Hgb A1C Lab Results  Component Value Date   HGBA1C 5.5 06/04/2024            Assessment & Plan:   Assessment and Plan    Skin lesion on back Lesion 3-4 mm, raised, scaly, red. Differential: actinic keratosis, skin cancer, skin tag. No bleeding.  - Refer to dermatologist Dr. Rexene Rattler for evaluation. - Advised to avoid scratching and not apply substances.     RTC in 4 months for your annual exam Angeline Laura, NP

## 2024-08-25 ENCOUNTER — Ambulatory Visit

## 2024-08-25 DIAGNOSIS — D492 Neoplasm of unspecified behavior of bone, soft tissue, and skin: Secondary | ICD-10-CM

## 2024-08-25 DIAGNOSIS — L821 Other seborrheic keratosis: Secondary | ICD-10-CM | POA: Diagnosis not present

## 2024-08-25 DIAGNOSIS — D1801 Hemangioma of skin and subcutaneous tissue: Secondary | ICD-10-CM | POA: Diagnosis not present

## 2024-08-25 DIAGNOSIS — W908XXA Exposure to other nonionizing radiation, initial encounter: Secondary | ICD-10-CM

## 2024-08-25 DIAGNOSIS — D485 Neoplasm of uncertain behavior of skin: Secondary | ICD-10-CM | POA: Diagnosis not present

## 2024-08-25 DIAGNOSIS — L814 Other melanin hyperpigmentation: Secondary | ICD-10-CM | POA: Diagnosis not present

## 2024-08-25 DIAGNOSIS — C44529 Squamous cell carcinoma of skin of other part of trunk: Secondary | ICD-10-CM | POA: Diagnosis not present

## 2024-08-25 DIAGNOSIS — C4492 Squamous cell carcinoma of skin, unspecified: Secondary | ICD-10-CM

## 2024-08-25 DIAGNOSIS — L578 Other skin changes due to chronic exposure to nonionizing radiation: Secondary | ICD-10-CM

## 2024-08-25 DIAGNOSIS — D229 Melanocytic nevi, unspecified: Secondary | ICD-10-CM

## 2024-08-25 DIAGNOSIS — L918 Other hypertrophic disorders of the skin: Secondary | ICD-10-CM

## 2024-08-25 HISTORY — DX: Squamous cell carcinoma of skin, unspecified: C44.92

## 2024-08-25 NOTE — Progress Notes (Addendum)
 New Patient Visit   Subjective  Brittany Cain is a 63 y.o. female who presents for the following: Lesion at upper back present for about 4 weeks, reports it is itchy and irritated. No personal or family hx of skin cancer. Patient reports her PCP looked at it and wanted patient to have it checked.  Patient also wanting some places looked at left axilla that get irritated with clothing and when shaving.   The following portions of the chart were reviewed this encounter and updated as appropriate: medications, allergies, medical history  Review of Systems:  No other skin or systemic complaints except as noted in HPI or Assessment and Plan.  Objective  Well appearing patient in no apparent distress; mood and affect are within normal limits.  A focused examination was performed of the following areas: waist up skin exam  - Angioma(s): Scattered red vascular papule(s) on the trunk - Lentigo/lentigines: Scattered pigmented macules that are tan to brown in color and are somewhat non-uniform in shape and concentrated in the sun-exposed areas of the trunk and upper extremities - Nevus/nevi: Scattered well-demarcated, regular, pigmented macule(s) and/or papule(s) on the scattered diffusely - Seborrheic Keratosis(es): Stuck-on appearing keratotic papule(s) on the trunk, some  irritated with redness, crusting, edema, and/or partial avulsion - Actinic Damage/Elastosis: chronic sun damage: dyspigmentation, telangiectasia, and wrinkling - Acrochordon(s): soft, fleshy, skin-colored to tan pedunculated papules located on the L axilla x 6    Relevant exam findings are noted in the Assessment and Plan.  Left Upper Back 5 mm hyperkeratotic papule    Assessment & Plan   SKIN CANCER SCREENING PERFORMED TODAY.  BENIGN SKIN FINDINGS  - Lentigines  - Seborrheic keratoses  - Hemangiomas   - Nevus/Multiple Benign Nevi - Reassurance provided regarding the benign appearance of lesions noted on exam  today; no treatment is indicated in the absence of symptoms/changes. - Reinforced importance of photoprotective strategies including liberal and frequent sunscreen use of a broad-spectrum SPF 30 or greater, use of protective clothing, and sun avoidance for prevention of cutaneous malignancy and photoaging.  Counseled patient on the importance of regular self-skin monitoring as well as routine clinical skin examinations as scheduled.   ACTINIC DAMAGE - Chronic condition, secondary to cumulative UV/sun exposure - Recommend daily broad spectrum sunscreen SPF 30+ to sun-exposed areas, reapply every 2 hours as needed.  - Staying in the shade or wearing long sleeves, sun glasses (UVA+UVB protection) and wide brim hats (4-inch brim around the entire circumference of the hat) are also recommended for sun protection.  - Call for new or changing lesions.  Acrochordons - Benign, patient reassured of the benign nature of acrochrodons - Explained that more lesions may appear over time - Patient elected to treat these lesions with snip removal - COSMETIC / OUT OF POCKET DO NOT BILL INSURANCE   Acrochordons  Removal (Shave) Procedure Note: After discussion of risks, benefits, alternatives, patient agreed to procedure and verbal consent was obtained. The area was marked, photographed, prepped with alcohol, and anesthetized with lidocaine  2% with epinephrine . Removal was performed using a snip technique. Hemostasis was achieved with pressure and aluminum chloride. Bandage was applied and instructions were provided. Patient will be informed of results via their preferred communication method. Lesion A: Location: L axilla x 6 NEOPLASM OF SKIN Left Upper Back Skin / nail biopsy Type of biopsy: tangential   Informed consent: discussed and consent obtained   Timeout: patient name, date of birth, surgical site, and procedure verified  Procedure prep:  Patient was prepped and draped in usual sterile fashion Prep  type:  Isopropyl alcohol Anesthesia: the lesion was anesthetized in a standard fashion   Anesthetic:  1% lidocaine  w/ epinephrine  1-100,000 buffered w/ 8.4% NaHCO3 Instrument used: DermaBlade   Hemostasis achieved with: pressure and aluminum chloride   Outcome: patient tolerated procedure well   Post-procedure details: sterile dressing applied and wound care instructions given   Dressing type: bandage and petrolatum    Specimen 1 - Surgical pathology Differential Diagnosis: R/o SK vs SCC  Check Margins: No 5 mm hyperkeratotic papule LENTIGO   SEBORRHEIC KERATOSIS   CHERRY ANGIOMA   MULTIPLE BENIGN NEVI   ACTINIC SKIN DAMAGE   SKIN TAG    Return for TBSE, w/ Dr. Raymund.  I, Jacquelynn V. Wilfred, CMA, am acting as scribe for Lauraine JAYSON Raymund, MD .   Documentation: I have reviewed the above documentation for accuracy and completeness, and I agree with the above.  Lauraine JAYSON Raymund, MD

## 2024-08-25 NOTE — Patient Instructions (Addendum)
 The bandage should remain in place until tomorrow. Remove the bandages and clean the wound once daily as follows:  - Wash your hands. Clean the wound gently with soap and water, and then pat dry. Do not rub. Apply a small amount of Petroleum Jelly or Vaseline. Cover the area with a Band-Aid.  A small amount of bleeding is normal. If bleeding persists, apply firm pressure over the bandage for 5 to 10 minutes without interruption. If bleeding continues, call our office. Continue to clean the area as directed above until the wound is healed. Shave biopsies may take several weeks to heal. It is normal if the edges are pink/red and the center is slight yellowish or white in color. However if the site becomes hot, swollen, has a thick drainage or redness that expands away from the site please call us . We will contact you with results once available.    Due to recent changes in healthcare laws, you may see results of your pathology and/or laboratory studies on MyChart before the doctors have had a chance to review them. We understand that in some cases there may be results that are confusing or concerning to you. Please understand that not all results are received at the same time and often the doctors may need to interpret multiple results in order to provide you with the best plan of care or course of treatment. Therefore, we ask that you please give us  2 business days to thoroughly review all your results before contacting the office for clarification. Should we see a critical lab result, you will be contacted sooner.   If You Need Anything After Your Visit  If you have any questions or concerns for your doctor, please call our main line at [phone] and press option 4 to reach your doctor's medical assistant. If no one answers, please leave a voicemail as directed and we will return your call as soon as possible. Messages left after 4 pm will be answered the following business day.   You may also send us  a  message via MyChart. We typically respond to MyChart messages within 1-2 business days.  For prescription refills, please ask your pharmacy to contact our office. Our fax number is [phone].  If you have an urgent issue when the clinic is closed that cannot wait until the next business day, you can page your doctor at the number below.    Please note that while we do our best to be available for urgent issues outside of office hours, we are not available 24/7.   If you have an urgent issue and are unable to reach us , you may choose to seek medical care at your doctor's office, retail clinic, urgent care center, or emergency room.  If you have a medical emergency, please immediately call 911 or go to the emergency department.  Pager Numbers  - Dr. Hester: [phone]  - Dr. Jackquline: [phone]  - Dr. Claudene: [phone]   In the event of inclement weather, please call our main line at [phone] for an update on the status of any delays or closures.  Dermatology Medication Tips: Please keep the boxes that topical medications come in in order to help keep track of the instructions about where and how to use these. Pharmacies typically print the medication instructions only on the boxes and not directly on the medication tubes.   If your medication is too expensive, please contact our office at [phone] option 4 or send us  a message through MyChart.  We are unable to tell what your co-pay for medications will be in advance as this is different depending on your insurance coverage. However, we may be able to find a substitute medication at lower cost or fill out paperwork to get insurance to cover a needed medication.   If a prior authorization is required to get your medication covered by your insurance company, please allow us  1-2 business days to complete this process.  Drug prices often vary depending on where the prescription is filled and some pharmacies may offer  cheaper prices.  The website www.goodrx.com contains coupons for medications through different pharmacies. The prices here do not account for what the cost may be with help from insurance (it may be cheaper with your insurance), but the website can give you the price if you did not use any insurance.  - You can print the associated coupon and take it with your prescription to the pharmacy.  - You may also stop by our office during regular business hours and pick up a GoodRx coupon card.  - If you need your prescription sent electronically to a different pharmacy, notify our office through [facility] or by phone at [phone] option 4.     Si Usted Necesita Algo Despus de Su Visita  Tambin puede enviarnos un mensaje a travs de Clinical cytogeneticist. Por lo general respondemos a los mensajes de MyChart en el transcurso de 1 a 2 das hbiles.  Para renovar recetas, por favor pida a su farmacia que se ponga en contacto con nuestra oficina. Randi lakes de fax es [place] [phone].  Si tiene un asunto urgente cuando la clnica est cerrada y que no puede esperar hasta el siguiente da hbil, puede llamar/localizar a su doctor(a) al nmero que aparece a continuacin.   Por favor, tenga en cuenta que aunque hacemos todo lo posible para estar disponibles para asuntos urgentes fuera del horario de [place], no estamos disponibles las 24 horas del da, los 7 [address]  [address] de la [place].   Si tiene un problema urgente y no puede comunicarse con nosotros, puede optar por buscar atencin mdica  en el consultorio de su doctor(a), en una clnica privada, en un centro de atencin urgente o en una sala de emergencias.  Si tiene Engineer, drilling, por favor llame inmediatamente al 911 o vaya a la sala de emergencias.  Nmeros de bper  - Dr. Hester: [phone]  - Dra. Jackquline: [phone]  - Dr. Claudene: [phone]   En caso de inclemencias del tiempo, por favor llame a landry capes principal al  [phone] para una actualizacin sobre el [place] de cualquier retraso o cierre.  Consejos para la medicacin en dermatologa: Por favor, guarde las cajas en las que vienen los medicamentos de uso tpico para ayudarle a seguir las instrucciones sobre dnde y cmo usarlos. Las farmacias generalmente imprimen las instrucciones del medicamento slo en las cajas y no directamente en los tubos del [place].   Si su medicamento es muy caro, por favor, pngase en contacto con landry rieger llamando al [phone] y presione la opcin 4 o envenos un mensaje a travs de Clinical cytogeneticist.   No podemos decirle cul ser su copago por los medicamentos por adelantado ya que esto es diferente dependiendo de la cobertura de su seguro. Sin embargo, es posible que podamos encontrar un medicamento sustituto a Audiological scientist un formulario para que el seguro cubra el medicamento que se considera necesario.   Si se requiere una autorizacin previa para que  su compaa de seguros malta su medicamento, por favor permtanos de 1 a 2 das hbiles para completar [address]  Los precios de los medicamentos varan con frecuencia dependiendo del Environmental consultant de dnde se surte la receta y alguna farmacias pueden ofrecer precios ms baratos.  El sitio web www.goodrx.com tiene cupones para medicamentos de Health and safety inspector. Los precios aqu no tienen en cuenta lo que podra costar con la ayuda del seguro (puede ser ms barato con su seguro), pero el sitio web puede darle el precio si no utiliz Tourist information centre manager.  - Puede imprimir el cupn correspondiente y llevarlo con su receta a la farmacia.  - Tambin puede pasar por nuestra oficina durante el horario de atencin regular y Education officer, museum una tarjeta de cupones de GoodRx.  - Si necesita que su receta se enve electrnicamente a una farmacia diferente, informe a nuestra oficina a travs de MyChart de [place] o por telfono llamando al [phone] y presione la opcin 4.

## 2024-08-26 LAB — SURGICAL PATHOLOGY

## 2024-08-27 ENCOUNTER — Ambulatory Visit: Payer: Self-pay

## 2024-08-27 DIAGNOSIS — G252 Other specified forms of tremor: Secondary | ICD-10-CM | POA: Diagnosis not present

## 2024-08-27 NOTE — Telephone Encounter (Signed)
 Advised pt of bx results.  Scheduled pt for surgery.  Emailed pt pre-op form./sh

## 2024-08-27 NOTE — Telephone Encounter (Signed)
-----   Message from Lauraine JAYSON Kanaris sent at 08/27/2024  1:27 PM EDT -----    1. Skin, left upper back :       WELL DIFFERENTIATED SQUAMOUS CELL CARCINOMA WITH SUPERFICIAL INFILTRATION, SEE       DESCRIPTION   Please notify patient with below plan: Excision  ----- Message ----- From: Interface, Lab In Three Zero One Sent: 08/26/2024   6:13 PM EDT To: Lauraine JAYSON Kanaris, MD

## 2024-09-29 ENCOUNTER — Encounter

## 2024-10-09 ENCOUNTER — Other Ambulatory Visit: Payer: Self-pay | Admitting: Internal Medicine

## 2024-10-09 NOTE — Telephone Encounter (Signed)
 Copied from CRM [id number]. Topic: Clinical - Medication Question >> Oct 09, 2024  4:40 PM Donee H wrote: Reason for CRM: Patient is calling requesting refill for medication buPROPion  (WELLBUTRIN  SR) 200 MG 12 hr tablet. She states she is completely out of the medication. Medication was originally prescribed by previous pc, patient would like for Angeline Laura to preferred pharmacy:  [id number] - [place], [place] - [address] AT [facility] OF [address] [place] [address] [place] [address] Phone: [phone] Fax: [phone]

## 2024-10-12 MED ORDER — BUPROPION HCL ER (SR) 200 MG PO TB12: 200.0000 mg | ORAL_TABLET | Freq: Two times a day (BID) | ORAL | 0 refills | Status: DC

## 2024-10-12 NOTE — Telephone Encounter (Signed)
 Requested medication (s) are due for refill today: routing for review  Requested medication (s) are on the active medication list: no  Last refill:  05/23/17  Future visit scheduled: yes  Notes to clinic:  historical medication     Requested Prescriptions  Pending Prescriptions Disp Refills   buPROPion  (WELLBUTRIN  SR) 200 MG 12 hr tablet      Sig: Take 1 tablet (200 mg total) by mouth 2 (two) times daily.     Psychiatry: Antidepressants - bupropion  Passed - 10/12/2024  2:02 PM      Passed - Cr in normal range and within 360 days    Creat  Date Value Ref Range Status  06/04/2024 0.88 0.50 - 1.05 mg/dL Final         Passed - AST in normal range and within 360 days    AST  Date Value Ref Range Status  06/04/2024 15 10 - 35 U/L Final         Passed - ALT in normal range and within 360 days    ALT  Date Value Ref Range Status  06/04/2024 21 6 - 29 U/L Final         Passed - Completed PHQ-2 or PHQ-9 in the last 360 days      Passed - Last BP in normal range    BP Readings from Last 1 Encounters:  08/11/24 128/84         Passed - Valid encounter within last 6 months    Recent Outpatient Visits           2 months ago Skin lesion of back   [facility] [place], Angeline ORN, NP   3 months ago Dysuria   [facility] Everlene Parris LABOR, MD   4 months ago Mixed hyperlipidemia   [facility] [place], Angeline ORN, NP       Future Appointments             In 1 week Raymund, Lauraine BROCKS, MD [facility]   In 1 week Stoioff, Glendia BROCKS, MD [facility] [place]

## 2024-10-16 ENCOUNTER — Other Ambulatory Visit: Payer: Self-pay | Admitting: Internal Medicine

## 2024-10-16 NOTE — Telephone Encounter (Signed)
 Copied from CRM [id number]. Topic: Clinical - Medication Refill >> Oct 16, 2024  4:20 PM Amy B wrote: Medication: buPROPion  (WELLBUTRIN  SR) 200 MG 12 hr tablet  Has the patient contacted their pharmacy? Yes (Agent: If no, request that the patient contact the pharmacy for the refill. If patient does not wish to contact the pharmacy document the reason why and proceed with request.) (Agent: If yes, when and what did the pharmacy advise?)  This is the patient's preferred pharmacy:   [id number] - [place], [place] - [address] [place] [address] Phone: [phone] Fax: [phone]  Is this the correct pharmacy for this prescription? Yes If no, delete pharmacy and type the correct one.   Has the prescription been filled recently? No  Is the patient out of the medication? Yes  Has the patient been seen for an appointment in the last year OR does the patient have an upcoming appointment? Yes  Can we respond through MyChart? No  Agent: Please be advised that Rx refills may take up to 3 business days. We ask that you follow-up with your pharmacy.

## 2024-10-19 NOTE — Telephone Encounter (Signed)
 Duplicate request, refilled 10/12/24.  Requested Prescriptions  Pending Prescriptions Disp Refills   buPROPion  (WELLBUTRIN  SR) 200 MG 12 hr tablet 180 tablet 0    Sig: Take 1 tablet (200 mg total) by mouth 2 (two) times daily.     Psychiatry: Antidepressants - bupropion  Passed - 10/19/2024 12:03 PM      Passed - Cr in normal range and within 360 days    Creat  Date Value Ref Range Status  06/04/2024 0.88 0.50 - 1.05 mg/dL Final         Passed - AST in normal range and within 360 days    AST  Date Value Ref Range Status  06/04/2024 15 10 - 35 U/L Final         Passed - ALT in normal range and within 360 days    ALT  Date Value Ref Range Status  06/04/2024 21 6 - 29 U/L Final         Passed - Completed PHQ-2 or PHQ-9 in the last 360 days      Passed - Last BP in normal range    BP Readings from Last 1 Encounters:  08/11/24 128/84         Passed - Valid encounter within last 6 months    Recent Outpatient Visits           2 months ago Skin lesion of back   [facility] [place], Angeline ORN, NP   3 months ago Dysuria   [facility] Everlene Parris LABOR, MD   4 months ago Mixed hyperlipidemia   [facility] [place], Angeline ORN, NP       Future Appointments             In 2 days Raymund, Lauraine BROCKS, MD [facility] [place] [facility]   In 2 weeks Stoioff, Glendia BROCKS, MD [facility] [place]

## 2024-10-21 ENCOUNTER — Ambulatory Visit

## 2024-10-23 ENCOUNTER — Ambulatory Visit: Payer: Self-pay | Admitting: Urology

## 2024-10-27 ENCOUNTER — Ambulatory Visit (INDEPENDENT_AMBULATORY_CARE_PROVIDER_SITE_OTHER)

## 2024-10-27 DIAGNOSIS — L988 Other specified disorders of the skin and subcutaneous tissue: Secondary | ICD-10-CM | POA: Diagnosis not present

## 2024-10-27 DIAGNOSIS — C44529 Squamous cell carcinoma of skin of other part of trunk: Secondary | ICD-10-CM | POA: Diagnosis not present

## 2024-10-27 DIAGNOSIS — C4492 Squamous cell carcinoma of skin, unspecified: Secondary | ICD-10-CM

## 2024-10-27 NOTE — Patient Instructions (Addendum)
 POST-OPERATIVE WOUND CARE INSTRUCTIONS  1.  Please avoid strenuous activity for 48 hours and all heavy exercise for 1 week. Take it easy for 2 weeks after your surgery. Heavy activity can cause bleeding and separation of your wound.  If your wound is on your arm, leg, or shoulder, then avoid heavy lifting, straining, or exercise with that limb for at least one week. If your wound is on the head or neck, avoid bending over 2. Please do not smoke for 3 weeks; smoking prevents healthy wound healing. 3. Keep the wound elevated as much as possible; if the wound is on your face, try to sleep in a recliner or on extra pillows (do NOT sleep face-down). 4. Typical post-op pain consists of soreness or mild tenderness usually lasting less than 48 hours.  This is often relieved with 2 extra-strength Tylenol pills every 4-6 hours.   Pain should decrease steadily for the 1st few days after your surgery.  If it does not, please call our office. Tips for pain relief:  If not relieved by tylenol alone, I recommend Ibuprofen 400mg  every 6 hours alternating with acetaminophen 650mg  every 6 hours (not to exceed 3000mg ) thus essentially taking one medicine every 3 hours, cold analgesia which is applied to the surgical site for 10 to 20 minutes, rest and site elevation. Please discuss with physician if you have kidney disease, liver disease, or peptic ulcer disease before ingesting regimen above. 5. If bleeding occurs, apply firm pressure for 20 minutes (timed by the clock). You can also use an ice-pack to decrease the swelling and bleeding.  If it persists, apply pressure for one more round of 20 minutes.  If it continues, call our office or go the nearest ER.   For surgical wounds closed with DISSOLVING stitches: 1. Please keep the original bandage in place for at least 24 hours. You can remove the large outer layer at that time and keep the thin bandage clean and dry for one week (face) or two weeks (body/arms/legs) or  until you return.  If the bandage has become soaked with blood or has loosened before this time, you may replace it with a clean bandage.  2. Usually, we will remove the inner flat bandage at your follow-up in approximately 1 week (face) or two weeks (body/arms/legs) unless otherwise noted. 3. Then follow the cleaning instructions below after all bandaging is removed.  For surgical wounds requiring STITCH REMOVAL or OPEN WOUNDS allowed to heal without stitches: Remove the original bandage in 48 hours (2 days). You may leave the smaller bandage (steri-strips) in place until it falls off by itself. 2.   It is okay to let soap and water run down onto the wound - don't scrub the wound area too vigorously 3. Then follow cleaning instructions as below.  Do not use triple antibiotic ointment, Neosporin, or hydrogen peroxide. Cleaning: Do the following every day (TWICE A DAY) for 2 weeks after bandages are removed:  remove the bandage that was placed at your follow-up.  You can wet the bandage to make it easier to remove if needed.  It is normal to have dried blood or a small amount of bleeding when the bandage is removed. If Steri-strips were used, do not peel them off (they will fall off on their own).   clean the surgical area with a vinegar/water solution   1 tablespoon of white vinegar in 1 cup of water (you can make as much of the  mixture as you  need). Cool this mixture (refrigerator).  Soak a washcloth or cotton balls/Q-tips in this solution.  Apply to wound area for  5-10 minutes once to twice a day. Gently pat dry. Crusts and scabs slow down  wound healing so you can gently use a Q-tip soaked in the vinegar solution to  remove the crusts.   apply Vaseline ointment to the wound and over any incision lines, stitches, and the surrounding area. Be generous with the Vaseline and do not let the wound become dry and crusted.  cover with a bandaid/bandage if needed, especially if you will be in a dirty or  dusty environment.  Check the wound for extensive swelling, bleeding or redness. It is normal to have some mild oozing, swelling and redness for a few days after surgery. If the wound appears  infected (increasing redness, significant warmth, extreme tenderness, thick yellow discharge), call our office.  Possible complications from surgery:  Bleeding: a small amount of blood on the bandage is normal. Significant bleeding into the bandage or beneath the stitches will look like swelling and purple discoloration. For bleeding, apply firm pressure with a cloth or bandage for 20 minutes without interruption. Repeat if bleeding continues. If it persists, please call [phone]. During nonoffice hours use the message of this number to contact the on-call dermatologist or go to the emergency room.   Infection: usually indicated by pain that increases 4 to 6 days after surgery. Mild redness, swelling, soreness are normal, but with increasing redness, pain, drainage, and swelling you should contact our office.   Sensation changes: Decreased sensation, increased sensitivity, and itching may last for 18 months. Numbness in the surgical area is expected, and it may take 12-18 months for the feeling to return to normal. During this time sensations of itching, tingling, and occasional sharp pains might be noted. These feelings are normal and will subside once the nerves in the skin have healed.  Scarring: scars mature for one year after surgery. Reducing motion and tension over the wound for the 1st month, as well as sun protection, reduces scarring.   Bruising is normal around the wound and resolves over 2 to 3 weeks.  Important tips and points: *The better your wound care, the better the final appearance of your surgical site and any resulting scar. *Although your wound may appear healed in 1-2 weeks, it takes at least 3 months for complete healing and 12 months for it to take its final appearance. *By  limiting the tension across your wound for 3 months, you will minimize any wide "spread scar" appearance.  Therefore, try to avoid any strenuous exercise or heavy lifting for at least 1 month. Wounds regain only 40% of their strength at 1 month (and only 80% at one year). *For wounds around the lips and mouth, avoid any wide opening.sorry, no biting into an apple or hamburger for a while. Try to cut up your food and take little bites. *Avoid sun exposure to your wound and scar to prevent any discoloration or prolonged redness.  You may use sunscreen after it has healed with new skin. Cover it up with Vaseline and a band-aid until new skin has appeared.  Please return as scheduled-- usually 1 week (face) and 2 weeks (body/arms/legs).  At that visit we will check to ensure the wound is healing appropriately.  We will also discuss the use of silicone sheets to be applied to the area to help reduce scar.  This is the only scar reducing product  that has some evidence to support it's use.  Mederma and vitamin E do not have good evidence to support their use.     Due to recent changes in healthcare laws, you may see results of your pathology and/or laboratory studies on MyChart before the doctors have had a chance to review them. We understand that in some cases there may be results that are confusing or concerning to you. Please understand that not all results are received at the same time and often the doctors may need to interpret multiple results in order to provide you with the best plan of care or course of treatment. Therefore, we ask that you please give us  2 business days to thoroughly review all your results before contacting the office for clarification. Should we see a critical lab result, you will be contacted sooner.   If You Need Anything After Your Visit  If you have any questions or concerns for your doctor, please call our main line at [phone] and press option 4 to reach your doctor's  medical assistant. If no one answers, please leave a voicemail as directed and we will return your call as soon as possible. Messages left after 4 pm will be answered the following business day.   You may also send us  a message via MyChart. We typically respond to MyChart messages within 1-2 business days.  For prescription refills, please ask your pharmacy to contact our office. Our fax number is [phone].  If you have an urgent issue when the clinic is closed that cannot wait until the next business day, you can page your doctor at the number below.    Please note that while we do our best to be available for urgent issues outside of office hours, we are not available 24/7.   If you have an urgent issue and are unable to reach us , you may choose to seek medical care at your doctor's office, retail clinic, urgent care center, or emergency room.  If you have a medical emergency, please immediately call 911 or go to the emergency department.  Pager Numbers  - Dr. Hester: [phone]  - Dr. Jackquline: [phone]  - Dr. Claudene: [phone]   - Dr. Raymund: [phone]  In the event of inclement weather, please call our main line at [phone] for an update on the status of any delays or closures.  Dermatology Medication Tips: Please keep the boxes that topical medications come in in order to help keep track of the instructions about where and how to use these. Pharmacies typically print the medication instructions only on the boxes and not directly on the medication tubes.   If your medication is too expensive, please contact our office at [phone] option 4 or send us  a message through MyChart.   We are unable to tell what your co-pay for medications will be in advance as this is different depending on your insurance coverage. However, we may be able to find a substitute medication at lower cost or fill out paperwork to get insurance to cover a needed medication.   If a  prior authorization is required to get your medication covered by your insurance company, please allow us  1-2 business days to complete this process.  Drug prices often vary depending on where the prescription is filled and some pharmacies may offer cheaper prices.  The website www.goodrx.com contains coupons for medications through different pharmacies. The prices here do not account for what the cost may be with help from insurance (it  may be cheaper with your insurance), but the website can give you the price if you did not use any insurance.  - You can print the associated coupon and take it with your prescription to the pharmacy.  - You may also stop by our office during regular business hours and pick up a GoodRx coupon card.  - If you need your prescription sent electronically to a different pharmacy, notify our office through [facility] or by phone at [phone] option 4.     Si Usted Necesita Algo Despus de Su Visita  Tambin puede enviarnos un mensaje a travs de Clinical cytogeneticist. Por lo general respondemos a los mensajes de MyChart en el transcurso de 1 a 2 das hbiles.  Para renovar recetas, por favor pida a su farmacia que se ponga en contacto con nuestra oficina. Randi lakes de fax es [place] [phone].  Si tiene un asunto urgente cuando la clnica est cerrada y que no puede esperar hasta el siguiente da hbil, puede llamar/localizar a su doctor(a) al nmero que aparece a continuacin.   Por favor, tenga en cuenta que aunque hacemos todo lo posible para estar disponibles para asuntos urgentes fuera del horario de [place], no estamos disponibles las 24 horas del da, los 7 [address]  [address] de la [place].   Si tiene un problema urgente y no puede comunicarse con nosotros, puede optar por buscar atencin mdica  en el consultorio de su doctor(a), en una clnica privada, en un centro de atencin urgente o en una sala de emergencias.  Si tiene Engineer, drilling, por favor llame  inmediatamente al 911 o vaya a la sala de emergencias.  Nmeros de bper  - Dr. Hester: [phone]  - Dra. Jackquline: [phone]  - Dr. Claudene: [phone]  - Dra. Kitts: [phone]  En caso de inclemencias del [place], por favor llame a nuestra lnea principal al [phone] para una actualizacin sobre el estado de cualquier retraso o cierre.  Consejos para la medicacin en dermatologa: Por favor, guarde las cajas en las que vienen los medicamentos de uso tpico para ayudarle a seguir las instrucciones sobre dnde y cmo usarlos. Las farmacias generalmente imprimen las instrucciones del medicamento slo en las cajas y no directamente en los tubos del [place].   Si su medicamento es muy caro, por favor, pngase en contacto con landry rieger llamando al [phone] y presione la opcin 4 o envenos un mensaje a travs de Clinical cytogeneticist.   No podemos decirle cul ser su copago por los medicamentos por adelantado ya que esto es diferente dependiendo de la cobertura de su seguro. Sin embargo, es posible que podamos encontrar un medicamento sustituto a Audiological scientist un formulario para que el seguro cubra el medicamento que se considera necesario.   Si se requiere una autorizacin previa para que su compaa de seguros malta su medicamento, por favor permtanos de 1 a 2 das hbiles para completar este proceso.  Los precios de los medicamentos varan con frecuencia dependiendo del Environmental consultant de dnde se surte la receta y alguna farmacias pueden ofrecer precios ms baratos.  El sitio web www.goodrx.com tiene cupones para medicamentos de Health and safety inspector. Los precios aqu no tienen en cuenta lo que podra costar con la ayuda del seguro (puede ser ms barato con su seguro), pero el sitio web puede darle el precio si no utiliz Tourist information centre manager.  - Puede imprimir el cupn correspondiente y llevarlo con su receta a la farmacia.  - Tambin puede pasar por nuestra oficina durante el  horario  de Visual merchandiser regular y Education officer, museum una tarjeta de cupones de GoodRx.  - Si necesita que su receta se enve electrnicamente a una farmacia diferente, informe a nuestra oficina a travs de MyChart de [place] o por telfono llamando al [phone] y presione la opcin 4.

## 2024-10-27 NOTE — Progress Notes (Signed)
 Subjective   Brittany Cain is a 63 y.o. female who presents for the following: Surgical excision of SCC at left upper back.  Pacemaker/Defibrillator: Denies  Allergies: Denies  Anticoagulants/Aspirin: Denies  Heart valves: Denies  Joint replacements:  Denies    . Patient is established patient   Today patient reports: SCC of left upper back.   Review of Systems:    No other skin or systemic complaints except as noted in HPI or Assessment and Plan.  The following portions of the chart were reviewed this encounter and updated as appropriate: medications, allergies, medical history  Relevant Medical History:  Personal history of non melanoma skin cancer - see medical history for full details   Objective  (SKPE) Well appearing patient in no apparent distress; mood and affect are within normal limits. Examination was performed of the: Focused Exam of: Left upper back   Examination notable for: Well healed biopsy site.    Left Upper Back Well healed biopsy site.   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 Skin excision  Lesion length (cm):  0.7 Lesion width (cm):  0.7 Margin per side (cm):  0.4 Total excision diameter (cm):  1.5 Informed consent: discussed and consent obtained   Timeout: patient name, date of birth, surgical site, and procedure verified   Procedure prep:  Patient was prepped and draped in usual sterile fashion Prep type:  Povidone-iodine Anesthesia: the lesion was anesthetized in a standard fashion   Anesthetic:  1% lidocaine  w/ epinephrine  1-100,000 buffered w/ 8.4% NaHCO3 Instrument used: #15 blade   Hemostasis achieved with: pressure and electrodesiccation   Outcome: patient tolerated procedure well with no complications   Post-procedure details: sterile dressing applied and wound care instructions given   Dressing type: petrolatum  and pressure dressing   Additional details:  Superior Tag  Skin repair Complexity:  Intermediate Final length (cm):  3 Informed consent: discussed and consent obtained   Timeout: patient name, date of birth, surgical site, and procedure verified   Procedure prep:  Patient was prepped and draped in usual sterile fashion Prep type:  Chlorhexidine  Anesthesia: the lesion was anesthetized in a standard fashion   Anesthetic:  1% lidocaine  w/ epinephrine  1-100,000 buffered w/ 8.4% NaHCO3 Reason for type of repair: reduce tension to allow closure, reduce the risk of dehiscence, infection, and necrosis, reduce subcutaneous dead space and avoid a hematoma, allow closure of the large defect and preserve normal anatomy   Undermining: edges could be approximated without difficulty   Subcutaneous layers (deep stitches):  Suture size:  3-0 Suture type: PDS (polydioxanone)   Stitches:  Buried vertical mattress Fine/surface layer approximation (top stitches):  Suture size:  4-0 Suture type: Prolene (polypropylene)   Stitches comment:  Running locked Suture removal (days):  7 Hemostasis achieved with: suture, pressure and electrodesiccation Outcome: patient tolerated procedure well with no complications   Post-procedure details: sterile dressing applied and wound care instructions given   Dressing type: petrolatum, bandage and pressure dressing    Specimen 1 - Surgical pathology Differential Diagnosis: R/O residual SCC  Check Margins: Yes  Previous pathology: [id number] Superior Tag SQUAMOUS CELL CARCINOMA OF BACK    Squamous cell carcinoma of skin -     Skin excision -     Skin repair -     Surgical pathology; Standing  Squamous cell carcinoma of back  Return to clinic: Return for suture removal in 7-10 days.  I, Emerick Ege, CMA am acting as scribe for Lauraine JAYSON Kanaris, MD.   Documentation: I have reviewed the above documentation for accuracy and completeness, and I agree with  the above.  Lauraine JAYSON Kanaris, MD

## 2024-10-29 LAB — SURGICAL PATHOLOGY

## 2024-11-02 ENCOUNTER — Ambulatory Visit: Payer: Self-pay

## 2024-11-03 ENCOUNTER — Ambulatory Visit (INDEPENDENT_AMBULATORY_CARE_PROVIDER_SITE_OTHER)

## 2024-11-03 DIAGNOSIS — L720 Epidermal cyst: Secondary | ICD-10-CM | POA: Diagnosis not present

## 2024-11-03 DIAGNOSIS — L821 Other seborrheic keratosis: Secondary | ICD-10-CM | POA: Diagnosis not present

## 2024-11-03 DIAGNOSIS — Z4802 Encounter for removal of sutures: Secondary | ICD-10-CM

## 2024-11-03 NOTE — Patient Instructions (Signed)

## 2024-11-03 NOTE — Progress Notes (Signed)
Patient informed and voiced good understanding.

## 2024-11-03 NOTE — Progress Notes (Signed)
    Subjective   Brittany Cain is a 63 y.o. female who presents for the following: suture removal. Patient is established patient   Today patient reports: Suture removal Areas of concern on the face  Review of Systems:    No other skin or systemic complaints except as noted in HPI or Assessment and Plan.  The following portions of the chart were reviewed this encounter and updated as appropriate: medications, allergies, medical history  Relevant Medical History:  Personal history of non melanoma skin cancer - see medical history for full details   Objective  (SKPE) Well appearing patient in no apparent distress; mood and affect are within normal limits. Examination was performed of the: Focused Exam of: left upper back, face   Examination notable for: Well healing excision site, - Multiple 2-3 mm white papules located on the face, - Multiple stuck-on brown, tan and grey papillated papules and plaques on the face       Assessment & Plan  (SKAP)   Encounter for Removal of Sutures - Incision site at the left upper back is clean, dry and intact - Wound cleansed, sutures removed, wound cleansed.  - Discussed pathology results showing  NO RESIDUAL SQUAMOUS CELL CARCINOMA, MARGINS FREE.  - Scars remodel for a full year. - Patient advised to call with any concerns or if they notice any new or changing lesions.  Seborrheic keratosis - Discussed diagnosis, typical course, and treatment options for this condition - Reassurance, benign, monitor - Discussed cryotherapy, deferred  - ABCDE's discussed  [place]  - Discussed the diagnosis and reassured the pt regarding the benign nature of this condition - Discussed extraction, cosmetic fee    Level of service outlined above   Patient instructions (SKPI)   Procedures, orders, diagnosis for this visit:    There are no diagnoses linked to this encounter.  Return to clinic: Return in about 6 months (around 05/03/2025) for  TBSE.  I, Emerick Ege, CMA am acting as scribe for Lauraine JAYSON Kanaris, MD.   Documentation: I have reviewed the above documentation for accuracy and completeness, and I agree with the above.  Lauraine JAYSON Kanaris, MD

## 2024-11-04 ENCOUNTER — Ambulatory Visit: Admitting: Urology

## 2024-11-10 ENCOUNTER — Other Ambulatory Visit: Payer: Self-pay | Admitting: Family Medicine

## 2024-11-10 DIAGNOSIS — Z1231 Encounter for screening mammogram for malignant neoplasm of breast: Secondary | ICD-10-CM

## 2024-11-25 ENCOUNTER — Other Ambulatory Visit: Payer: Self-pay | Admitting: Internal Medicine

## 2024-11-26 NOTE — Telephone Encounter (Signed)
 Requested Prescriptions  Pending Prescriptions Disp Refills   gabapentin  (NEURONTIN ) 300 MG capsule [Pharmacy Med Name: GABAPENTIN  300 MG CAPSULE] 90 capsule 0    Sig: TAKE 1 CAPSULE BY MOUTH EVERY DAY     Neurology: Anticonvulsants - gabapentin  Passed - 11/26/2024  3:36 PM      Passed - Cr in normal range and within 360 days    Creat  Date Value Ref Range Status  06/04/2024 0.88 0.50 - 1.05 mg/dL Final         Passed - Completed PHQ-2 or PHQ-9 in the last 360 days      Passed - Valid encounter within last 12 months    Recent Outpatient Visits           3 months ago Skin lesion of back   [facility] [place], Angeline ORN, NP   4 months ago Dysuria   [place] [facility] Everlene Parris LABOR, MD   5 months ago Mixed hyperlipidemia   [place] [facility] [place], Angeline ORN, NP       Future Appointments             In 5 months Raymund, Lauraine BROCKS, MD [facility]

## 2024-11-27 ENCOUNTER — Other Ambulatory Visit: Payer: Self-pay | Admitting: Internal Medicine

## 2024-11-27 NOTE — Telephone Encounter (Unsigned)
 Copied from CRM #[id number]. Topic: Clinical - Medication Refill >> Nov 27, 2024 10:25 AM Emylou G wrote: Medication:  atorvastatin  (LIPITOR) 80 MG tablet   Has the patient contacted their pharmacy? No (Agent: If no, request that the patient contact the pharmacy for the refill. If patient does not wish to contact the pharmacy document the reason why and proceed with request.) (Agent: If yes, when and what did the pharmacy advise?)  This is the patient's preferred pharmacy:  [id number] - [place], [place] - [address] [place] [address] Phone: [phone] Fax: [phone]  Is this the correct pharmacy for this prescription? Yes If no, delete pharmacy and type the correct one.   Has the prescription been filled recently? No  Is the patient out of the medication? Yes  Has the patient been seen for an appointment in the last year OR does the patient have an upcoming appointment? Yes  Can we respond through MyChart? No  Agent: Please be advised that Rx refills may take up to 3 business days. We ask that you follow-up with your pharmacy.

## 2024-11-30 MED ORDER — ATORVASTATIN CALCIUM 80 MG PO TABS: 80.0000 mg | ORAL_TABLET | Freq: Every day | ORAL | 1 refills | Status: AC

## 2024-11-30 NOTE — Telephone Encounter (Signed)
 Requested Prescriptions  Pending Prescriptions Disp Refills   atorvastatin  (LIPITOR) 80 MG tablet 90 tablet 3    Sig: Take 1 tablet (80 mg total) by mouth daily.     Cardiovascular:  Antilipid - Statins Failed - 11/30/2024  2:54 PM      Failed - Lipid Panel in normal range within the last 12 months    Cholesterol  Date Value Ref Range Status  06/04/2024 202 (H) <200 mg/dL Final   LDL Cholesterol (Calc)  Date Value Ref Range Status  06/04/2024 129 (H) mg/dL (calc) Final    Comment:    Reference range: <100 . Desirable range <100 mg/dL for primary prevention;   <70 mg/dL for patients with CHD or diabetic patients  with > or = 2 CHD risk factors. SABRA LDL-C is now calculated using the Martin-Hopkins  calculation, which is a validated novel method providing  better accuracy than the Friedewald equation in the  estimation of LDL-C.  Gladis APPLETHWAITE et al. SANDREA. 7986;689(80): 2061-2068  (http://education.[facility].com/faq/FAQ164)    HDL  Date Value Ref Range Status  06/04/2024 33 (L) > OR = 50 mg/dL Final   Triglycerides  Date Value Ref Range Status  06/04/2024 258 (H) <150 mg/dL Final    Comment:    . If a non-fasting specimen was collected, consider repeat triglyceride testing on a fasting specimen if clinically indicated.  Veatrice et al. J. of Clin. Lipidol. 2015;9:129-169. SABRA          Passed - Patient is not pregnant      Passed - Valid encounter within last 12 months    Recent Outpatient Visits           3 months ago Skin lesion of back   [place] [facility] [place], Angeline ORN, NP   4 months ago Dysuria   [place] [facility] Everlene Parris LABOR, MD   5 months ago Mixed hyperlipidemia   [place] [facility] [place], Angeline ORN, NP       Future Appointments             In 5 months Raymund, Lauraine BROCKS, MD [facility]

## 2024-11-30 NOTE — Telephone Encounter (Signed)
 Called pharmacy - Rx was transferred to [facility] in October.

## 2024-12-01 ENCOUNTER — Telehealth: Payer: Self-pay

## 2024-12-01 NOTE — Telephone Encounter (Signed)
 Copied from CRM [id number]. Topic: General - Other >> Dec 01, 2024  9:28 AM Burnard DEL wrote: Reason for CRM: Patient called in stating that she woke up with frequent urination,burning,and abdominal pressure. An appointment was offered to her however she would like to know if she could just come by and leave a urine sample? She stated that she has a history of UTI ,and her previous doctor is aware of this issue that she has. Please advise.

## 2024-12-01 NOTE — Telephone Encounter (Signed)
 Spoke to patient, I checked all providers schedules in the office no appointments until Thursday. Advised her to go to an urgent care to be seen.

## 2024-12-17 DIAGNOSIS — F331 Major depressive disorder, recurrent, moderate: Secondary | ICD-10-CM | POA: Insufficient documentation

## 2024-12-17 NOTE — Progress Notes (Unsigned)
 "  Subjective:    Patient ID: Brittany Cain, female    DOB: 09/07/1961, 64 y.o.   MRN: [id number]  HPI  Patient presents to clinic today for her annual exam.  Flu: 11/2023 Tetanus: COVID: Pneumovax: Prevnar: Shingrix: Zostovax: 09/2022 Pap smear: Hysterectomy Mammogram: 10/2020 Bone density: Colon screening: 07/2024 Vision screening: Dentist:  Diet: Exercise:   Review of Systems   Past Medical History:  Diagnosis Date   Anxiety    Chicken pox    Chronic back pain    Chronic kidney disease    Depression    GERD (gastroesophageal reflux disease)    History of kidney stones    Hyperlipidemia    Hypertension    Obesity    Restless leg syndrome    Squamous cell carcinoma of skin 08/25/2024   Left Upper Back, pt scheduled for excision    Current Outpatient Medications  Medication Sig Dispense Refill   acetaminophen  (TYLENOL ) 325 MG tablet Take 325-650 mg by mouth every 6 (six) hours as needed for mild pain or fever.     ARMOUR THYROID  60 MG tablet Take 60 mg by mouth daily.     atorvastatin  (LIPITOR) 80 MG tablet Take 1 tablet (80 mg total) by mouth daily. 90 tablet 1   buPROPion  (WELLBUTRIN  SR) 200 MG 12 hr tablet Take 1 tablet (200 mg total) by mouth 2 (two) times daily. 180 tablet 0   chlorthalidone (HYGROTON) 25 MG tablet Take 25 mg by mouth daily.     Cholecalciferol  (VITAMIN D3) 125 MCG (5000 UT) CAPS Take 5,000 Units by mouth daily.     clonazePAM (KLONOPIN) 0.5 MG tablet Take 0.5 mg by mouth daily.     cyanocobalamin  1000 MCG tablet Take 1,000 mcg by mouth daily.     esomeprazole  (NEXIUM ) 40 MG capsule Take 1 capsule (40 mg total) by mouth daily. 90 capsule 1   gabapentin  (NEURONTIN ) 300 MG capsule TAKE 1 CAPSULE BY MOUTH EVERY DAY 90 capsule 0   loratadine  (CLARITIN ) 10 MG tablet Take 10 mg by mouth daily.     losartan (COZAAR) 100 MG tablet Take 100 mg by mouth daily.     progesterone (PROMETRIUM) 200 MG capsule Take 200 mg by mouth at bedtime.      pyridOXINE  (VITAMIN B-6) 50 MG tablet Take 50 mg by mouth 2 (two) times daily.     rOPINIRole  (REQUIP ) 0.25 MG tablet Take 1 tablet (0.25 mg total) by mouth at bedtime. 90 tablet 1   Testosterone  (TESTOPEL ) 75 MG PLLT by implant route.     No current facility-administered medications for this visit.    Allergies[1]  Family History  Problem Relation Age of Onset   Stroke Mother    Pancreatic disease Mother    Prostate cancer Father    Lung cancer Father    Breast cancer Neg Hx     Social History   Socioeconomic History   Marital status: Married    Spouse name: Not on file   Number of children: Not on file   Years of education: Not on file   Highest education level: Not on file  Occupational History   Not on file  Tobacco Use   Smoking status: Never   Smokeless tobacco: Never  Vaping Use   Vaping status: Never Used  Substance and Sexual Activity   Alcohol use: No   Drug use: No   Sexual activity: Not Currently  Other Topics Concern   Not on file  Social History  Narrative   Not on file   Social Drivers of Health   Tobacco Use: Low Risk  (12/11/2024)   Received from [facility]   Patient History    Smoking Tobacco Use: Never    Smokeless Tobacco Use: Never    Passive Exposure: Past  Financial Resource Strain: Low Risk  (12/16/2023)   Received from [facility]   Overall Financial Resource Strain (CARDIA)    Difficulty of Paying Living Expenses: Not hard at all  Food Insecurity: No Food Insecurity (12/16/2023)   Received from [facility]   Epic    Within the past 12 months, you worried that your food would run out before you got the money to buy more.: Never true    Within the past 12 months, the food you bought just didn't last and you didn't have money to get more.: Never true  Transportation Needs: No Transportation Needs (12/16/2023)   [facility] from [facility] - Transportation     In the past 12 months, has lack of transportation kept you from medical appointments or from getting medications?: No    Lack of Transportation (Non-Medical): No  Physical Activity: Not on file  Stress: Not on file  Social Connections: Not on file  Intimate Partner Violence: Not on file  Depression (PHQ2-9): Medium Risk (08/11/2024)   Depression (PHQ2-9)    PHQ-2 Score: 5  Alcohol Screen: Not on file  Housing: Low Risk  (04/30/2024)   Received from [facility]   Epic    In the last 12 months, was there a time when you were not able to pay the mortgage or rent on time?: No    In the past 12 months, how many times have you moved where you were living?: 0    At any time in the past 12 months, were you homeless or living in a shelter (including now)?: No  Utilities: Not At Risk (12/16/2023)   Received from [facility] Utilities    Threatened with loss of utilities: No  Health Literacy: Not on file     Constitutional: Denies fever, malaise, fatigue, headache or abrupt weight changes.  HEENT: Denies eye pain, eye redness, ear pain, ringing in the ears, wax buildup, runny nose, nasal congestion, bloody nose, or sore throat. Respiratory: Denies difficulty breathing, shortness of breath, cough or sputum production.   Cardiovascular: Denies chest pain, chest tightness, palpitations or swelling in the hands or feet.  Gastrointestinal: Denies abdominal pain, bloating, constipation, diarrhea or blood in the stool.  GU: Pt reports urge incontinence. Denies frequency, pain with urination, burning sensation, blood in urine, odor or discharge. Musculoskeletal: Pt reports chronic back pain. Denies decrease in range of motion, difficulty with gait,  or joint swelling.  Skin: Denies redness, rashes, lesions or ulcercations.  Neurological: Pt reports restless legs, tremor. Denies dizziness, difficulty with memory, difficulty with speech or problems with balance  and coordination.  Psych: Pt has a history of anxiety and depression. Denies SI/HI.  No other specific complaints in a complete review of systems (except as listed in HPI above).      Objective:   Physical Exam  There were no vitals taken for this visit. Wt Readings from Last 3 Encounters:  08/11/24 238 lb 9.6 oz (108.2 kg)  07/20/24 237 lb 9.6 oz (107.8 kg)  07/08/24 247 lb 4 oz (112.2 kg)    General: Appears their  stated age, well developed, well nourished in NAD. Skin: Warm, dry and intact. No rashes, lesions or ulcerations noted. HEENT: Head: normal shape and size; Eyes: sclera white, no icterus, conjunctiva pink, PERRLA and EOMs intact; Ears: Tm's gray and intact, normal light reflex; Nose: mucosa pink and moist, septum midline; Throat/Mouth: Teeth present, mucosa pink and moist, no exudate, lesions or ulcerations noted.  Neck:  Neck supple, trachea midline. No masses, lumps or thyromegaly present.  Cardiovascular: Normal rate and rhythm. S1,S2 noted.  No murmur, rubs or gallops noted. No JVD or BLE edema. No carotid bruits noted. Pulmonary/Chest: Normal effort and positive vesicular breath sounds. No respiratory distress. No wheezes, rales or ronchi noted.  Abdomen: Soft and nontender. Normal bowel sounds. No distention or masses noted. Liver, spleen and kidneys non palpable. Musculoskeletal: Normal range of motion. No signs of joint swelling. No difficulty with gait.  Neurological: Alert and oriented. Cranial nerves II-XII grossly intact. Coordination normal.  Psychiatric: Mood and affect normal. Behavior is normal. Judgment and thought content normal.    BMET    Component Value Date/Time   NA 139 06/04/2024 1043   NA 139 04/23/2014 1234   K 3.8 06/04/2024 1043   K 3.9 04/23/2014 1234   CL 101 06/04/2024 1043   CL 107 04/23/2014 1234   CO2 29 06/04/2024 1043   CO2 30 04/23/2014 1234   GLUCOSE 102 06/04/2024 1043   GLUCOSE 90 04/23/2014 1234   BUN 9 06/04/2024 1043    BUN 13 04/23/2014 1234   CREATININE 0.88 06/04/2024 1043   CALCIUM  10.0 06/04/2024 1043   CALCIUM  9.2 04/23/2014 1234   GFRNONAA >60 02/04/2022 1552   GFRNONAA >60 04/23/2014 1234   GFRAA 50 (L) 10/31/2019 1032   GFRAA >60 04/23/2014 1234    Lipid Panel     Component Value Date/Time   CHOL 202 (H) 06/04/2024 1043   TRIG 258 (H) 06/04/2024 1043   HDL 33 (L) 06/04/2024 1043   CHOLHDL 6.1 (H) 06/04/2024 1043   LDLCALC 129 (H) 06/04/2024 1043    CBC    Component Value Date/Time   WBC 8.6 06/04/2024 1043   RBC 4.87 06/04/2024 1043   HGB 15.0 06/04/2024 1043   HGB 12.5 04/23/2014 1234   HCT 46.5 (H) 06/04/2024 1043   HCT 37.2 04/23/2014 1234   PLT 352 06/04/2024 1043   PLT 294 04/23/2014 1234   MCV 95.5 06/04/2024 1043   MCV 88 04/23/2014 1234   MCH 30.8 06/04/2024 1043   MCHC 32.3 06/04/2024 1043   RDW 12.2 06/04/2024 1043   RDW 13.2 04/23/2014 1234   LYMPHSABS 2.1 10/17/2020 0414   MONOABS 1.2 (H) 10/17/2020 0414   EOSABS 0.0 10/17/2020 0414   BASOSABS 0.0 10/17/2020 0414    Hgb A1C Lab Results  Component Value Date   HGBA1C 5.5 06/04/2024            Assessment & Plan:   Preventative Health Maintenance:  Flu She declines tetanus for financial reasons, advised her if she gets bit or cut to go get this done Encouraged her to get her covid vaccine Prevnar 20 Zostovax UTD Discussed shingrix vaccine, she will check coverage with her insurance company and schedule a visit at the pharmacy if she would like to get this done She no longer needs to screen for cervical cancer Mammogram and bone density ordered- she will call  to schedule Colon screening UTD Encouraged her to consume a balanced diet and exercise regimen Advised her to see  an eye doctor and dentist annually Will check CBC, CMET, TSH, Free T4, lipid and A1C today  RTC in 6 months for followup chronic conditions Angeline Laura, NP     [1]  Allergies Allergen Reactions   Aciphex  [Rabeprazole ]  Swelling   Bactrim [Sulfamethoxazole-Trimethoprim ] Diarrhea and Nausea Only   Ceftin [Cefuroxime Axetil] Hives   Protonix  [Pantoprazole ] Hives and Swelling   Vilazodone Hcl Hives and Other (See Comments)    Tongue swelling   "

## 2024-12-18 ENCOUNTER — Ambulatory Visit (INDEPENDENT_AMBULATORY_CARE_PROVIDER_SITE_OTHER): Payer: PRIVATE HEALTH INSURANCE | Admitting: Internal Medicine

## 2024-12-18 ENCOUNTER — Encounter: Payer: Self-pay | Admitting: Internal Medicine

## 2024-12-18 VITALS — BP 120/82 | Ht 63.0 in | Wt 233.0 lb

## 2024-12-18 DIAGNOSIS — F331 Major depressive disorder, recurrent, moderate: Secondary | ICD-10-CM

## 2024-12-18 DIAGNOSIS — E782 Mixed hyperlipidemia: Secondary | ICD-10-CM | POA: Diagnosis not present

## 2024-12-18 DIAGNOSIS — E039 Hypothyroidism, unspecified: Secondary | ICD-10-CM

## 2024-12-18 DIAGNOSIS — R739 Hyperglycemia, unspecified: Secondary | ICD-10-CM | POA: Diagnosis not present

## 2024-12-18 DIAGNOSIS — Z23 Encounter for immunization: Secondary | ICD-10-CM | POA: Diagnosis not present

## 2024-12-18 DIAGNOSIS — Z0001 Encounter for general adult medical examination with abnormal findings: Secondary | ICD-10-CM

## 2024-12-18 MED ORDER — BUPROPION HCL ER (SR) 200 MG PO TB12: 200.0000 mg | ORAL_TABLET | Freq: Two times a day (BID) | ORAL | 1 refills | Status: AC

## 2024-12-18 NOTE — Patient Instructions (Signed)

## 2024-12-18 NOTE — Assessment & Plan Note (Signed)
 Encouraged diet and exercise for weight loss ?

## 2024-12-19 LAB — COMPREHENSIVE METABOLIC PANEL WITH GFR
AG Ratio: 1.6 (calc) (ref 1.0–2.5)
ALT: 20 U/L (ref 6–29)
AST: 17 U/L (ref 10–35)
Albumin: 4 g/dL (ref 3.6–5.1)
Alkaline phosphatase (APISO): 73 U/L (ref 37–153)
BUN: 13 mg/dL (ref 7–25)
CO2: 33 mmol/L — ABNORMAL HIGH (ref 20–32)
Calcium: 9.4 mg/dL (ref 8.6–10.4)
Chloride: 98 mmol/L (ref 98–110)
Creat: 0.9 mg/dL (ref 0.50–1.05)
Globulin: 2.5 g/dL (ref 1.9–3.7)
Glucose, Bld: 87 mg/dL (ref 65–99)
Potassium: 3.9 mmol/L (ref 3.5–5.3)
Sodium: 138 mmol/L (ref 135–146)
Total Bilirubin: 1.2 mg/dL (ref 0.2–1.2)
Total Protein: 6.5 g/dL (ref 6.1–8.1)
eGFR: 72 mL/min/1.73m2

## 2024-12-19 LAB — CBC
HCT: 47.8 % — ABNORMAL HIGH (ref 35.9–46.0)
Hemoglobin: 15.4 g/dL (ref 11.7–15.5)
MCH: 29.9 pg (ref 27.0–33.0)
MCHC: 32.2 g/dL (ref 31.6–35.4)
MCV: 92.8 fL (ref 81.4–101.7)
MPV: 9.3 fL (ref 7.5–12.5)
Platelets: 401 Thousand/uL — ABNORMAL HIGH (ref 140–400)
RBC: 5.15 Million/uL — ABNORMAL HIGH (ref 3.80–5.10)
RDW: 11.9 % (ref 11.0–15.0)
WBC: 9.4 Thousand/uL (ref 3.8–10.8)

## 2024-12-19 LAB — T4, FREE: Free T4: 1.2 ng/dL (ref 0.8–1.8)

## 2024-12-19 LAB — LIPID PANEL
Cholesterol: 154 mg/dL
HDL: 40 mg/dL — ABNORMAL LOW
LDL Cholesterol (Calc): 86 mg/dL
Non-HDL Cholesterol (Calc): 114 mg/dL
Total CHOL/HDL Ratio: 3.9 (calc)
Triglycerides: 182 mg/dL — ABNORMAL HIGH

## 2024-12-19 LAB — TSH: TSH: 1.81 m[IU]/L (ref 0.40–4.50)

## 2024-12-19 LAB — HEMOGLOBIN A1C
Hgb A1c MFr Bld: 5.3 %
Mean Plasma Glucose: 105 mg/dL
eAG (mmol/L): 5.8 mmol/L

## 2024-12-21 ENCOUNTER — Ambulatory Visit: Payer: Self-pay | Admitting: Internal Medicine

## 2024-12-29 ENCOUNTER — Encounter

## 2025-01-06 ENCOUNTER — Other Ambulatory Visit: Payer: Self-pay | Admitting: Internal Medicine

## 2025-01-06 NOTE — Telephone Encounter (Signed)
 Requested Prescriptions  Pending Prescriptions Disp Refills   rOPINIRole  (REQUIP ) 0.25 MG tablet [Pharmacy Med Name: ROPINIROLE  HCL 0.25 MG TABLET] 90 tablet 1    Sig: TAKE 1 TABLET BY MOUTH AT BEDTIME.     Neurology:  Parkinsonian Agents Passed - 01/06/2025 11:41 AM      Passed - Last BP in normal range    BP Readings from Last 1 Encounters:  12/18/24 120/82         Passed - Last Heart Rate in normal range    Pulse Readings from Last 1 Encounters:  07/20/24 83         Passed - Valid encounter within last 12 months    Recent Outpatient Visits           2 weeks ago Encounter for general adult medical examination with abnormal findings   [facility] [place], Angeline ORN, NP   4 months ago Skin lesion of back   [facility] [place], Angeline ORN, NP   6 months ago Dysuria   [place] [facility] Everlene Parris LABOR, MD   7 months ago Mixed hyperlipidemia   [place] [facility] [place], Angeline ORN, NP       Future Appointments             In 3 months Raymund, Lauraine BROCKS, MD [facility]

## 2025-02-02 ENCOUNTER — Encounter

## 2025-05-05 ENCOUNTER — Ambulatory Visit

## 2025-06-22 ENCOUNTER — Ambulatory Visit: Admitting: Internal Medicine
